# Patient Record
Sex: Male | Born: 1995
Health system: Southern US, Community
[De-identification: ages and names within clinical notes are randomized; demographics above are authoritative.]

## PROBLEM LIST (undated history)

## (undated) DIAGNOSIS — I38 Endocarditis, valve unspecified: Secondary | ICD-10-CM

## (undated) HISTORY — PX: WRIST SURGERY: SHX841

---

## 2015-05-20 ENCOUNTER — Emergency Department (HOSPITAL_COMMUNITY)
Admission: EM | Admit: 2015-05-20 | Discharge: 2015-05-20 | Disposition: A | Discharge status: Home / Self Care | Payer: BLUE CROSS/BLUE SHIELD | Attending: Emergency Medicine | Admitting: Emergency Medicine

## 2015-05-20 ENCOUNTER — Emergency Department (HOSPITAL_COMMUNITY): Payer: BLUE CROSS/BLUE SHIELD

## 2015-05-20 ENCOUNTER — Encounter (HOSPITAL_COMMUNITY): Payer: Self-pay

## 2015-05-20 DIAGNOSIS — S0012XS Contusion of left eyelid and periocular area, sequela: Secondary | ICD-10-CM | POA: Diagnosis not present

## 2015-05-20 DIAGNOSIS — R519 Headache, unspecified: Secondary | ICD-10-CM

## 2015-05-20 DIAGNOSIS — S0990XS Unspecified injury of head, sequela: Secondary | ICD-10-CM | POA: Diagnosis present

## 2015-05-20 DIAGNOSIS — S098XXS Other specified injuries of head, sequela: Secondary | ICD-10-CM

## 2015-05-20 DIAGNOSIS — W228XXS Striking against or struck by other objects, sequela: Secondary | ICD-10-CM | POA: Insufficient documentation

## 2015-05-20 DIAGNOSIS — F17200 Nicotine dependence, unspecified, uncomplicated: Secondary | ICD-10-CM | POA: Diagnosis not present

## 2015-05-20 DIAGNOSIS — R51 Headache: Secondary | ICD-10-CM

## 2015-05-20 MED ORDER — HYDROCODONE-ACETAMINOPHEN 5-325 MG PO TABS: 1.0000 | ORAL_TABLET | ORAL | Status: DC | PRN

## 2015-05-20 NOTE — Discharge Instructions (Signed)
Your vital signs are well within normal limits. Your CT scan reveals some soft tissue swelling on the left temporal area, and some inflammation present, but no fracture, no dislocation, no other evidence of major trauma. Please see your primary physician or your clinic physician for assistance with any additional pain management, And for recheck.
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
## 2018-07-05 ENCOUNTER — Encounter (HOSPITAL_COMMUNITY): Payer: Self-pay

## 2018-07-05 ENCOUNTER — Inpatient Hospital Stay (HOSPITAL_COMMUNITY)
Admission: EM | Admit: 2018-07-05 | Discharge: 2018-07-06 | DRG: 872 | Discharge status: Against Medical Advice | Payer: Self-pay | Attending: Internal Medicine | Admitting: Internal Medicine

## 2018-07-05 ENCOUNTER — Other Ambulatory Visit: Payer: Self-pay

## 2018-07-05 ENCOUNTER — Emergency Department (HOSPITAL_COMMUNITY): Payer: Self-pay

## 2018-07-05 DIAGNOSIS — F1990 Other psychoactive substance use, unspecified, uncomplicated: Secondary | ICD-10-CM | POA: Diagnosis present

## 2018-07-05 DIAGNOSIS — L03114 Cellulitis of left upper limb: Secondary | ICD-10-CM | POA: Diagnosis present

## 2018-07-05 DIAGNOSIS — Z1159 Encounter for screening for other viral diseases: Secondary | ICD-10-CM

## 2018-07-05 DIAGNOSIS — F17210 Nicotine dependence, cigarettes, uncomplicated: Secondary | ICD-10-CM | POA: Diagnosis present

## 2018-07-05 DIAGNOSIS — L02414 Cutaneous abscess of left upper limb: Secondary | ICD-10-CM | POA: Diagnosis present

## 2018-07-05 DIAGNOSIS — L03119 Cellulitis of unspecified part of limb: Secondary | ICD-10-CM

## 2018-07-05 DIAGNOSIS — F1910 Other psychoactive substance abuse, uncomplicated: Secondary | ICD-10-CM | POA: Diagnosis present

## 2018-07-05 DIAGNOSIS — Z91199 Patient's noncompliance with other medical treatment and regimen due to unspecified reason: Secondary | ICD-10-CM

## 2018-07-05 DIAGNOSIS — M2540 Effusion, unspecified joint: Secondary | ICD-10-CM

## 2018-07-05 DIAGNOSIS — L02419 Cutaneous abscess of limb, unspecified: Secondary | ICD-10-CM | POA: Diagnosis present

## 2018-07-05 DIAGNOSIS — A419 Sepsis, unspecified organism: Principal | ICD-10-CM | POA: Diagnosis present

## 2018-07-05 DIAGNOSIS — F1110 Opioid abuse, uncomplicated: Secondary | ICD-10-CM | POA: Diagnosis present

## 2018-07-05 DIAGNOSIS — Z8249 Family history of ischemic heart disease and other diseases of the circulatory system: Secondary | ICD-10-CM

## 2018-07-05 DIAGNOSIS — Z720 Tobacco use: Secondary | ICD-10-CM | POA: Diagnosis present

## 2018-07-05 DIAGNOSIS — L0291 Cutaneous abscess, unspecified: Secondary | ICD-10-CM

## 2018-07-05 DIAGNOSIS — F1410 Cocaine abuse, uncomplicated: Secondary | ICD-10-CM

## 2018-07-05 DIAGNOSIS — Z9119 Patient's noncompliance with other medical treatment and regimen: Secondary | ICD-10-CM

## 2018-07-05 LAB — RAPID URINE DRUG SCREEN, HOSP PERFORMED
Amphetamines: NOT DETECTED
Barbiturates: NOT DETECTED
Benzodiazepines: NOT DETECTED
Cocaine: POSITIVE — AB
Opiates: NOT DETECTED
Tetrahydrocannabinol: NOT DETECTED

## 2018-07-05 LAB — LACTIC ACID, PLASMA
Lactic Acid, Venous: 2.1 mmol/L (ref 0.5–1.9)
Lactic Acid, Venous: 1.1 mmol/L (ref 0.5–1.9)

## 2018-07-05 LAB — PROTIME-INR
INR: 1.1 (ref 0.8–1.2)
Prothrombin Time: 14.5 seconds (ref 11.4–15.2)

## 2018-07-05 LAB — CBC WITH DIFFERENTIAL/PLATELET
Abs Immature Granulocytes: 0.01 10*3/uL (ref 0.00–0.07)
Basophils Absolute: 0 10*3/uL (ref 0.0–0.1)
Basophils Relative: 0 %
Eosinophils Absolute: 0 10*3/uL (ref 0.0–0.5)
Eosinophils Relative: 0 %
HCT: 44.6 % (ref 39.0–52.0)
Hemoglobin: 14.3 g/dL (ref 13.0–17.0)
Immature Granulocytes: 0 %
Lymphocytes Relative: 7 %
Lymphs Abs: 0.3 10*3/uL — ABNORMAL LOW (ref 0.7–4.0)
MCH: 27.4 pg (ref 26.0–34.0)
MCHC: 32.1 g/dL (ref 30.0–36.0)
MCV: 85.4 fL (ref 80.0–100.0)
Monocytes Absolute: 0.3 10*3/uL (ref 0.1–1.0)
Monocytes Relative: 6 %
Neutro Abs: 4.1 10*3/uL (ref 1.7–7.7)
Neutrophils Relative %: 87 %
Platelets: 179 10*3/uL (ref 150–400)
RBC: 5.22 MIL/uL (ref 4.22–5.81)
RDW: 15.1 % (ref 11.5–15.5)
WBC: 4.7 10*3/uL (ref 4.0–10.5)
nRBC: 0 % (ref 0.0–0.2)

## 2018-07-05 LAB — URINALYSIS, ROUTINE W REFLEX MICROSCOPIC
Bilirubin Urine: NEGATIVE
Glucose, UA: NEGATIVE mg/dL
Hgb urine dipstick: NEGATIVE
Ketones, ur: NEGATIVE mg/dL
Leukocytes,Ua: NEGATIVE
Nitrite: NEGATIVE
Protein, ur: NEGATIVE mg/dL
Specific Gravity, Urine: 1.013 (ref 1.005–1.030)
pH: 6 (ref 5.0–8.0)

## 2018-07-05 LAB — COMPREHENSIVE METABOLIC PANEL
ALT: 13 U/L (ref 0–44)
AST: 16 U/L (ref 15–41)
Albumin: 3.9 g/dL (ref 3.5–5.0)
Alkaline Phosphatase: 78 U/L (ref 38–126)
Anion gap: 12 (ref 5–15)
BUN: 9 mg/dL (ref 6–20)
CO2: 23 mmol/L (ref 22–32)
Calcium: 8.8 mg/dL — ABNORMAL LOW (ref 8.9–10.3)
Chloride: 100 mmol/L (ref 98–111)
Creatinine, Ser: 0.91 mg/dL (ref 0.61–1.24)
GFR calc Af Amer: >60 mL/min (ref >60)
GFR calc non Af Amer: >60 mL/min (ref >60)
Glucose, Bld: 125 mg/dL — ABNORMAL HIGH (ref 70–99)
Potassium: 3.4 mmol/L — ABNORMAL LOW (ref 3.5–5.1)
Sodium: 135 mmol/L (ref 135–145)
Total Bilirubin: 0.3 mg/dL (ref 0.3–1.2)
Total Protein: 7.1 g/dL (ref 6.5–8.1)

## 2018-07-05 LAB — SARS CORONAVIRUS 2 BY RT PCR (HOSPITAL ORDER, PERFORMED IN ~~LOC~~ HOSPITAL LAB): SARS Coronavirus 2: NEGATIVE

## 2018-07-05 MED ORDER — SODIUM CHLORIDE 0.9 % IV SOLN
1500.0000 mg | Freq: Once | INTRAVENOUS | Status: AC
  Administered 2018-07-05: 1500 mg via INTRAVENOUS
  Filled 2018-07-05: qty 1500

## 2018-07-05 MED ORDER — VANCOMYCIN HCL 1.25 G IV SOLR
1250.0000 mg | Freq: Two times a day (BID) | INTRAVENOUS | Status: DC
  Administered 2018-07-06: 1250 mg via INTRAVENOUS
  Filled 2018-07-05 (×7): qty 1250

## 2018-07-05 MED ORDER — SODIUM CHLORIDE 0.9 % IV BOLUS
1000.0000 mL | Freq: Once | INTRAVENOUS | Status: AC
  Administered 2018-07-05: 1000 mL via INTRAVENOUS

## 2018-07-05 MED ORDER — SODIUM CHLORIDE 0.9 % IV BOLUS: 1000.0000 mL | Freq: Once | INTRAVENOUS | Status: DC

## 2018-07-05 MED ORDER — FENTANYL CITRATE (PF) 100 MCG/2ML IJ SOLN
50.0000 ug | Freq: Once | INTRAMUSCULAR | Status: AC
  Administered 2018-07-05: 50 ug via INTRAVENOUS
  Filled 2018-07-05: qty 2

## 2018-07-05 MED ORDER — VANCOMYCIN HCL 10 G IV SOLR
INTRAVENOUS | Status: AC
  Filled 2018-07-05: qty 1500

## 2018-07-05 MED ORDER — METHOCARBAMOL 500 MG PO TABS
750.0000 mg | ORAL_TABLET | Freq: Four times a day (QID) | ORAL | Status: DC
  Administered 2018-07-06: 750 mg via ORAL
  Filled 2018-07-05: qty 2

## 2018-07-05 MED ORDER — HYDROXYZINE HCL 25 MG PO TABS
25.0000 mg | ORAL_TABLET | Freq: Three times a day (TID) | ORAL | Status: DC
  Administered 2018-07-06: 25 mg via ORAL
  Filled 2018-07-05: qty 1

## 2018-07-05 MED ORDER — GABAPENTIN 300 MG PO CAPS
300.0000 mg | ORAL_CAPSULE | Freq: Three times a day (TID) | ORAL | Status: DC
  Administered 2018-07-06: 01:00:00 300 mg via ORAL
  Filled 2018-07-05: qty 1

## 2018-07-05 MED ORDER — TRAZODONE HCL 50 MG PO TABS
100.0000 mg | ORAL_TABLET | Freq: Every day | ORAL | Status: DC
  Administered 2018-07-06: 01:00:00 100 mg via ORAL
  Filled 2018-07-05: qty 2

## 2018-07-05 MED ORDER — VANCOMYCIN HCL 1.5 G IV SOLR
1500.0000 mg | Freq: Once | INTRAVENOUS | Status: DC
  Filled 2018-07-05: qty 1500

## 2018-07-05 MED ORDER — SODIUM CHLORIDE 0.9 % IV SOLN
INTRAVENOUS | Status: DC
  Administered 2018-07-06 (×2): via INTRAVENOUS

## 2018-07-05 MED ORDER — SODIUM CHLORIDE 0.9% FLUSH: 3.0000 mL | Freq: Once | INTRAVENOUS | Status: DC

## 2018-07-05 MED ORDER — KETOROLAC TROMETHAMINE 30 MG/ML IJ SOLN
30.0000 mg | Freq: Four times a day (QID) | INTRAMUSCULAR | Status: DC
  Administered 2018-07-06 (×2): 30 mg via INTRAVENOUS
  Filled 2018-07-05 (×2): qty 1

## 2018-07-05 NOTE — ED Notes (Signed)
Pt stating that he does not want to get admitted. edp and rn's explained to pt the risks of not being admitted. Pt agreed to stay.  rn to room to collect covid swab sample pt refused. rn informed pt it is hospital policy and pt cannot be admitted without it. c-rn to room to discuss with pt. edp aware of issue

## 2018-07-05 NOTE — ED Triage Notes (Signed)
Pt presents to ED with complaints of abscess on left upper forearm x 1 week.

## 2018-07-05 NOTE — ED Notes (Signed)
Date and time results received: 07/05/18 9:36 PM  (use smartphrase ".now" to insert current time)  Test: lactic Critical Value: 2.1  Name of Provider Notified: cook   Orders Received? Or Actions Taken?:  None at this time

## 2018-07-05 NOTE — ED Notes (Signed)
At bedside pt states he does not think he wants to be admitted as discussed with EDP, discussed risks and benefits of obtaining treatment, pt reports he will stay for admission

## 2018-07-05 NOTE — ED Notes (Signed)
Called ac for abx

## 2018-07-05 NOTE — ED Provider Notes (Signed)
[facility] EMERGENCY DEPARTMENT Provider Note   CSN: [id number] Arrival date & time: 07/05/18  1956    History   Chief Complaint Chief Complaint  Patient presents with  . Abscess    HPI Timothy RoughJalen Fina is a 23 y.o. male.     Abscess left upper extremity for several days.  Patient denies intravenous drug usage.  No known health problems.  Low-grade fever.  No chills.  Severity is moderate to severe.  Palpation makes pain worse     History reviewed. No pertinent past medical history.  There are no active problems to display for this patient.   Past Surgical History:  Procedure Laterality Date  . WRIST SURGERY          Home Medications    Prior to Admission medications   Not on File    Family History No family history on file.  Social History Social History   Tobacco Use  . Smoking status: Current Every Day Smoker    Packs/day: 0.50    Types: Cigarettes  . Smokeless tobacco: Never Used  Substance Use Topics  . Alcohol use: No  . Drug use: No     Allergies   Patient has no known allergies.   Review of Systems Review of Systems  All other systems reviewed and are negative.    Physical Exam Updated Vital Signs BP 115/77   Pulse (!) 107   Temp (!) 100.4 F (38 C) (Oral)   Resp 20   Ht 6' (1.829 m)   Wt 68 kg   SpO2 99%   BMI 20.34 kg/m   Physical Exam Vitals signs and nursing note reviewed.  Constitutional:      Appearance: He is well-developed.  HENT:     Head: Normocephalic and atraumatic.  Eyes:     Conjunctiva/sclera: Conjunctivae normal.  Neck:     Musculoskeletal: Neck supple.  Cardiovascular:     Rate and Rhythm: Normal rate and regular rhythm.  Pulmonary:     Effort: Pulmonary effort is normal.     Breath sounds: Normal breath sounds.  Abdominal:     General: Bowel sounds are normal.     Palpations: Abdomen is soft.  Musculoskeletal: Normal range of motion.  Skin:    Comments: Left upper extremity: Obvious abscess  approximately 2.5 cm in diameter at the distal medial aspect of the humerus  Neurological:     Mental Status: He is alert and oriented to person, place, and time.  Psychiatric:        Behavior: Behavior normal.      ED Treatments / Results  Labs (all labs ordered are listed, but only abnormal results are displayed) Labs Reviewed  COMPREHENSIVE METABOLIC PANEL - Abnormal; Notable for the following components:      Result Value   Potassium 3.4 (*)    Glucose, Bld 125 (*)    Calcium 8.8 (*)    All other components within normal limits  LACTIC ACID, PLASMA - Abnormal; Notable for the following components:   Lactic Acid, Venous 2.1 (*)    All other components within normal limits  CBC WITH DIFFERENTIAL/PLATELET - Abnormal; Notable for the following components:   Lymphs Abs 0.3 (*)    All other components within normal limits  CULTURE, BLOOD (ROUTINE X 2)  CULTURE, BLOOD (ROUTINE X 2)  SARS CORONAVIRUS 2 (HOSPITAL ORDER, PERFORMED IN [facility])  PROTIME-INR  LACTIC ACID, PLASMA  URINALYSIS, ROUTINE W REFLEX MICROSCOPIC    EKG  None  Radiology Dg Chest 2 View  Result Date: 07/05/2018 CLINICAL DATA:  Sepsis EXAM: CHEST - 2 VIEW COMPARISON:  None. FINDINGS: The heart size and mediastinal contours are within normal limits. Both lungs are clear. The visualized skeletal structures are unremarkable. IMPRESSION: No active cardiopulmonary disease. Electronically Signed   By: Katherine Mantle M.D.   On: 07/05/2018 21:25    Procedures Procedures (including critical care time)  Medications Ordered in ED Medications  sodium chloride flush (NS) 0.9 % injection 3 mL ( Intravenous Canceled Entry 07/05/18 2101)  vancomycin (VANCOCIN) 1,500 mg in sodium chloride 0.9 % 500 mL IVPB (1,500 mg Intravenous New Bag/Given 07/05/18 2113)  sodium chloride 0.9 % bolus 1,000 mL (1,000 mLs Intravenous New Bag/Given 07/05/18 2051)  sodium chloride 0.9 % bolus 1,000 mL (1,000 mLs Intravenous  New Bag/Given 07/05/18 2051)  fentaNYL (SUBLIMAZE) injection 50 mcg (50 mcg Intravenous Given 07/05/18 2112)     Initial Impression / Assessment and Plan / ED Course  I have reviewed the triage vital signs and the nursing notes.  Pertinent labs & imaging results that were available during my care of the patient were reviewed by me and considered in my medical decision making (see chart for details).        Obvious abscess in the left upper extremity.  Patient is tachycardic and febrile.  IV fluids, IV vancomycin, admit to general medicine.  Final Clinical Impressions(s) / ED Diagnoses   Final diagnoses:  Abscess    ED Discharge Orders    None       Donnetta Hutching, MD 07/05/18 2151

## 2018-07-05 NOTE — H&P (Signed)
Patient Demographics:    Timothy Christensen, is a 23 y.o. male  MRN: [id number]   DOB - September 20, 1995  Admit Date - 07/05/2018  Outpatient Primary MD for the patient is Patient, No Pcp Per   Assessment & Plan:    Principal Problem:   Cellulitis and abscess of upper extremity Active Problems:   IVDU (intravenous drug user)/Opiates/Cocaine   Polysubstance abuse /Cocaine and Opiates   Tobacco abuse    1)Lt UE/Antecubital Area Abscess/Cellulitis----continue IV vancomycin pending MRSA PCR screen and blood culture results..... Fever 100.4 and tachycardia (HR > 120 bpm) noted, lactic acid 2.1.Marland Kitchen.-  Surgical consult for possible I&D from Dr. Lovell Sheehan requested, patient may need soft tissue ultrasound.... If blood cultures are positive patient will need echo given history of IVDU to rule out endocarditis... IV Toradol was ordered for pain control, judicious use of opiates  2)Sepsis----secondary to #1 above, aggressive IV hydration, initial lactic acid elevated at 2.1, fevers and tachycardia noted,, repeat lactic acid normalized post hydration, continue IV vancomycin pending culture results and MRSA PCR screen,  If blood cultures are positive patient will need echo given history of IVDU to rule out endocarditis  3)Polysubstance abuse--- UDS positive for cocaine.... Patient admits to IV drug use including injecting Suboxone IV..... Be judicious with opiates and benzos... Okay to give gabapentin, methocarbamol and hydroxyzine as well as trazodone to help blunt withdrawal effects  4)Tobacco abuse-----smoking cessation advised, patient not interested in quitting smoking, give nicotine patch   With History of - Reviewed by me  History reviewed. No pertinent past medical history.    Past Surgical History:  Procedure Laterality Date  .  WRIST SURGERY        Chief Complaint  Patient presents with  . Abscess      HPI:    Timothy Christensen  is a 24 y.o. male with past medical history relevant for polysubstance abuse including IVDU, cocaine and opiate abuse, tobacco abuse who presents to the ED with several days of left antecubital fossa area swelling, warmth, tenderness and increasing pain...  Patient admits to IV drug use including self injecting into his left antecubital fossa..... No injury recalled  Patient also had fevers and chills... No night sweats... No chest pains no palpitations no dizziness No URI symptoms no cough In ED... Patient is found to be tachycardic with heart rate up to the 120s, febrile with temp of 100.4,... No tachypnea.. No hypoxia.. No leukocytosis... Lactic acid is elevated at 2.1 He received IV fluid boluses per sepsis protocol.... Repeat lactic acid after IV fluids is 1.1... Blood cultures obtained.... EDP initiated IV vancomycin therapy Chest x-ray without acute findings  UDS positive for cocaine  Patient admits to IV drug use including injecting Suboxone IV.    Review of systems:    In addition to the HPI above,   A full Review of  Systems was done, all other systems reviewed are negative except as noted above in  HPI , .    Social History:  Reviewed by me    Social History   Tobacco Use  . Smoking status: Current Every Day Smoker    Packs/day: 0.50    Types: Cigarettes  . Smokeless tobacco: Never Used  Substance Use Topics  . Alcohol use: No       Family History :  Reviewed by me  HTN  Home Medications:   Prior to Admission medications   Not on File     Allergies:    No Known Allergies   Physical Exam:   Vitals  Blood pressure 110/74, pulse (!) 107, temperature (!) 100.4 F (38 C), temperature source Oral, resp. rate 20, height 6' (1.829 m), weight 68 kg, SpO2 99 %.  Physical Examination: General appearance - alert, well appearing, and in no  distress Mental status - alert, oriented to person, place, and time, patient is somewhat anxious Eyes - sclera anicteric Neck - supple, no JVD elevation , Chest - clear  to auscultation bilaterally, symmetrical air movement,  Heart - S1 and S2 normal, regular  Abdomen - soft, nontender, nondistended, no masses or organomegaly Neurological - screening mental status exam normal, neck supple without rigidity, cranial nerves II through XII intact, DTR's normal and symmetric Extremities - no pedal edema noted, intact peripheral pulses  Skin ---- left antecubital fossa cellulitis and abscess as noted-please see photos Below- Media Information   Document Information   Photos    07/05/2018 22:19  Attached To:  Hospital Encounter on 07/05/18  Source Information   Shon HaleEmokpae, Julisa Flippo, MD  [facility]    Media Information   Document Information   Photos    07/05/2018 22:19  Attached To:  Hospital Encounter on 07/05/18  Source Information   Shon HaleEmokpae, Helon Wisinski, MD  [facility]      Data Review:    CBC Recent Labs  Lab 07/05/18 2024  WBC 4.7  HGB 14.3  HCT 44.6  PLT 179  MCV 85.4  MCH 27.4  MCHC 32.1  RDW 15.1  LYMPHSABS 0.3*  MONOABS 0.3  EOSABS 0.0  BASOSABS 0.0   ------------------------------------------------------------------------------------------------------------------  Chemistries  Recent Labs  Lab 07/05/18 2024  NA 135  K 3.4*  CL 100  CO2 23  GLUCOSE 125*  BUN 9  CREATININE 0.91  CALCIUM 8.8*  AST 16  ALT 13  ALKPHOS 78  BILITOT 0.3   ------------------------------------------------------------------------------------------------------------------ estimated creatinine clearance is 119.4 mL/min (by C-G formula based on SCr of 0.91 mg/dL). ------------------------------------------------------------------------------------------------------------------ No results for input(s): TSH, T4TOTAL, T3FREE, THYROIDAB in the last 72  hours.  Invalid input(s): FREET3   Coagulation profile Recent Labs  Lab 07/05/18 2024  INR 1.1   --------------------------------------------------------------------------------------------------------------  Urinalysis    Component Value Date/Time   COLORURINE YELLOW 07/05/2018 2024   APPEARANCEUR CLEAR 07/05/2018 2024   LABSPEC 1.013 07/05/2018 2024   PHURINE 6.0 07/05/2018 2024   GLUCOSEU NEGATIVE 07/05/2018 2024   HGBUR NEGATIVE 07/05/2018 2024   BILIRUBINUR NEGATIVE 07/05/2018 2024   KETONESUR NEGATIVE 07/05/2018 2024   PROTEINUR NEGATIVE 07/05/2018 2024   NITRITE NEGATIVE 07/05/2018 2024   LEUKOCYTESUR NEGATIVE 07/05/2018 2024    ----------------------------------------------------------------------------------------------------------------   Imaging Results:    Dg Chest 2 View  Result Date: 07/05/2018 CLINICAL DATA:  Sepsis EXAM: CHEST - 2 VIEW COMPARISON:  None. FINDINGS: The heart size and mediastinal contours are within normal limits. Both lungs are clear. The visualized skeletal structures are unremarkable. IMPRESSION: No active cardiopulmonary disease. Electronically Signed   By:  Katherine Mantle M.D.   On: 07/05/2018 21:25    Radiological Exams on Admission: Dg Chest 2 View  Result Date: 07/05/2018 CLINICAL DATA:  Sepsis EXAM: CHEST - 2 VIEW COMPARISON:  None. FINDINGS: The heart size and mediastinal contours are within normal limits. Both lungs are clear. The visualized skeletal structures are unremarkable. IMPRESSION: No active cardiopulmonary disease. Electronically Signed   By: Katherine Mantle M.D.   On: 07/05/2018 21:25   DVT Prophylaxis -SCD /Heaprin AM Labs Ordered, also please review Full Orders  Family Communication: Admission, patients condition and plan of care including tests being ordered have been discussed with the patient  who indicate understanding and agree with the plan   Code Status - Full Code  Likely DC to  home  Condition    Stable  Shon Hale M.D on 07/05/2018 at 11:59 PM Go to [email or web address] -  for contact info  [facility] - Office  [phone]

## 2018-07-05 NOTE — ED Notes (Signed)
RN AC at bedside to discuss patients refusal to have covid 19 testing performed prior to admissions. Patient states he will complete antibiotics in the ED and then come back through the ED daily to get his next doses. RN [facility] attempted to discuss the importance of staying to have proper dosing per pharmacy based on lab results. Patients continue to state that he will not have Covid swab performed because "people I know say it hurts". Dr. Adriana Simas made aware of patients refusal to have Covid swab performed. Dr. Adriana Simas at bedside discussing options with patient.

## 2018-07-05 NOTE — ED Notes (Signed)
Discussed with pt the hospital policy and need for COVID test before admission as RN states pt refuses test, discussed with pt the testing procedure and what he could expect and showed pt the test swab, pt states he has heard the test hurts and he does not want it, discussed sepsis with pt and risks and benefits of staying for needed treatment, pt reports he will need additional time to think about whether he will allow test to be administered, discussed with AC-she will speak with pt soon

## 2018-07-05 NOTE — Progress Notes (Signed)
Pharmacy Antibiotic Note  Timothy Christensen is a 23 y.o. male admitted on 07/05/2018 with sepsis/abscess.  Pharmacy has been consulted for Vancomycin dosing.  Plan: Vancomycin 1250 mg IV every 12 hours.  Goal trough 15-20 mcg/mL.  Monitor labs, c/s, and vanco levels as indicated.  Height: 6' (182.9 cm) Weight: 150 lb (68 kg) IBW/kg (Calculated) : 77.6  Temp (24hrs), Avg:100.4 F (38 C), Min:100.4 F (38 C), Max:100.4 F (38 C)  Recent Labs  Lab 07/05/18 2024  WBC 4.7  CREATININE 0.91  LATICACIDVEN 2.1*    Estimated Creatinine Clearance: 119.4 mL/min (by C-G formula based on SCr of 0.91 mg/dL).    No Known Allergies  Antimicrobials this admission: Vanco 5/21 >>     Dose adjustments this admission: N/A  Microbiology results: 5/21 BCx: pending  5/21 MRSA PCR: pending  Thank you for allowing pharmacy to be a part of this patient's care.  Tad Moore 07/05/2018 10:59 PM

## 2018-07-06 ENCOUNTER — Encounter: Payer: Self-pay | Admitting: Orthopedic Surgery

## 2018-07-06 ENCOUNTER — Inpatient Hospital Stay (HOSPITAL_COMMUNITY): Payer: Self-pay

## 2018-07-06 ENCOUNTER — Encounter (HOSPITAL_COMMUNITY): Payer: Self-pay | Admitting: *Deleted

## 2018-07-06 DIAGNOSIS — L0291 Cutaneous abscess, unspecified: Secondary | ICD-10-CM

## 2018-07-06 DIAGNOSIS — F1910 Other psychoactive substance abuse, uncomplicated: Secondary | ICD-10-CM

## 2018-07-06 DIAGNOSIS — F1990 Other psychoactive substance use, unspecified, uncomplicated: Secondary | ICD-10-CM

## 2018-07-06 DIAGNOSIS — Z91199 Patient's noncompliance with other medical treatment and regimen due to unspecified reason: Secondary | ICD-10-CM

## 2018-07-06 DIAGNOSIS — Z9119 Patient's noncompliance with other medical treatment and regimen: Secondary | ICD-10-CM

## 2018-07-06 DIAGNOSIS — Z720 Tobacco use: Secondary | ICD-10-CM

## 2018-07-06 DIAGNOSIS — F1410 Cocaine abuse, uncomplicated: Secondary | ICD-10-CM

## 2018-07-06 LAB — CBC
HCT: 42.4 % (ref 39.0–52.0)
Hemoglobin: 13.5 g/dL (ref 13.0–17.0)
MCH: 27.7 pg (ref 26.0–34.0)
MCHC: 31.8 g/dL (ref 30.0–36.0)
MCV: 87.1 fL (ref 80.0–100.0)
Platelets: 156 10*3/uL (ref 150–400)
RBC: 4.87 MIL/uL (ref 4.22–5.81)
RDW: 15.3 % (ref 11.5–15.5)
WBC: 3.7 10*3/uL — ABNORMAL LOW (ref 4.0–10.5)
nRBC: 0 % (ref 0.0–0.2)

## 2018-07-06 LAB — BASIC METABOLIC PANEL
Anion gap: 5 (ref 5–15)
BUN: 8 mg/dL (ref 6–20)
CO2: 26 mmol/L (ref 22–32)
Calcium: 8.2 mg/dL — ABNORMAL LOW (ref 8.9–10.3)
Chloride: 110 mmol/L (ref 98–111)
Creatinine, Ser: 0.74 mg/dL (ref 0.61–1.24)
GFR calc Af Amer: >60 mL/min (ref >60)
GFR calc non Af Amer: >60 mL/min (ref >60)
Glucose, Bld: 103 mg/dL — ABNORMAL HIGH (ref 70–99)
Potassium: 3.9 mmol/L (ref 3.5–5.1)
Sodium: 141 mmol/L (ref 135–145)

## 2018-07-06 LAB — GLUCOSE, CAPILLARY: Glucose-Capillary: 125 mg/dL — ABNORMAL HIGH (ref 70–99)

## 2018-07-06 MED ORDER — HEPARIN SODIUM (PORCINE) 5000 UNIT/ML IJ SOLN: 5000.0000 [IU] | Freq: Three times a day (TID) | INTRAMUSCULAR | Status: DC

## 2018-07-06 MED ORDER — POLYETHYLENE GLYCOL 3350 17 G PO PACK: 17.0000 g | PACK | Freq: Every day | ORAL | Status: DC | PRN

## 2018-07-06 MED ORDER — ONDANSETRON HCL 4 MG/2ML IJ SOLN: 4.0000 mg | Freq: Four times a day (QID) | INTRAMUSCULAR | Status: DC | PRN

## 2018-07-06 MED ORDER — SODIUM CHLORIDE 0.9 % IV SOLN: 250.0000 mL | INTRAVENOUS | Status: DC | PRN

## 2018-07-06 MED ORDER — HYDROXYZINE HCL 50 MG/ML IM SOLN: 50.0000 mg | Freq: Once | INTRAMUSCULAR | Status: DC

## 2018-07-06 MED ORDER — DOXYCYCLINE HYCLATE 100 MG PO CAPS: 100.0000 mg | ORAL_CAPSULE | Freq: Two times a day (BID) | ORAL | 0 refills | Status: DC

## 2018-07-06 MED ORDER — SODIUM CHLORIDE 0.9% FLUSH: 3.0000 mL | Freq: Two times a day (BID) | INTRAVENOUS | Status: DC

## 2018-07-06 MED ORDER — OXYCODONE HCL 5 MG PO TABS
10.0000 mg | ORAL_TABLET | ORAL | Status: DC | PRN
  Administered 2018-07-06 (×2): 10 mg via ORAL
  Filled 2018-07-06 (×2): qty 2

## 2018-07-06 MED ORDER — TRAZODONE HCL 50 MG PO TABS: 50.0000 mg | ORAL_TABLET | Freq: Every evening | ORAL | Status: DC | PRN

## 2018-07-06 MED ORDER — FENTANYL CITRATE (PF) 100 MCG/2ML IJ SOLN
50.0000 ug | INTRAMUSCULAR | Status: DC | PRN
  Administered 2018-07-06: 50 ug via INTRAVENOUS
  Filled 2018-07-06: qty 2

## 2018-07-06 MED ORDER — ACETAMINOPHEN 650 MG RE SUPP: 650.0000 mg | Freq: Four times a day (QID) | RECTAL | Status: DC | PRN

## 2018-07-06 MED ORDER — SODIUM CHLORIDE 0.9% FLUSH: 3.0000 mL | INTRAVENOUS | Status: DC | PRN

## 2018-07-06 MED ORDER — NICOTINE 14 MG/24HR TD PT24
14.0000 mg | MEDICATED_PATCH | Freq: Every day | TRANSDERMAL | Status: DC
  Administered 2018-07-06: 14 mg via TRANSDERMAL
  Filled 2018-07-06 (×2): qty 1

## 2018-07-06 MED ORDER — ALBUTEROL SULFATE (2.5 MG/3ML) 0.083% IN NEBU: 2.5000 mg | INHALATION_SOLUTION | RESPIRATORY_TRACT | Status: DC | PRN

## 2018-07-06 MED ORDER — ONDANSETRON HCL 4 MG PO TABS: 4.0000 mg | ORAL_TABLET | Freq: Four times a day (QID) | ORAL | Status: DC | PRN

## 2018-07-06 MED ORDER — ACETAMINOPHEN 325 MG PO TABS: 650.0000 mg | ORAL_TABLET | Freq: Four times a day (QID) | ORAL | Status: DC | PRN

## 2018-07-06 NOTE — Plan of Care (Signed)
progressing 

## 2018-07-06 NOTE — Discharge Summary (Signed)
Physician Discharge Summary  Timothy Christensen ZOX:[id number] DOB: 13-Jan-1996 DOA: 07/05/2018  PCP: Patient, No Pcp Per  Admit date: 07/05/2018 Discharge date: 07/06/2018  Admitted From: home Disposition:  AGAINST MEDICAL ADVICE   Discharge Condition: AGAINST MEDICAL ADVICE CODE STATUS: FULL    Brief/Interim Summary: 23 year old male with a history of polysubstance abuse including cocaine, tobacco, and buprenorphine presenting with 2 to 3-day history of worsening erythema, edema, and pain in his left antecubital fossa.  The patient states that he last injected intravenous drugs approximately 2 to 3 weeks prior to this admission.  He states that he last snorted cocaine approximately weeks prior to this admission.  He initially noted a knot in his left antecubital fossa approximately 2 weeks prior to this admission which has gradually increased in size.  He has had some subjective fevers and chills but denies any headache, neck pain, chest pain, shortness breath, coughing, nausea, vomiting, diarrhea, domino pain. In the emergency department, the patient temperature up to 100.4 F.  He was hemodynamically stable saturating 100% room air.  BMP, CBC, and LFTs were essentially unremarkable.  Urine drug screen was positive for cocaine. The patient was started on vancomycin IV.  The patient was started on intravenous fentanyl for pain control.  Hand surgery was consulted to assist.  During the hospitalization, the patient was belligerent and derogatory to the nursing staff as well as myself.  He felt that he was not getting the appropriate attention nor the proper medical care.  Ultimately, the patient decided to leave AGAINST MEDICAL ADVICE. In speaking with Timothy Christensen, Timothy Christensen has demonstrated the ability to understand his medical condition(s) which include cellulitis and abscess of left upper extremity.  Timothy Christensen has demonstrated the ability to appreciate how treatment for cellulitis and abscess of  left upper extremity will be beneficial.   Timothy Christensen has also demonstrated the ability to understand and appreciate how refusal of treatement for cellulitis and abscess of left upper extremity could result in harm, repeat hospitalization, and possibly death.  Timothy Christensen demonstrates the ability to reason through the risks and benefits of the proposed treatment.  Finally, Timothy Christensen is able to clearly communicate his/her choice.   Discharge Diagnoses:  Left upper extremity abscess/cellulitis -Concerned about fluctuant area in the left antecubital fossa -Consulted hand surgery, Dr. Romeo Apple -Spoke with Dr. Romeo Apple who requested to make the patient n.p.o. for possible surgery -Continue vancomycin -pt decided to leave against medical advice before MRI could be performed -Rx for doxycycline was sent to his pharmacy of record prior to patient leaving  Sepsis -Present at the time of admission -Patient presented with fever, tachycardia, and elevated lactic acid -Source of cellulitis -Lactic acid peaked 2.1 -Continue IV fluids -Follow blood cultures  Polysubstance abuse -Including cocaine, tobacco, and buprenorphine  Tobacco abuse -I have discussed tobacco cessation with the patient.  I have counseled the patient regarding the negative impacts of continued tobacco use including but not limited to lung cancer, COPD, and cardiovascular disease.  I have discussed alternatives to tobacco and modalities that may help facilitate tobacco cessation including but not limited to biofeedback, hypnosis, and medications.  Total time spent with tobacco counseling was 4 minutes.  Noncompliance with medical therapy -I discussed the medical plan with the patient including continued intravenous antibiotics and consulting hand surgery and pain control -I explained to the patient that I spoke with the hand surgeon, Dr. Romeo Apple who will see the patient later today and requested the patient be n.p.o. for  possible surgery -The patient adamantly states, " if you cannot tell me that there will definitely be surgery, I am going to eat.  I am not going to wait until this afternoon.  By that time, the food will be out of my stomach.  I have had surgery done before, and this is not real surgery.  They are just going to numb me up and cut and drain it so I do not understand why I cannot eat." -I kindly explained to the patient that I am not the surgeon and the decision for any type of invasive procedure would be up to the surgeon after he evaluates the patient -Patient further states, "I'm not going to wait 4 or 5 or 6 hours to eat if you are only telling me that I 'might' go to surgery and it is not a definite." -I further explained to the patient that I understand that it is a difficult situation to remain npo, but we all work together as a team and we want to get him better as fast as we can and eating may delay any type of procedure.  I assured him that he may be allowed to eat and drink once Dr. Romeo AppleHarrison evaluates him and further clarifies the need for any invasive procedure. -patient then states, "I don't care what you say.  I'm going to eat."    Discharge Instructions   Allergies as of 07/06/2018   No Known Allergies     Medication List    TAKE these medications   doxycycline 100 MG capsule Commonly known as:  Vibramycin Take 1 capsule (100 mg total) by mouth 2 (two) times daily.       No Known Allergies  Consultations:  Hand surgery   Procedures/Studies: Dg Chest 2 View  Result Date: 07/05/2018 CLINICAL DATA:  Sepsis EXAM: CHEST - 2 VIEW COMPARISON:  None. FINDINGS: The heart size and mediastinal contours are within normal limits. Both lungs are clear. The visualized skeletal structures are unremarkable. IMPRESSION: No active cardiopulmonary disease. Electronically Signed   By: Katherine Mantlehristopher  Green M.D.   On: 07/05/2018 21:25         Discharge Exam: Vitals:   07/06/18  0019 07/06/18 1015  BP: 122/82   Pulse: 86   Resp: 16   Temp: 98.8 F (37.1 C)   SpO2: 100% 99%   Vitals:   07/05/18 2330 07/06/18 0019 07/06/18 0020 07/06/18 1015  BP: 105/70 122/82    Pulse:  86    Resp: 18 16    Temp:  98.8 F (37.1 C)    TempSrc:  Oral    SpO2: 100% 100%  99%  Weight:   68 kg   Height:   6' (1.829 m)     General: Pt is alert, awake, not in acute distress Cardiovascular: RRR, S1/S2 +, no rubs, no gallops Respiratory: CTA bilaterally, no wheezing, no rhonchi Abdominal: Soft, NT, ND, bowel sounds +    The results of significant diagnostics from this hospitalization (including imaging, microbiology, ancillary and laboratory) are listed below for reference.    Significant Diagnostic Studies: Dg Chest 2 View  Result Date: 07/05/2018 CLINICAL DATA:  Sepsis EXAM: CHEST - 2 VIEW COMPARISON:  None. FINDINGS: The heart size and mediastinal contours are within normal limits. Both lungs are clear. The visualized skeletal structures are unremarkable. IMPRESSION: No active cardiopulmonary disease. Electronically Signed   By: Katherine Mantlehristopher  Green M.D.   On: 07/05/2018 21:25     Microbiology: Recent Results (from  the past 240 hour(s))  Culture, blood (Routine x 2)     Status: None (Preliminary result)   Collection Time: 07/05/18  8:24 PM  Result Value Ref Range Status   Specimen Description BLOOD  Final   Special Requests NONE  Final   Culture   Final    NO GROWTH < 12 HOURS Performed at [facility], [address], [place], [place] [address]    Report Status PENDING  Incomplete  Culture, blood (Routine x 2)     Status: None (Preliminary result)   Collection Time: 07/05/18  8:29 PM  Result Value Ref Range Status   Specimen Description BLOOD  Final   Special Requests NONE  Final   Culture   Final    NO GROWTH < 12 HOURS Performed at [facility], [address] [place], [place] [address]    Report Status PENDING  Incomplete  SARS Coronavirus 2 (CEPHEID -  Performed in [facility] hospital lab), Hosp Order     Status: None   Collection Time: 07/05/18  9:01 PM  Result Value Ref Range Status   SARS Coronavirus 2 NEGATIVE NEGATIVE Final    Comment: (NOTE) If result is NEGATIVE SARS-CoV-2 target nucleic acids are NOT DETECTED. The SARS-CoV-2 RNA is generally detectable in upper and lower  respiratory specimens during the acute phase of infection. The lowest  concentration of SARS-CoV-2 viral copies this assay can detect is 250  copies / mL. A negative result does not preclude SARS-CoV-2 infection  and should not be used as the sole basis for treatment or other  patient management decisions.  A negative result may occur with  improper specimen collection / handling, submission of specimen other  than nasopharyngeal swab, presence of viral mutation(s) within the  areas targeted by this assay, and inadequate number of viral copies  (<250 copies / mL). A negative result must be combined with clinical  observations, patient history, and epidemiological information. If result is POSITIVE SARS-CoV-2 target nucleic acids are DETECTED. The SARS-CoV-2 RNA is generally detectable in upper and lower  respiratory specimens dur ing the acute phase of infection.  Positive  results are indicative of active infection with SARS-CoV-2.  Clinical  correlation with patient history and other diagnostic information is  necessary to determine patient infection status.  Positive results do  not rule out bacterial infection or co-infection with other viruses. If result is PRESUMPTIVE POSTIVE SARS-CoV-2 nucleic acids MAY BE PRESENT.   A presumptive positive result was obtained on the submitted specimen  and confirmed on repeat testing.  While 2019 novel coronavirus  (SARS-CoV-2) nucleic acids may be present in the submitted sample  additional confirmatory testing may be necessary for epidemiological  and / or clinical management purposes  to differentiate between   SARS-CoV-2 and other Sarbecovirus currently known to infect humans.  If clinically indicated additional testing with an alternate test  methodology [phone]) is advised. The SARS-CoV-2 RNA is generally  detectable in upper and lower respiratory sp ecimens during the acute  phase of infection. The expected result is Negative. Fact Sheet for Patients:  BoilerBrush.com.cy Fact Sheet for Healthcare Providers: https://pope.com/ This test is not yet approved or cleared by the [place] FDA and has been authorized for detection and/or diagnosis of SARS-CoV-2 by FDA under an Emergency Use Authorization (EUA).  This EUA will remain in effect (meaning this test can be used) for the duration of the COVID-19 declaration under Section 564(b)(1) of the Act, 21 U.S.C. section 360bbb-3(b)(1),  unless the authorization is terminated or revoked sooner. Performed at [facility], [address] [place], [place] [address]      Labs: Basic Metabolic Panel: Recent Labs  Lab 07/05/18 2024 07/06/18 0535  NA 135 141  K 3.4* 3.9  CL 100 110  CO2 23 26  GLUCOSE 125* 103*  BUN 9 8  CREATININE 0.91 0.74  CALCIUM 8.8* 8.2*   Liver Function Tests: Recent Labs  Lab 07/05/18 2024  AST 16  ALT 13  ALKPHOS 78  BILITOT 0.3  PROT 7.1  ALBUMIN 3.9   No results for input(s): LIPASE, AMYLASE in the last 168 hours. No results for input(s): AMMONIA in the last 168 hours. CBC: Recent Labs  Lab 07/05/18 2024 07/06/18 0535  WBC 4.7 3.7*  NEUTROABS 4.1  --   HGB 14.3 13.5  HCT 44.6 42.4  MCV 85.4 87.1  PLT 179 156   Cardiac Enzymes: No results for input(s): CKTOTAL, CKMB, CKMBINDEX, TROPONINI in the last 168 hours. BNP: Invalid input(s): POCBNP CBG: Recent Labs  Lab 07/06/18 0716  GLUCAP 125*    Time coordinating discharge:  36 minutes  Signed:  Catarina Hartshorn, DO [facility] Pager: [phone] 07/06/2018, 3:25 PM

## 2018-07-06 NOTE — Progress Notes (Addendum)
Patient ID: Timothy Christensen, male   DOB: [birth year], 23 y.o.   MRN: [id number] Brief History:  23 year old male with a history of polysubstance abuse including cocaine, tobacco, and buprenorphine presenting with 2 to 3-day history of worsening erythema, edema, and pain in his left antecubital fossa.  The patient states that he last injected intravenous drugs approximately 2 to 3 weeks prior to this admission.  He states that he last snorted cocaine approximately weeks prior to this admission.  He initially noted a knot in his left antecubital fossa approximately 2 weeks prior to this admission which has gradually increased in size.  He has had some subjective fevers and chills but denies any headache, neck pain, chest pain, shortness breath, coughing, nausea, vomiting, diarrhea, domino pain. In the emergency department, the patient temperature up to 100.4 F.  He was hemodynamically stable saturating 100% room air.  BMP, CBC, and LFTs were essentially unremarkable.  Urine drug screen was positive for cocaine.   Examination of the elbow shows an abscess at the distal part of the upper arm just above the elbow crease  Swollen erythematous tender lymph nodes seem normal to palpation  Notes indicate positive cocaine drug screen  I am going to get an MRI with and without contrast to see the depth of this mass, seems superficial.  No surgery will be done today

## 2018-07-06 NOTE — Consult Note (Signed)
Reason for Consult: left elbow/arm mass Referring Physician: Dr Onalee Hua Tat   Timothy Christensen is an 23 y.o. male.  HPI: 87 year old IV drug abuser use drugs 2 to 3 weeks ago noticed a small lump in his arm just above the left elbow crease progressively increased in size started having pain came to the emergency room with pain decreased range of motion fever occasional chills  Taken from the medical record Brief History:  23 year old male with a history of polysubstance abuse including cocaine, tobacco, and buprenorphine presenting with 2 to 3-day history of worsening erythema, edema, and pain in his left antecubital fossa.  The patient states that he last injected intravenous drugs approximately 2 to 3 weeks prior to this admission.  He states that he last snorted cocaine approximately weeks prior to this admission.  He initially noted a knot in his left antecubital fossa approximately 2 weeks prior to this admission which has gradually increased in size.  He has had some subjective fevers and chills but denies any headache, neck pain, chest pain, shortness breath, coughing, nausea, vomiting, diarrhea, domino pain. In the emergency department, the patient temperature up to 100.4 F.  He was hemodynamically stable saturating 100% room air.  BMP, CBC, and LFTs were essentially unremarkable.  Urine drug screen was positive for cocaine.   History reviewed. No pertinent past medical history.  Denies any major medical problems other than his drug abuse  Past Surgical History:  Procedure Laterality Date  . WRIST SURGERY      History reviewed. No pertinent family history.  Social History:  reports that he has been smoking cigarettes. He has been smoking about 0.50 packs per day. He has never used smokeless tobacco. He reports that he does not drink alcohol or use drugs.  Allergies: No Known Allergies    Results for orders placed or performed during the hospital encounter of 07/05/18 (from the past 48  hour(s))  Comprehensive metabolic panel     Status: Abnormal   Collection Time: 07/05/18  8:24 PM  Result Value Ref Range   Sodium 135 135 - 145 mmol/L   Potassium 3.4 (L) 3.5 - 5.1 mmol/L   Chloride 100 98 - 111 mmol/L   CO2 23 22 - 32 mmol/L   Glucose, Bld 125 (H) 70 - 99 mg/dL   BUN 9 6 - 20 mg/dL   Creatinine, Ser 1.61 0.61 - 1.24 mg/dL   Calcium 8.8 (L) 8.9 - 10.3 mg/dL   Total Protein 7.1 6.5 - 8.1 g/dL   Albumin 3.9 3.5 - 5.0 g/dL   AST 16 15 - 41 U/L   ALT 13 0 - 44 U/L   Alkaline Phosphatase 78 38 - 126 U/L   Total Bilirubin 0.3 0.3 - 1.2 mg/dL   GFR calc non Af Amer >60 >60 mL/min   GFR calc Af Amer >60 >60 mL/min   Anion gap 12 5 - 15    Comment: Performed at [facility], [address] [place], [place] [address]  Lactic acid, plasma     Status: Abnormal   Collection Time: 07/05/18  8:24 PM  Result Value Ref Range   Lactic Acid, Venous 2.1 (HH) 0.5 - 1.9 mmol/L    Comment: CRITICAL RESULT CALLED TO, READ BACK BY AND VERIFIED WITH: NICHOLS,K ON 07/05/18 AT 2130 BY LOY,C Performed at [facility], [address] [place], [place] [address]   CBC with Differential     Status: Abnormal   Collection Time: 07/05/18  8:24 PM  Result Value Ref Range   WBC 4.7 4.0 - 10.5 K/uL   RBC 5.22 4.22 - 5.81 MIL/uL   Hemoglobin 14.3 13.0 - 17.0 g/dL   HCT 82.5 18.9 - 84.2 %   MCV 85.4 80.0 - 100.0 fL   MCH 27.4 26.0 - 34.0 pg   MCHC 32.1 30.0 - 36.0 g/dL   RDW 10.3 12.8 - 11.8 %   Platelets 179 150 - 400 K/uL   nRBC 0.0 0.0 - 0.2 %   Neutrophils Relative % 87 %   Neutro Abs 4.1 1.7 - 7.7 K/uL   Lymphocytes Relative 7 %   Lymphs Abs 0.3 (L) 0.7 - 4.0 K/uL   Monocytes Relative 6 %   Monocytes Absolute 0.3 0.1 - 1.0 K/uL   Eosinophils Relative 0 %   Eosinophils Absolute 0.0 0.0 - 0.5 K/uL   Basophils Relative 0 %   Basophils Absolute 0.0 0.0 - 0.1 K/uL   Immature Granulocytes 0 %   Abs Immature Granulocytes 0.01 0.00 - 0.07 K/uL    Comment: Performed at [facility],  [address] [place], [place] [address]  Protime-INR     Status: None   Collection Time: 07/05/18  8:24 PM  Result Value Ref Range   Prothrombin Time 14.5 11.4 - 15.2 seconds   INR 1.1 0.8 - 1.2    Comment: (NOTE) INR goal varies based on device and disease states. Performed at [facility], [address], [place], [place] [address]   Culture, blood (Routine x 2)     Status: None (Preliminary result)   Collection Time: 07/05/18  8:24 PM  Result Value Ref Range   Specimen Description BLOOD    Special Requests NONE    Culture      NO GROWTH < 12 HOURS Performed at [facility], [address] [place], [place] [address]    Report Status PENDING   Urinalysis, Routine w reflex microscopic     Status: None   Collection Time: 07/05/18  8:24 PM  Result Value Ref Range   Color, Urine YELLOW YELLOW   APPearance CLEAR CLEAR   Specific Gravity, Urine 1.013 1.005 - 1.030   pH 6.0 5.0 - 8.0   Glucose, UA NEGATIVE NEGATIVE mg/dL   Hgb urine dipstick NEGATIVE NEGATIVE   Bilirubin Urine NEGATIVE NEGATIVE   Ketones, ur NEGATIVE NEGATIVE mg/dL   Protein, ur NEGATIVE NEGATIVE mg/dL   Nitrite NEGATIVE NEGATIVE   Leukocytes,Ua NEGATIVE NEGATIVE    Comment: Performed at [facility], [address] [place], [place] [address]  Culture, blood (Routine x 2)     Status: None (Preliminary result)   Collection Time: 07/05/18  8:29 PM  Result Value Ref Range   Specimen Description BLOOD    Special Requests NONE    Culture      NO GROWTH < 12 HOURS Performed at [facility], [address] [place], [place] [address]    Report Status PENDING   SARS Coronavirus 2 (CEPHEID - Performed in [facility] hospital lab), Hosp Order     Status: None   Collection Time: 07/05/18  9:01 PM  Result Value Ref Range   SARS Coronavirus 2 NEGATIVE NEGATIVE    Comment: (NOTE) If result is NEGATIVE SARS-CoV-2 target nucleic acids are NOT DETECTED. The SARS-CoV-2 RNA is generally detectable in upper and lower  respiratory  specimens during the acute phase of infection. The lowest  concentration of SARS-CoV-2 viral copies this assay can detect is 250  copies /  mL. A negative result does not preclude SARS-CoV-2 infection  and should not be used as the sole basis for treatment or other  patient management decisions.  A negative result may occur with  improper specimen collection / handling, submission of specimen other  than nasopharyngeal swab, presence of viral mutation(s) within the  areas targeted by this assay, and inadequate number of viral copies  (<250 copies / mL). A negative result must be combined with clinical  observations, patient history, and epidemiological information. If result is POSITIVE SARS-CoV-2 target nucleic acids are DETECTED. The SARS-CoV-2 RNA is generally detectable in upper and lower  respiratory specimens dur ing the acute phase of infection.  Positive  results are indicative of active infection with SARS-CoV-2.  Clinical  correlation with patient history and other diagnostic information is  necessary to determine patient infection status.  Positive results do  not rule out bacterial infection or co-infection with other viruses. If result is PRESUMPTIVE POSTIVE SARS-CoV-2 nucleic acids MAY BE PRESENT.   A presumptive positive result was obtained on the submitted specimen  and confirmed on repeat testing.  While 2019 novel coronavirus  (SARS-CoV-2) nucleic acids may be present in the submitted sample  additional confirmatory testing may be necessary for epidemiological  and / or clinical management purposes  to differentiate between  SARS-CoV-2 and other Sarbecovirus currently known to infect humans.  If clinically indicated additional testing with an alternate test  methodology [phone]([id number]) is advised. The SARS-CoV-2 RNA is generally  detectable in upper and lower respiratory sp ecimens during the acute  phase of infection. The expected result is Negative. Fact Sheet for  Patients:  BoilerBrush.com.cyhttps://www.fda.gov/media/136312/download Fact Sheet for Healthcare Providers: https://pope.com/https://www.fda.gov/media/136313/download This test is not yet approved or cleared by the [place] FDA and has been authorized for detection and/or diagnosis of SARS-CoV-2 by FDA under an Emergency Use Authorization (EUA).  This EUA will remain in effect (meaning this test can be used) for the duration of the COVID-19 declaration under Section 564(b)(1) of the Act, 21 U.S.C. section 360bbb-3(b)(1), unless the authorization is terminated or revoked sooner. Performed at [facility], [address] [place], [place] [address]   Urine rapid drug screen (hosp performed)     Status: Abnormal   Collection Time: 07/05/18 10:08 PM  Result Value Ref Range   Opiates NONE DETECTED NONE DETECTED   Cocaine POSITIVE (A) NONE DETECTED   Benzodiazepines NONE DETECTED NONE DETECTED   Amphetamines NONE DETECTED NONE DETECTED   Tetrahydrocannabinol NONE DETECTED NONE DETECTED   Barbiturates NONE DETECTED NONE DETECTED    Comment: (NOTE) DRUG SCREEN FOR MEDICAL PURPOSES ONLY.  IF CONFIRMATION IS NEEDED FOR ANY PURPOSE, NOTIFY LAB WITHIN 5 DAYS. LOWEST DETECTABLE LIMITS FOR URINE DRUG SCREEN Drug Class                     Cutoff (ng/mL) Amphetamine and metabolites    1000 Barbiturate and metabolites    200 Benzodiazepine                 200 Tricyclics and metabolites     300 Opiates and metabolites        300 Cocaine and metabolites        300 THC                            50 Performed at [facility], [address] [place], [place] [address]   Lactic acid, plasma  Status: None   Collection Time: 07/05/18 10:53 PM  Result Value Ref Range   Lactic Acid, Venous 1.1 0.5 - 1.9 mmol/L    Comment: Performed at [facility], [address], [place], [place] [address]  Basic metabolic panel     Status: Abnormal   Collection Time: 07/06/18  5:35 AM  Result Value Ref Range   Sodium 141 135 - 145 mmol/L    Potassium 3.9 3.5 - 5.1 mmol/L   Chloride 110 98 - 111 mmol/L   CO2 26 22 - 32 mmol/L   Glucose, Bld 103 (H) 70 - 99 mg/dL   BUN 8 6 - 20 mg/dL   Creatinine, Ser 6.04 0.61 - 1.24 mg/dL   Calcium 8.2 (L) 8.9 - 10.3 mg/dL   GFR calc non Af Amer >60 >60 mL/min   GFR calc Af Amer >60 >60 mL/min   Anion gap 5 5 - 15    Comment: Performed at [facility], [address] [place], [place] [address]  CBC     Status: Abnormal   Collection Time: 07/06/18  5:35 AM  Result Value Ref Range   WBC 3.7 (L) 4.0 - 10.5 K/uL   RBC 4.87 4.22 - 5.81 MIL/uL   Hemoglobin 13.5 13.0 - 17.0 g/dL   HCT 11.9 14.7 - 82.9 %   MCV 87.1 80.0 - 100.0 fL   MCH 27.7 26.0 - 34.0 pg   MCHC 31.8 30.0 - 36.0 g/dL   RDW 56.2 13.0 - 86.5 %   Platelets 156 150 - 400 K/uL   nRBC 0.0 0.0 - 0.2 %    Comment: Performed at [facility], [address] [place], [place] [address]  Glucose, capillary     Status: Abnormal   Collection Time: 07/06/18  7:16 AM  Result Value Ref Range   Glucose-Capillary 125 (H) 70 - 99 mg/dL    Dg Chest 2 View  Result Date: 07/05/2018 CLINICAL DATA:  Sepsis EXAM: CHEST - 2 VIEW COMPARISON:  None. FINDINGS: The heart size and mediastinal contours are within normal limits. Both lungs are clear. The visualized skeletal structures are unremarkable. IMPRESSION: No active cardiopulmonary disease. Electronically Signed   By: Katherine Mantle M.D.   On: 07/05/2018 21:25    Review of Systems  Constitutional: Positive for chills, fever and malaise/fatigue.  Musculoskeletal: Positive for joint pain.  Neurological: Negative for tingling.   Blood pressure 122/82, pulse 86, temperature 98.8 F (37.1 C), temperature source Oral, resp. rate 16, height 6' (1.829 m), weight 68 kg, SpO2 99 %. Physical Exam  Constitutional: He is oriented to person, place, and time. He appears well-developed and well-nourished. No distress.  HENT:  Head: Normocephalic and atraumatic.  Eyes: Pupils are equal, round, and  reactive to light. Conjunctivae and EOM are normal. Right eye exhibits no discharge. Left eye exhibits no discharge. No scleral icterus.  Neck: Normal range of motion. Neck supple.  Cardiovascular: Normal rate and regular rhythm.  Musculoskeletal:        General: Tenderness, deformity and edema present.     Right shoulder: He exhibits decreased range of motion, tenderness, swelling, pain, spasm and decreased strength. He exhibits no effusion, no crepitus, no deformity, no laceration and normal pulse.     Right elbow: He exhibits normal range of motion, no swelling, no effusion, no deformity and no laceration. No tenderness found. No radial head, no medial epicondyle, no lateral epicondyle and no olecranon process tenderness noted.       Arms:  Lymphadenopathy:    He has no cervical adenopathy.       Left cervical: No superficial cervical adenopathy present.       Left axillary: No lateral adenopathy present.      Left: No inguinal, no supraclavicular and no epitrochlear adenopathy present.  Neurological: He is alert and oriented to person, place, and time. He displays normal reflexes. He exhibits normal muscle tone. Coordination normal.  Skin: Skin is warm and dry. No rash noted. He is not diaphoretic. There is erythema. No pallor.  Psychiatric: He has a normal mood and affect. His behavior is normal. Judgment normal.    Assessment/Plan: Wbc 3.7 probably doesn't mount a good immune response with his history (hiv is negative). Mri to assess depth of the infection and plan surgery   Plain films will be needed as well   Vancomycin good choice      Fuller Canada 07/06/2018, 1:44 PM

## 2018-07-06 NOTE — Care Management (Signed)
Visited patient at bedside. Patient agitated. Reports he is going to leave AMA, has called someone to pick him up.   Discussed that he has no PCP/insurance. Patient stated "this is not important to me".  Provided PCP list.  Asked CM to notify nurse that he needs his IV taken out.

## 2018-07-06 NOTE — Progress Notes (Signed)
Held AM meds because patient was NPO and asleep.

## 2018-07-06 NOTE — ED Notes (Signed)
Pt stating that he wants to go outside to smoke. rn informed pt that if he leaves campus that he will be considered leaving ama. Pt stated that he didn't care to take iv out that he will just come back. Pt decided to stay until ride gets here. Pt taken upstairs

## 2018-07-06 NOTE — Progress Notes (Signed)
Patient left AMA, but he refused to sign paper. I made Dr. Arbutus Leas aware that patient was leaving.

## 2018-07-06 NOTE — Progress Notes (Signed)
PROGRESS NOTE  Timothy RoughJalen Christensen ZOX:[id number] DOB: 12/11/1995 DOA: 07/05/2018 PCP: Patient, No Pcp Per  Brief History:  23 year old male with a history of polysubstance abuse including cocaine, tobacco, and buprenorphine presenting with 2 to 3-day history of worsening erythema, edema, and pain in his left antecubital fossa.  The patient states that he last injected intravenous drugs approximately 2 to 3 weeks prior to this admission.  He states that he last snorted cocaine approximately weeks prior to this admission.  He initially noted a knot in his left antecubital fossa approximately 2 weeks prior to this admission which has gradually increased in size.  He has had some subjective fevers and chills but denies any headache, neck pain, chest pain, shortness breath, coughing, nausea, vomiting, diarrhea, domino pain. In the emergency department, the patient temperature up to 100.4 F.  He was hemodynamically stable saturating 100% room air.  BMP, CBC, and LFTs were essentially unremarkable.  Urine drug screen was positive for cocaine.  Assessment/Plan: Left upper extremity abscess/cellulitis -Concerned about fluctuant area in the left antecubital fossa -Consulted hand surgery, Dr. Romeo AppleHarrison -Spoke with Dr. Romeo AppleHarrison who requested to make the patient n.p.o. for possible surgery -Continue vancomycin  Sepsis -Present at the time of admission -Patient presented with fever, tachycardia, and elevated lactic acid -Source of cellulitis -Lactic acid peaked 2.1 -Continue IV fluids -Follow blood cultures  Polysubstance abuse -Including cocaine, tobacco, and buprenorphine  Tobacco abuse -I have discussed tobacco cessation with the patient.  I have counseled the patient regarding the negative impacts of continued tobacco use including but not limited to lung cancer, COPD, and cardiovascular disease.  I have discussed alternatives to tobacco and modalities that may help facilitate tobacco cessation  including but not limited to biofeedback, hypnosis, and medications.  Total time spent with tobacco counseling was 4 minutes.  Noncompliance with medical therapy -I discussed the medical plan with the patient including continued intravenous antibiotics and consulting hand surgery and pain control -I explained to the patient that I spoke with the hand surgeon, Dr. Romeo AppleHarrison who will see the patient later today and requested the patient be n.p.o. for possible surgery -The patient adamantly states, " if you cannot tell me that there will definitely be surgery, I am going to eat.  I am not going to wait until this afternoon.  By that time, the food will be out of my stomach.  I have had surgery done before, and this is not real surgery.  They are just going to numb me up and cut and drain it so I do not understand why I cannot eat." -I kindly explained to the patient that I am not the surgeon and the decision for any type of invasive procedure would be up to the surgeon after he evaluates the patient -Patient further states, "I'm not going to wait 4 or 5 or 6 hours to eat if you are only telling me that I 'might' go to surgery and it is not a definite." -I further explained to the patient that I understand that it is a difficult situation to remain npo, but we all work together as a team and we want to get him better as fast as we can and eating may delay any type of procedure.  I assured him that he may be allowed to eat and drink once Dr. Romeo AppleHarrison evaluates him and further clarifies the need for any invasive procedure. -patient then states, "I don't care what  you say.  I'm going to eat."    Disposition Plan:   Home likely in 1-2 days pending hand surgery eval Family Communication:  No Family at bedside  Consultants:  Hand surgery--Dr. Romeo Apple  Code Status:  FULL  DVT Prophylaxis:  [place] Heparin--on hold   Procedures: As Listed in Progress Note Above  Antibiotics: vanco 5/21>>>    Total time  spent 60 minutes.  Greater than 50% spent face to face counseling and coordinating care.    Subjective: Patient states that his left upper extremity pain is a little bit better than yesterday.  He states that the edema and erythema are little better.  He denies any chest pain, shortness breath, nausea, vomiting, diarrhea, abdominal pain.  There is no dysuria hematuria.  Objective: Vitals:   07/05/18 2300 07/05/18 2330 07/06/18 0019 07/06/18 0020  BP: 110/74 105/70 122/82   Pulse:   86   Resp:  18 16   Temp:   98.8 F (37.1 C)   TempSrc:   Oral   SpO2:  100% 100%   Weight:    68 kg  Height:    6' (1.829 m)    Intake/Output Summary (Last 24 hours) at 07/06/2018 0807 Last data filed at 07/06/2018 0600 Gross per 24 hour  Intake 3790 ml  Output -  Net 3790 ml   Weight change:  Exam:   General:  Pt is alert, follows commands appropriately, not in acute distress  HEENT: No icterus, No thrush, No neck mass, [place]/AT  Cardiovascular: RRR, S1/S2, no rubs, no gallops  Respiratory: CTA bilaterally, no wheezing, no crackles, no rhonchi  Abdomen: Soft/+BS, non tender, non distended, no guarding  Extremities: Please see pictures below.  Capillary refill less than 2 seconds.  Radial and ulnar pulses present in the left upper extremity.       Data Reviewed: I have personally reviewed following labs and imaging studies Basic Metabolic Panel: Recent Labs  Lab 07/05/18 2024 07/06/18 0535  NA 135 141  K 3.4* 3.9  CL 100 110  CO2 23 26  GLUCOSE 125* 103*  BUN 9 8  CREATININE 0.91 0.74  CALCIUM 8.8* 8.2*   Liver Function Tests: Recent Labs  Lab 07/05/18 2024  AST 16  ALT 13  ALKPHOS 78  BILITOT 0.3  PROT 7.1  ALBUMIN 3.9   No results for input(s): LIPASE, AMYLASE in the last 168 hours. No results for input(s): AMMONIA in the last 168 hours. Coagulation Profile: Recent Labs  Lab 07/05/18 2024  INR 1.1   CBC: Recent Labs  Lab 07/05/18 2024 07/06/18 0535  WBC  4.7 3.7*  NEUTROABS 4.1  --   HGB 14.3 13.5  HCT 44.6 42.4  MCV 85.4 87.1  PLT 179 156   Cardiac Enzymes: No results for input(s): CKTOTAL, CKMB, CKMBINDEX, TROPONINI in the last 168 hours. BNP: Invalid input(s): POCBNP CBG: Recent Labs  Lab 07/06/18 0716  GLUCAP 125*   HbA1C: No results for input(s): HGBA1C in the last 72 hours. Urine analysis:    Component Value Date/Time   COLORURINE YELLOW 07/05/2018 2024   APPEARANCEUR CLEAR 07/05/2018 2024   LABSPEC 1.013 07/05/2018 2024   PHURINE 6.0 07/05/2018 2024   GLUCOSEU NEGATIVE 07/05/2018 2024   HGBUR NEGATIVE 07/05/2018 2024   BILIRUBINUR NEGATIVE 07/05/2018 2024   KETONESUR NEGATIVE 07/05/2018 2024   PROTEINUR NEGATIVE 07/05/2018 2024   NITRITE NEGATIVE 07/05/2018 2024   LEUKOCYTESUR NEGATIVE 07/05/2018 2024   Sepsis Labs: (procalcitonin:4,lacticidven:4) ) Recent Results (from the  past 240 hour(s))  SARS Coronavirus 2 (CEPHEID - Performed in [facility] hospital lab), Hosp Order     Status: None   Collection Time: 07/05/18  9:01 PM  Result Value Ref Range Status   SARS Coronavirus 2 NEGATIVE NEGATIVE Final    Comment: (NOTE) If result is NEGATIVE SARS-CoV-2 target nucleic acids are NOT DETECTED. The SARS-CoV-2 RNA is generally detectable in upper and lower  respiratory specimens during the acute phase of infection. The lowest  concentration of SARS-CoV-2 viral copies this assay can detect is 250  copies / mL. A negative result does not preclude SARS-CoV-2 infection  and should not be used as the sole basis for treatment or other  patient management decisions.  A negative result may occur with  improper specimen collection / handling, submission of specimen other  than nasopharyngeal swab, presence of viral mutation(s) within the  areas targeted by this assay, and inadequate number of viral copies  (<250 copies / mL). A negative result must be combined with clinical  observations, patient history, and  epidemiological information. If result is POSITIVE SARS-CoV-2 target nucleic acids are DETECTED. The SARS-CoV-2 RNA is generally detectable in upper and lower  respiratory specimens dur ing the acute phase of infection.  Positive  results are indicative of active infection with SARS-CoV-2.  Clinical  correlation with patient history and other diagnostic information is  necessary to determine patient infection status.  Positive results do  not rule out bacterial infection or co-infection with other viruses. If result is PRESUMPTIVE POSTIVE SARS-CoV-2 nucleic acids MAY BE PRESENT.   A presumptive positive result was obtained on the submitted specimen  and confirmed on repeat testing.  While 2019 novel coronavirus  (SARS-CoV-2) nucleic acids may be present in the submitted sample  additional confirmatory testing may be necessary for epidemiological  and / or clinical management purposes  to differentiate between  SARS-CoV-2 and other Sarbecovirus currently known to infect humans.  If clinically indicated additional testing with an alternate test  methodology [phone]) is advised. The SARS-CoV-2 RNA is generally  detectable in upper and lower respiratory sp ecimens during the acute  phase of infection. The expected result is Negative. Fact Sheet for Patients:  BoilerBrush.com.cy Fact Sheet for Healthcare Providers: https://pope.com/ This test is not yet approved or cleared by the [place] FDA and has been authorized for detection and/or diagnosis of SARS-CoV-2 by FDA under an Emergency Use Authorization (EUA).  This EUA will remain in effect (meaning this test can be used) for the duration of the COVID-19 declaration under Section 564(b)(1) of the Act, 21 U.S.C. section 360bbb-3(b)(1), unless the authorization is terminated or revoked sooner. Performed at [facility], [address] [place], [place] [address]      Scheduled Meds:  . gabapentin  300 mg Oral TID  . heparin  5,000 Units Subcutaneous Q8H  . hydrOXYzine  25 mg Oral TID  . hydrOXYzine  50 mg Intramuscular Once  . ketorolac  30 mg Intravenous Q6H  . methocarbamol  750 mg Oral QID  . nicotine  14 mg Transdermal Daily  . sodium chloride flush  3 mL Intravenous Once  . sodium chloride flush  3 mL Intravenous Q12H  . traZODone  100 mg Oral QHS   Continuous Infusions: . sodium chloride 150 mL/hr at 07/06/18 0546  . sodium chloride    . sodium chloride    . vancomycin      Procedures/Studies: Dg Chest 2 View  Result Date: 07/05/2018 CLINICAL  DATA:  Sepsis EXAM: CHEST - 2 VIEW COMPARISON:  None. FINDINGS: The heart size and mediastinal contours are within normal limits. Both lungs are clear. The visualized skeletal structures are unremarkable. IMPRESSION: No active cardiopulmonary disease. Electronically Signed   By: Katherine Mantle M.D.   On: 07/05/2018 21:25    Catarina Hartshorn, DO  [facility] Pager [phone]  If 7PM-7AM, please contact night-coverage [email or web address] Password [email or web address] 07/06/2018, 8:07 AM   LOS: 1 day

## 2018-07-07 ENCOUNTER — Emergency Department (HOSPITAL_COMMUNITY): Payer: Self-pay

## 2018-07-07 ENCOUNTER — Inpatient Hospital Stay (HOSPITAL_COMMUNITY)
Admission: EM | Admit: 2018-07-07 | Discharge: 2018-07-07 | DRG: 603 | Discharge status: Against Medical Advice | Payer: Self-pay | Attending: Internal Medicine | Admitting: Internal Medicine

## 2018-07-07 ENCOUNTER — Inpatient Hospital Stay (HOSPITAL_COMMUNITY)
Admission: EM | Admit: 2018-07-07 | Discharge: 2018-07-09 | DRG: 854 | Disposition: A | Discharge status: Home / Self Care | Payer: Self-pay | Attending: Internal Medicine | Admitting: Internal Medicine

## 2018-07-07 ENCOUNTER — Encounter (HOSPITAL_COMMUNITY): Payer: Self-pay

## 2018-07-07 ENCOUNTER — Other Ambulatory Visit: Payer: Self-pay

## 2018-07-07 ENCOUNTER — Encounter (HOSPITAL_COMMUNITY): Payer: Self-pay | Admitting: Emergency Medicine

## 2018-07-07 DIAGNOSIS — L0291 Cutaneous abscess, unspecified: Secondary | ICD-10-CM | POA: Diagnosis present

## 2018-07-07 DIAGNOSIS — Z1159 Encounter for screening for other viral diseases: Secondary | ICD-10-CM

## 2018-07-07 DIAGNOSIS — A419 Sepsis, unspecified organism: Principal | ICD-10-CM | POA: Diagnosis present

## 2018-07-07 DIAGNOSIS — F1410 Cocaine abuse, uncomplicated: Secondary | ICD-10-CM | POA: Diagnosis present

## 2018-07-07 DIAGNOSIS — L02414 Cutaneous abscess of left upper limb: Secondary | ICD-10-CM | POA: Diagnosis present

## 2018-07-07 DIAGNOSIS — Z91199 Patient's noncompliance with other medical treatment and regimen due to unspecified reason: Secondary | ICD-10-CM

## 2018-07-07 DIAGNOSIS — D72825 Bandemia: Secondary | ICD-10-CM | POA: Diagnosis present

## 2018-07-07 DIAGNOSIS — L02412 Cutaneous abscess of left axilla: Secondary | ICD-10-CM

## 2018-07-07 DIAGNOSIS — F1110 Opioid abuse, uncomplicated: Secondary | ICD-10-CM | POA: Diagnosis present

## 2018-07-07 DIAGNOSIS — L02419 Cutaneous abscess of limb, unspecified: Secondary | ICD-10-CM

## 2018-07-07 DIAGNOSIS — F1990 Other psychoactive substance use, unspecified, uncomplicated: Secondary | ICD-10-CM | POA: Diagnosis present

## 2018-07-07 DIAGNOSIS — Z720 Tobacco use: Secondary | ICD-10-CM | POA: Diagnosis present

## 2018-07-07 DIAGNOSIS — F1910 Other psychoactive substance abuse, uncomplicated: Secondary | ICD-10-CM | POA: Diagnosis present

## 2018-07-07 DIAGNOSIS — E876 Hypokalemia: Secondary | ICD-10-CM | POA: Diagnosis present

## 2018-07-07 DIAGNOSIS — L03119 Cellulitis of unspecified part of limb: Secondary | ICD-10-CM | POA: Diagnosis present

## 2018-07-07 DIAGNOSIS — L03114 Cellulitis of left upper limb: Secondary | ICD-10-CM | POA: Diagnosis present

## 2018-07-07 DIAGNOSIS — B377 Candidal sepsis: Secondary | ICD-10-CM

## 2018-07-07 DIAGNOSIS — B49 Unspecified mycosis: Secondary | ICD-10-CM | POA: Diagnosis present

## 2018-07-07 DIAGNOSIS — F17210 Nicotine dependence, cigarettes, uncomplicated: Secondary | ICD-10-CM | POA: Diagnosis present

## 2018-07-07 DIAGNOSIS — Z9119 Patient's noncompliance with other medical treatment and regimen: Secondary | ICD-10-CM

## 2018-07-07 DIAGNOSIS — Z79899 Other long term (current) drug therapy: Secondary | ICD-10-CM

## 2018-07-07 LAB — COMPREHENSIVE METABOLIC PANEL
ALT: 16 U/L (ref 0–44)
ALT: 19 U/L (ref 0–44)
AST: 16 U/L (ref 15–41)
AST: 16 U/L (ref 15–41)
Albumin: 3.6 g/dL (ref 3.5–5.0)
Albumin: 3.5 g/dL (ref 3.5–5.0)
Alkaline Phosphatase: 68 U/L (ref 38–126)
Alkaline Phosphatase: 74 U/L (ref 38–126)
Anion gap: 10 (ref 5–15)
Anion gap: 7 (ref 5–15)
BUN: 5 mg/dL — ABNORMAL LOW (ref 6–20)
BUN: <5 mg/dL — ABNORMAL LOW (ref 6–20)
CO2: 25 mmol/L (ref 22–32)
CO2: 23 mmol/L (ref 22–32)
Calcium: 8.8 mg/dL — ABNORMAL LOW (ref 8.9–10.3)
Calcium: 8.7 mg/dL — ABNORMAL LOW (ref 8.9–10.3)
Chloride: 106 mmol/L (ref 98–111)
Chloride: 106 mmol/L (ref 98–111)
Creatinine, Ser: 0.7 mg/dL (ref 0.61–1.24)
Creatinine, Ser: 0.82 mg/dL (ref 0.61–1.24)
GFR calc Af Amer: >60 mL/min (ref >60)
GFR calc Af Amer: >60 mL/min (ref >60)
GFR calc non Af Amer: >60 mL/min (ref >60)
GFR calc non Af Amer: >60 mL/min (ref >60)
Glucose, Bld: 103 mg/dL — ABNORMAL HIGH (ref 70–99)
Glucose, Bld: 110 mg/dL — ABNORMAL HIGH (ref 70–99)
Potassium: 3.4 mmol/L — ABNORMAL LOW (ref 3.5–5.1)
Potassium: 3.9 mmol/L (ref 3.5–5.1)
Sodium: 141 mmol/L (ref 135–145)
Sodium: 136 mmol/L (ref 135–145)
Total Bilirubin: 0.4 mg/dL (ref 0.3–1.2)
Total Bilirubin: 0.3 mg/dL (ref 0.3–1.2)
Total Protein: 6.5 g/dL (ref 6.5–8.1)
Total Protein: 6.4 g/dL — ABNORMAL LOW (ref 6.5–8.1)

## 2018-07-07 LAB — CBC WITH DIFFERENTIAL/PLATELET
Abs Immature Granulocytes: 0.01 10*3/uL (ref 0.00–0.07)
Basophils Absolute: 0 10*3/uL (ref 0.0–0.1)
Basophils Relative: 1 %
Eosinophils Absolute: 0.1 10*3/uL (ref 0.0–0.5)
Eosinophils Relative: 1 %
HCT: 39.4 % (ref 39.0–52.0)
Hemoglobin: 13 g/dL (ref 13.0–17.0)
Immature Granulocytes: 0 %
Lymphocytes Relative: 16 %
Lymphs Abs: 0.9 10*3/uL (ref 0.7–4.0)
MCH: 28.1 pg (ref 26.0–34.0)
MCHC: 33 g/dL (ref 30.0–36.0)
MCV: 85.1 fL (ref 80.0–100.0)
Monocytes Absolute: 0.6 10*3/uL (ref 0.1–1.0)
Monocytes Relative: 10 %
Neutro Abs: 4.2 10*3/uL (ref 1.7–7.7)
Neutrophils Relative %: 72 %
Platelets: 194 10*3/uL (ref 150–400)
RBC: 4.63 MIL/uL (ref 4.22–5.81)
RDW: 15.1 % (ref 11.5–15.5)
WBC: 5.8 10*3/uL (ref 4.0–10.5)
nRBC: 0 % (ref 0.0–0.2)

## 2018-07-07 LAB — CBC
HCT: 41.2 % (ref 39.0–52.0)
Hemoglobin: 13.3 g/dL (ref 13.0–17.0)
MCH: 27.7 pg (ref 26.0–34.0)
MCHC: 32.3 g/dL (ref 30.0–36.0)
MCV: 85.7 fL (ref 80.0–100.0)
Platelets: 189 10*3/uL (ref 150–400)
RBC: 4.81 MIL/uL (ref 4.22–5.81)
RDW: 15.1 % (ref 11.5–15.5)
WBC: 5 10*3/uL (ref 4.0–10.5)
nRBC: 0 % (ref 0.0–0.2)

## 2018-07-07 LAB — LACTIC ACID, PLASMA
Lactic Acid, Venous: 1.6 mmol/L (ref 0.5–1.9)
Lactic Acid, Venous: 1.3 mmol/L (ref 0.5–1.9)

## 2018-07-07 LAB — BLOOD CULTURE ID PANEL (REFLEXED)
Acinetobacter baumannii: NOT DETECTED
Candida albicans: DETECTED — AB
Candida glabrata: NOT DETECTED
Candida krusei: NOT DETECTED
Candida parapsilosis: NOT DETECTED
Candida tropicalis: NOT DETECTED
Enterobacter cloacae complex: NOT DETECTED
Enterobacteriaceae species: NOT DETECTED
Enterococcus species: NOT DETECTED
Escherichia coli: NOT DETECTED
Haemophilus influenzae: NOT DETECTED
Klebsiella oxytoca: NOT DETECTED
Klebsiella pneumoniae: NOT DETECTED
Listeria monocytogenes: NOT DETECTED
Neisseria meningitidis: NOT DETECTED
Proteus species: NOT DETECTED
Pseudomonas aeruginosa: NOT DETECTED
Serratia marcescens: NOT DETECTED
Staphylococcus aureus (BCID): NOT DETECTED
Staphylococcus species: NOT DETECTED
Streptococcus agalactiae: NOT DETECTED
Streptococcus pneumoniae: NOT DETECTED
Streptococcus pyogenes: NOT DETECTED
Streptococcus species: NOT DETECTED

## 2018-07-07 LAB — HIV ANTIBODY (ROUTINE TESTING W REFLEX): HIV Screen 4th Generation wRfx: NONREACTIVE

## 2018-07-07 LAB — SARS CORONAVIRUS 2 BY RT PCR (HOSPITAL ORDER, PERFORMED IN ~~LOC~~ HOSPITAL LAB): SARS Coronavirus 2: NEGATIVE

## 2018-07-07 MED ORDER — SODIUM CHLORIDE 0.9 % IV SOLN
200.0000 mg | INTRAVENOUS | Status: DC
  Filled 2018-07-07 (×3): qty 200

## 2018-07-07 MED ORDER — OXYCODONE HCL 5 MG PO TABS
5.0000 mg | ORAL_TABLET | ORAL | Status: DC | PRN
  Administered 2018-07-07 – 2018-07-09 (×8): 5 mg via ORAL
  Filled 2018-07-07 (×9): qty 1

## 2018-07-07 MED ORDER — POTASSIUM CHLORIDE CRYS ER 20 MEQ PO TBCR
40.0000 meq | EXTENDED_RELEASE_TABLET | Freq: Once | ORAL | Status: AC
  Administered 2018-07-07: 10:00:00 40 meq via ORAL
  Filled 2018-07-07: qty 2

## 2018-07-07 MED ORDER — ACETAMINOPHEN 650 MG RE SUPP: 650.0000 mg | Freq: Four times a day (QID) | RECTAL | Status: DC | PRN

## 2018-07-07 MED ORDER — ACETAMINOPHEN 325 MG PO TABS: 650.0000 mg | ORAL_TABLET | Freq: Four times a day (QID) | ORAL | Status: DC | PRN

## 2018-07-07 MED ORDER — VANCOMYCIN HCL IN DEXTROSE 1-5 GM/200ML-% IV SOLN
1000.0000 mg | Freq: Three times a day (TID) | INTRAVENOUS | Status: DC
  Administered 2018-07-07: 09:00:00 1000 mg via INTRAVENOUS
  Filled 2018-07-07: qty 200

## 2018-07-07 MED ORDER — ACETAMINOPHEN 325 MG PO TABS
650.0000 mg | ORAL_TABLET | Freq: Four times a day (QID) | ORAL | Status: DC | PRN
  Administered 2018-07-07 – 2018-07-09 (×6): 650 mg via ORAL
  Filled 2018-07-07 (×7): qty 2

## 2018-07-07 MED ORDER — LACTATED RINGERS IV SOLN
INTRAVENOUS | Status: DC
  Administered 2018-07-07: 11:00:00 via INTRAVENOUS

## 2018-07-07 MED ORDER — SODIUM CHLORIDE 0.9 % IV SOLN
100.0000 mg | INTRAVENOUS | Status: DC
  Filled 2018-07-07: qty 100

## 2018-07-07 MED ORDER — SODIUM CHLORIDE 0.9 % IV SOLN: 100.0000 mg | INTRAVENOUS | Status: DC

## 2018-07-07 MED ORDER — VANCOMYCIN HCL IN DEXTROSE 1-5 GM/200ML-% IV SOLN
1000.0000 mg | Freq: Three times a day (TID) | INTRAVENOUS | Status: DC
  Administered 2018-07-07 – 2018-07-08 (×2): 1000 mg via INTRAVENOUS
  Filled 2018-07-07 (×3): qty 200

## 2018-07-07 MED ORDER — VANCOMYCIN HCL 10 G IV SOLR
1250.0000 mg | Freq: Once | INTRAVENOUS | Status: DC
  Filled 2018-07-07: qty 1250

## 2018-07-07 MED ORDER — HYDROMORPHONE HCL 1 MG/ML IJ SOLN
0.5000 mg | Freq: Once | INTRAMUSCULAR | Status: AC
  Administered 2018-07-07: 18:00:00 0.5 mg via INTRAVENOUS
  Filled 2018-07-07: qty 1

## 2018-07-07 MED ORDER — HYDROMORPHONE HCL 1 MG/ML IJ SOLN
1.0000 mg | Freq: Once | INTRAMUSCULAR | Status: AC
  Administered 2018-07-07: 1 mg via INTRAVENOUS
  Filled 2018-07-07: qty 1

## 2018-07-07 MED ORDER — FENTANYL CITRATE (PF) 100 MCG/2ML IJ SOLN
50.0000 ug | Freq: Once | INTRAMUSCULAR | Status: AC
  Administered 2018-07-07: 50 ug via INTRAVENOUS
  Filled 2018-07-07: qty 2

## 2018-07-07 MED ORDER — ENOXAPARIN SODIUM 40 MG/0.4ML ~~LOC~~ SOLN: 40.0000 mg | SUBCUTANEOUS | Status: DC

## 2018-07-07 MED ORDER — ONDANSETRON HCL 4 MG/2ML IJ SOLN: 4.0000 mg | Freq: Four times a day (QID) | INTRAMUSCULAR | Status: DC | PRN

## 2018-07-07 MED ORDER — SODIUM CHLORIDE 0.9 % IV SOLN
INTRAVENOUS | Status: DC | PRN
  Administered 2018-07-07: 09:00:00 500 mL via INTRAVENOUS

## 2018-07-07 MED ORDER — ONDANSETRON HCL 4 MG PO TABS: 4.0000 mg | ORAL_TABLET | Freq: Four times a day (QID) | ORAL | Status: DC | PRN

## 2018-07-07 MED ORDER — LIDOCAINE-EPINEPHRINE (PF) 1 %-1:200000 IJ SOLN
30.0000 mL | Freq: Once | INTRAMUSCULAR | Status: AC
  Filled 2018-07-07: qty 30

## 2018-07-07 MED ORDER — SODIUM CHLORIDE 0.9 % IV SOLN
200.0000 mg | Freq: Once | INTRAVENOUS | Status: AC
  Administered 2018-07-07: 200 mg via INTRAVENOUS
  Filled 2018-07-07: qty 200

## 2018-07-07 MED ORDER — ENOXAPARIN SODIUM 40 MG/0.4ML ~~LOC~~ SOLN
40.0000 mg | SUBCUTANEOUS | Status: DC
  Filled 2018-07-07: qty 0.4

## 2018-07-07 MED ORDER — HYDROMORPHONE HCL 1 MG/ML IJ SOLN
0.5000 mg | INTRAMUSCULAR | Status: DC | PRN
  Administered 2018-07-07 – 2018-07-08 (×2): 0.5 mg via INTRAVENOUS
  Filled 2018-07-07 (×4): qty 1

## 2018-07-07 MED ORDER — SODIUM CHLORIDE 0.9 % IV BOLUS
1000.0000 mL | Freq: Once | INTRAVENOUS | Status: AC
  Administered 2018-07-07: 1000 mL via INTRAVENOUS

## 2018-07-07 MED ORDER — KETOROLAC TROMETHAMINE 30 MG/ML IJ SOLN
30.0000 mg | Freq: Four times a day (QID) | INTRAMUSCULAR | Status: DC | PRN
  Administered 2018-07-07: 30 mg via INTRAVENOUS
  Filled 2018-07-07: qty 1

## 2018-07-07 MED ORDER — SODIUM CHLORIDE 0.9 % IV SOLN
INTRAVENOUS | Status: DC
  Administered 2018-07-07 – 2018-07-08 (×2): via INTRAVENOUS

## 2018-07-07 MED ORDER — HYDROMORPHONE HCL 1 MG/ML IJ SOLN
1.0000 mg | INTRAMUSCULAR | Status: DC | PRN
  Administered 2018-07-07 (×2): 1 mg via INTRAVENOUS
  Filled 2018-07-07: qty 1

## 2018-07-07 NOTE — ED Provider Notes (Signed)
[facility] EMERGENCY DEPARTMENT Provider Note   CSN: [id number] Arrival date & time: 07/07/18  0532    History   Chief Complaint Chief Complaint  Patient presents with  . Abscess    HPI Timothy Christensen is a 23 y.o. male.     Patient returns with abscess and cellulitis to his left AC fossa.  He left AMA yesterday afternoon after being admitted for the same.  He admits to injecting IV Suboxone about 2 weeks ago to his left [facility].  Over the past 5 days he is developed increased pain, swelling and redness with reduced range of motion of his elbow.  Reports objective fever at home with nausea.  Denies vomiting.  He was admitted for IV antibiotics and seen by Dr. Romeo Apple who is planning on obtaining an MRI.  Patient left prior to this being completed.  He states now he is willing to stay.  Reports he came back because this pain and redness is worse.  He believes he is had a fever at home but has not checked his temperature.  He denies any chest pain or shortness of breath.  He denies any IV drug use since he left the hospital.  The history is provided by the patient.  Abscess  Associated symptoms: fever   Associated symptoms: no fatigue, no headaches, no nausea and no vomiting     History reviewed. No pertinent past medical history.  Patient Active Problem List   Diagnosis Date Noted  . Noncompliance 07/06/2018  . Cocaine abuse (HCC) 07/06/2018  . Abscess   . Cellulitis and abscess of upper extremity 07/05/2018  . IVDU (intravenous drug user)/Opiates/Cocaine 07/05/2018  . Polysubstance abuse /Cocaine and Opiates 07/05/2018  . Tobacco abuse 07/05/2018    Past Surgical History:  Procedure Laterality Date  . WRIST SURGERY          Home Medications    Prior to Admission medications   Medication Sig Start Date End Date Taking? Authorizing Provider  doxycycline (VIBRAMYCIN) 100 MG capsule Take 1 capsule (100 mg total) by mouth 2 (two) times daily. 07/06/18   Catarina Hartshorn, MD     Family History No family history on file.  Social History Social History   Tobacco Use  . Smoking status: Current Every Day Smoker    Packs/day: 0.50    Types: Cigarettes  . Smokeless tobacco: Never Used  Substance Use Topics  . Alcohol use: No  . Drug use: No     Allergies   Patient has no known allergies.   Review of Systems Review of Systems  Constitutional: Positive for activity change and fever. Negative for fatigue.  HENT: Negative for congestion and rhinorrhea.   Respiratory: Negative for cough and shortness of breath.   Cardiovascular: Negative for chest pain.  Gastrointestinal: Negative for abdominal pain, nausea and vomiting.  Genitourinary: Negative for dysuria and hematuria.  Musculoskeletal: Positive for arthralgias and myalgias.  Skin: Positive for rash and wound.  Neurological: Negative for weakness and headaches.   all other systems are negative except as noted in the HPI and PMH.     Physical Exam Updated Vital Signs BP (!) 131/91 (BP Location: Right Arm)   Pulse 92   Temp 99.1 F (37.3 C) (Oral)   Resp 16   Ht 6' (1.829 m)   Wt 68 kg   SpO2 99%   BMI 20.34 kg/m   Physical Exam Vitals signs and nursing note reviewed.  Constitutional:      General: He  is not in acute distress.    Appearance: He is well-developed.  HENT:     Head: Normocephalic and atraumatic.     Mouth/Throat:     Pharynx: No oropharyngeal exudate.  Eyes:     Conjunctiva/sclera: Conjunctivae normal.     Pupils: Pupils are equal, round, and reactive to light.  Neck:     Musculoskeletal: Normal range of motion and neck supple.     Comments: No meningismus. Cardiovascular:     Rate and Rhythm: Normal rate and regular rhythm.     Heart sounds: Normal heart sounds. No murmur.  Pulmonary:     Effort: Pulmonary effort is normal. No respiratory distress.     Breath sounds: Normal breath sounds.  Chest:     Chest wall: No tenderness.  Abdominal:     Palpations:  Abdomen is soft.     Tenderness: There is no abdominal tenderness. There is no guarding or rebound.  Musculoskeletal: Normal range of motion.        General: Swelling and tenderness present.     Comments: L AC fossa with large fluctuant abscess 2 cm with surrounding erythema.  Elbow held in flexed position.   Skin:    General: Skin is warm.     Capillary Refill: Capillary refill takes less than 2 seconds.     Findings: Erythema and rash present.  Neurological:     General: No focal deficit present.     Mental Status: He is alert and oriented to person, place, and time. Mental status is at baseline.     Cranial Nerves: No cranial nerve deficit.     Motor: No abnormal muscle tone.     Coordination: Coordination normal.     Comments: No ataxia on finger to nose bilaterally. No pronator drift. 5/5 strength throughout. CN 2-12 intact.Equal grip strength. Sensation intact.   Psychiatric:        Behavior: Behavior normal.      ED Treatments / Results  Labs (all labs ordered are listed, but only abnormal results are displayed) Labs Reviewed  COMPREHENSIVE METABOLIC PANEL - Abnormal; Notable for the following components:      Result Value   Potassium 3.4 (*)    Glucose, Bld 103 (*)    BUN 5 (*)    Calcium 8.8 (*)    All other components within normal limits  CBC WITH DIFFERENTIAL/PLATELET  LACTIC ACID, PLASMA  LACTIC ACID, PLASMA    EKG None  Radiology Dg Chest 2 View  Result Date: 07/05/2018 CLINICAL DATA:  Sepsis EXAM: CHEST - 2 VIEW COMPARISON:  None. FINDINGS: The heart size and mediastinal contours are within normal limits. Both lungs are clear. The visualized skeletal structures are unremarkable. IMPRESSION: No active cardiopulmonary disease. Electronically Signed   By: Katherine Mantle M.D.   On: 07/05/2018 21:25    Procedures Procedures (including critical care time)  Medications Ordered in ED Medications  lidocaine-EPINEPHrine (XYLOCAINE-EPINEPHrine) 1  %-1:200000 (PF) injection 30 mL (has no administration in time range)     Initial Impression / Assessment and Plan / ED Course  I have reviewed the triage vital signs and the nursing notes.  Pertinent labs & imaging results that were available during my care of the patient were reviewed by me and considered in my medical decision making (see chart for details).       Patient returns with abscess and cellulitis of his left elbow.  He left AMA earlier today.  He has an obvious abscess with  surrounding cellulitis.  Advised incision and drainage.  Discussed with the patient that it is unknown how deep this abscess goes and he may need further drainage in the operating room.  Patient refuses any drainage attempt in the ED and states he wants a surgeon to do it because he only wants it done once.  Labs will be obtained, IV antibiotics will be started.  X-ray will be obtained to rule out foreign body. Admission to hospitalist service discussed with Dr. Selena BattenKim.  Patient agrees to stay in the hospital today but continues to refuse any incision and drainage in the ED.  He reports he only wants the surgeon to drain his abscess because he only wants this done once.  Advised that the wound would not get any better without incision and drainage.  He continues to refuse.   Final Clinical Impressions(s) / ED Diagnoses   Final diagnoses:  Cellulitis and abscess of upper extremity    ED Discharge Orders    None       Azion Centrella, Jeannett SeniorStephen, MD 07/07/18 [phone]

## 2018-07-07 NOTE — ED Notes (Signed)
Pt asking for food and drink. Pt informed that we have orders for NPO except for sips of water with meds due to possible surgery. Pt states, "that's stupid". RN informed pt of the reason for being NPO and the risk of eating and drinking prior to surgery. Pt continued to argue with RN about this is why he left AMA yesterday because no one would let him eat and he was told it would be 4 hours before the orthopedist saw him but it was really 10 hours. Pt upset about the situation. RN continued to inform pt of risks but pt just continued to get upset. RN left room letting the pt know if that he had any further questions or needs to use the call bell. RN will continue to monitor.

## 2018-07-07 NOTE — H&P (Addendum)
[facility]                                                                                    Patient Demographics  Timothy Christensen, is a 23 y.o. male  CSN: [id number]  MRN: [id number]  DOB - 10/09/1995  Admit Date - 07/07/2018  Outpatient Primary MD for the patient is Patient, No Pcp Per   With History of -  History reviewed. No pertinent past medical history.    Past Surgical History:  Procedure Laterality Date  . WRIST SURGERY      in for   Chief Complaint  Patient presents with  . Abscess     HPI  Timothy Christensen  is a 23 y.o. male, with past medical history significant for IV drug abuse presenting for evaluation of left elbow abscess.  His last IV drug abuse was 3 weeks ago and this problem started around 10 days ago.  [facility] was started on vancomycin and micafungin after blood culture came back positive for yeast.  ID consulted on the patient.  Patient is noncompliant and left AMA x2 times.  He was seen by Dr. Romeo AppleHarrison he needs to be consulted in a.m. for I&D.  Patient denies any chest pains, shortness of breath, nausea vomiting or diarrhea.    Review of Systems    In addition to the HPI above,  No Headache, No changes with Vision or hearing, No problems swallowing food or Liquids, No Chest pain, Cough or Shortness of Breath, No Abdominal pain, No Nausea or Vommitting, Bowel movements are regular, No Blood in stool or Urine, No dysuria, No new skin rashes or bruises, No new joints pains-aches,  No new weakness, tingling, numbness in any extremity, No recent weight gain or loss, No polyuria, polydypsia or polyphagia, No significant Mental Stressors.  A full 10 point Review of Systems was done, except as stated above, all other Review of Systems were negative.   Social History Social History   Tobacco Use  . Smoking status: Current Every Day Smoker    Packs/day: 0.50    Types: Cigarettes  . Smokeless tobacco:  Never Used  Substance Use Topics  . Alcohol use: No     Family History History reviewed. No pertinent family history.   Prior to Admission medications   Medication Sig Start Date End Date Taking? Authorizing Provider  doxycycline (VIBRAMYCIN) 100 MG capsule Take 1 capsule (100 mg total) by mouth 2 (two) times daily. 07/06/18  Yes TatOnalee Hua, David, MD    No Known Allergies  Physical Exam  Vitals  Blood pressure 93/64, pulse 67, temperature 98.7 F (37.1 C), temperature source Oral, resp. rate 16, height 6' (1.829 m), weight 68 kg, SpO2 100 %.   1. General well-developed, well-nourished, very pleasant  2. Normal affect and insight, Not Suicidal or Homicidal, Awake Alert, Oriented X 3.  3. No F.N deficits, grossly, patient moving all extremities.  4. Ears and Eyes appear Normal, Conjunctivae clear, PERRLA. Moist Oral Mucosa.  5. Supple Neck, No JVD, No cervical lymphadenopathy appriciated, No Carotid Bruits.  6. Symmetrical Chest wall movement, Good air movement bilaterally,  CTAB.  7. RRR, No Gallops, Rubs or Murmurs, No Parasternal Heave.  8. Positive Bowel Sounds, Abdomen Soft, Non tender, No organomegaly appriciated,No rebound -guarding or rigidity.  9.  No Cyanosis, Normal Skin Turgor, No Skin Rash or Bruise.  10. Good muscle tone, left elbow joint swollen with a flocculent mass 5 x 5 cm noted.    Data Review  CBC Recent Labs  Lab 07/05/18 2024 07/06/18 0535 07/07/18 0555 07/07/18 1721  WBC 4.7 3.7* 5.8 5.0  HGB 14.3 13.5 13.0 13.3  HCT 44.6 42.4 39.4 41.2  PLT 179 156 194 189  MCV 85.4 87.1 85.1 85.7  MCH 27.4 27.7 28.1 27.7  MCHC 32.1 31.8 33.0 32.3  RDW 15.1 15.3 15.1 15.1  LYMPHSABS 0.3*  --  0.9  --   MONOABS 0.3  --  0.6  --   EOSABS 0.0  --  0.1  --   BASOSABS 0.0  --  0.0  --    ------------------------------------------------------------------------------------------------------------------  Chemistries  Recent Labs  Lab 07/05/18 2024  07/06/18 0535 07/07/18 0555 07/07/18 1721  NA 135 141 141 136  K 3.4* 3.9 3.4* 3.9  CL 100 110 106 106  CO2 GLUCOSE 125* 103* 103* 110*  BUN 9 8 5* <5*  CREATININE 0.91 0.74 0.70 0.82  CALCIUM 8.8* 8.2* 8.8* 8.7*  AST 16  --  16 16  ALT 13  --  16 19  ALKPHOS 78  --  68 74  BILITOT 0.3  --  0.4 0.3   ------------------------------------------------------------------------------------------------------------------ estimated creatinine clearance is 132.5 mL/min (by C-G formula based on SCr of 0.82 mg/dL). ------------------------------------------------------------------------------------------------------------------ No results for input(s): TSH, T4TOTAL, T3FREE, THYROIDAB in the last 72 hours.  Invalid input(s): FREET3   Coagulation profile Recent Labs  Lab 07/05/18 2024  INR 1.1   ------------------------------------------------------------------------------------------------------------------- No results for input(s): DDIMER in the last 72 hours. -------------------------------------------------------------------------------------------------------------------  Cardiac Enzymes No results for input(s): CKMB, TROPONINI, MYOGLOBIN in the last 168 hours.  Invalid input(s): CK ------------------------------------------------------------------------------------------------------------------ Invalid input(s): POCBNP   ---------------------------------------------------------------------------------------------------------------  Urinalysis    Component Value Date/Time   COLORURINE YELLOW 07/05/2018 2024   APPEARANCEUR CLEAR 07/05/2018 2024   LABSPEC 1.013 07/05/2018 2024   PHURINE 6.0 07/05/2018 2024   GLUCOSEU NEGATIVE 07/05/2018 2024   HGBUR NEGATIVE 07/05/2018 2024   BILIRUBINUR NEGATIVE 07/05/2018 2024   KETONESUR NEGATIVE 07/05/2018 2024   PROTEINUR NEGATIVE 07/05/2018 2024   NITRITE NEGATIVE 07/05/2018 2024   LEUKOCYTESUR NEGATIVE 07/05/2018  2024    ----------------------------------------------------------------------------------------------------------------    Imaging results:   Dg Chest 2 View  Result Date: 07/05/2018 CLINICAL DATA:  Sepsis EXAM: CHEST - 2 VIEW COMPARISON:  None. FINDINGS: The heart size and mediastinal contours are within normal limits. Both lungs are clear. The visualized skeletal structures are unremarkable. IMPRESSION: No active cardiopulmonary disease. Electronically Signed   By: Katherine Mantle M.D.   On: 07/05/2018 21:25   Dg Elbow Complete Left  Result Date: 07/07/2018 CLINICAL DATA:  Medial soft tissue swelling.  Rule out abscess. EXAM: LEFT ELBOW - COMPLETE 3+ VIEW COMPARISON:  None. FINDINGS: Normal alignment. No fracture or arthropathy. Joint spaces normal. Negative for osteomyelitis. No foreign body in the soft tissues. No gas in the soft tissues. Soft tissue swelling proximal medial forearm. IMPRESSION: Soft tissue swelling otherwise negative Electronically Signed   By: Marlan Palau M.D.   On: 07/07/2018 07:25      Assessment & Plan  Sepsis /left elbow abscess Blood culture positive for  yeast on 5/21 Continue with IV vancomycin and Eraxis as per last admission Check MRI of left elbow Consult Dr. Romeo Apple in a.m. for I&D ID consult in AM  IV drug abuse, Suboxone With history of cocaine abuse as well Discussed cessation  Noncompliance Patient left [facility]MA     DVT Prophylaxis Lovenox  AM Labs Ordered, also please review Full Orders  Code Status full  Disposition Plan: Home  Time spent in minutes : 41 minutes  Condition GUARDED   @SIGNATURE @

## 2018-07-07 NOTE — ED Notes (Signed)
CRITICAL VALUE ALERT  Critical Value:  Anaerobic Blood Culture from 07/06/18 positive for yeast  Date & Time Notied:  07/07/18 0945  Provider Notified: Dr. Sherryll Burger  Orders Received/Actions taken: MD notified

## 2018-07-07 NOTE — Discharge Summary (Signed)
Physician Discharge Summary  Rosalio Catterton ZOX:[id number] DOB: [birth year] DOA: 07/07/2018  PCP: Patient, No Pcp Per  Admit date: 07/07/2018  Discharge date: 07/07/2018 [facility] Discharge  Admitted From:Home  Disposition: AMA  Discharge Condition: AMA  CODE STATUS: Full  Brief/Interim Summary: Per HPI: Equan Cogbill is a 23 y.o. male with medical history significant for polysubstance abuse to include Suboxone, tobacco, and cocaine who presented back to the ED after leaving AMA on 5/22 for worsening erythema, swelling, and pain to his left elbow.  Please refer to discharge summary dictated by Dr. Arbutus Leas on 5/22 for full details.  He was seen by orthopedic surgery during that admission with recommendation for MRI and continuation of vancomycin and likely need for surgery with I&D for full treatment.  Unfortunately, patient left AMA because he was left n.p.o. and could not eat.  He was prescribed doxycycline prior to leaving, but he continued to have worsening pain which prompted him to return back to the ED today.  He denies any fevers or chills or other symptomatology.  He cannot fully flex or extend his elbow due to the severity of his pain.  Patient was readmitted for his left upper extremity abscess and cellulitis and was initially started on IV vancomycin.  His blood cultures from prior admission demonstrated presence of Candida for which ID became involved and started patient on Eraxis.  He received pain medications to include IV fentanyl, Dilaudid, as well as some tramadol and was awaiting transfer to [facility].  Upon [facility] arrival, patient stated that he wanted some food and would like to go to smoke outside.  He was told that he may have a meal tray as well as a nicotine patch, but refused at this time and stated that he would like to go outside to smoke a cigarette and plans to go to [facility] emergency department later this evening for reevaluation.  He understands the gravity of his situation and  knows that he has bandemia and requires treatment.  He is alert and oriented x3 and has competency to make his decision.  He is currently stable aside from some soft blood pressure readings.  He has signed AMA form and will be discharged from ED at this time.  Discharge Diagnoses:  Principal Problem:   Abscess Active Problems:   Polysubstance abuse /Cocaine and Opiates   Tobacco abuse  Principal discharge diagnosis: Left upper extremity cellulitis/abscess with fungemia.   No Known Allergies  Consultations:  ID    Procedures/Studies: Dg Chest 2 View  Result Date: 07/05/2018 CLINICAL DATA:  Sepsis EXAM: CHEST - 2 VIEW COMPARISON:  None. FINDINGS: The heart size and mediastinal contours are within normal limits. Both lungs are clear. The visualized skeletal structures are unremarkable. IMPRESSION: No active cardiopulmonary disease. Electronically Signed   By: Katherine Mantle M.D.   On: 07/05/2018 21:25   Dg Elbow Complete Left  Result Date: 07/07/2018 CLINICAL DATA:  Medial soft tissue swelling.  Rule out abscess. EXAM: LEFT ELBOW - COMPLETE 3+ VIEW COMPARISON:  None. FINDINGS: Normal alignment. No fracture or arthropathy. Joint spaces normal. Negative for osteomyelitis. No foreign body in the soft tissues. No gas in the soft tissues. Soft tissue swelling proximal medial forearm. IMPRESSION: Soft tissue swelling otherwise negative Electronically Signed   By: Marlan Palau M.D.   On: 07/07/2018 07:25     Discharge Exam: Vitals:   07/07/18 1300 07/07/18 1330  BP: 110/74 (!) 89/58  Pulse: 70   Resp:    Temp:  SpO2: 100%    Vitals:   07/07/18 1200 07/07/18 1230 07/07/18 1300 07/07/18 1330  BP: 104/68 106/69 110/74 (!) 89/58  Pulse: 63 67 70   Resp:      Temp:      TempSrc:      SpO2: 100% 93% 100%   Weight:      Height:        General: Pt is alert, awake, not in acute distress Cardiovascular: RRR, S1/S2 +, no rubs, no gallops Respiratory: CTA bilaterally, no  wheezing, no rhonchi Abdominal: Soft, NT, ND, bowel sounds + Extremities: no edema, no cyanosis, left elbow with erythema, tenderness, and localized swelling with fluctuance to medial epicondyle of elbow.    The results of significant diagnostics from this hospitalization (including imaging, microbiology, ancillary and laboratory) are listed below for reference.     Microbiology: Recent Results (from the past 240 hour(s))  Culture, blood (Routine x 2)     Status: None (Preliminary result)   Collection Time: 07/05/18  8:24 PM  Result Value Ref Range Status   Specimen Description   Final    BLOOD RIGHT FOREARM Performed at [facility], [address] [place], [place] [address]    Special Requests   Final    BOTTLES DRAWN AEROBIC AND ANAEROBIC Blood Culture adequate volume Performed at [facility], [address], [place], [place] [address]    Culture  Setup Time   Final    YEAST ANAEROBIC BOTTLE Gram Stain Report Called to,Read Back By and Verified With: WHITE,M@[id number]  BY MATTHEWS, B 5.23.2020 AEROBIC BOTTLE YEAST CRITICAL RESULT CALLED TO, READ BACK BY AND VERIFIED WITH: T. MEYER, PHARMD (APH) AT 1325 ON 07/07/18 BY C. JESSUP, MLT.    Culture   Final    YEAST CULTURE REINCUBATED FOR BETTER GROWTH Performed at [facility], [address] [place], [place] [address]    Report Status PENDING  Incomplete  Blood Culture ID Panel (Reflexed)     Status: Abnormal   Collection Time: 07/05/18  8:24 PM  Result Value Ref Range Status   Enterococcus species NOT DETECTED NOT DETECTED Final   Listeria monocytogenes NOT DETECTED NOT DETECTED Final   Staphylococcus species NOT DETECTED NOT DETECTED Final   Staphylococcus aureus (BCID) NOT DETECTED NOT DETECTED Final   Streptococcus species NOT DETECTED NOT DETECTED Final   Streptococcus agalactiae NOT DETECTED NOT DETECTED Final   Streptococcus pneumoniae NOT DETECTED NOT DETECTED Final   Streptococcus pyogenes NOT DETECTED NOT  DETECTED Final   Acinetobacter baumannii NOT DETECTED NOT DETECTED Final   Enterobacteriaceae species NOT DETECTED NOT DETECTED Final   Enterobacter cloacae complex NOT DETECTED NOT DETECTED Final   Escherichia coli NOT DETECTED NOT DETECTED Final   Klebsiella oxytoca NOT DETECTED NOT DETECTED Final   Klebsiella pneumoniae NOT DETECTED NOT DETECTED Final   Proteus species NOT DETECTED NOT DETECTED Final   Serratia marcescens NOT DETECTED NOT DETECTED Final   Haemophilus influenzae NOT DETECTED NOT DETECTED Final   Neisseria meningitidis NOT DETECTED NOT DETECTED Final   Pseudomonas aeruginosa NOT DETECTED NOT DETECTED Final   Candida albicans DETECTED (A) NOT DETECTED Final    Comment: CRITICAL RESULT CALLED TO, READ BACK BY AND VERIFIED WITH: T. MEYER, PHARMD (APH) AT 1325 ON 07/07/18 BY C. JESSUP, MLT.    Candida glabrata NOT DETECTED NOT DETECTED Final   Candida krusei NOT DETECTED NOT DETECTED Final   Candida parapsilosis NOT DETECTED NOT DETECTED Final   Candida tropicalis NOT DETECTED NOT  DETECTED Final    Comment: Performed at [facility], [address], [place], [place] [address]  Culture, blood (Routine x 2)     Status: None (Preliminary result)   Collection Time: 07/05/18  8:29 PM  Result Value Ref Range Status   Specimen Description BLOOD  Final   Special Requests NONE  Final   Culture   Final    NO GROWTH 2 DAYS Performed at [facility], [address] [place], [place] [address]    Report Status PENDING  Incomplete  SARS Coronavirus 2 (CEPHEID - Performed in [facility] hospital lab), Hosp Order     Status: None   Collection Time: 07/05/18  9:01 PM  Result Value Ref Range Status   SARS Coronavirus 2 NEGATIVE NEGATIVE Final    Comment: (NOTE) If result is NEGATIVE SARS-CoV-2 target nucleic acids are NOT DETECTED. The SARS-CoV-2 RNA is generally detectable in upper and lower  respiratory specimens during the acute phase of infection. The lowest  concentration  of SARS-CoV-2 viral copies this assay can detect is 250  copies / mL. A negative result does not preclude SARS-CoV-2 infection  and should not be used as the sole basis for treatment or other  patient management decisions.  A negative result may occur with  improper specimen collection / handling, submission of specimen other  than nasopharyngeal swab, presence of viral mutation(s) within the  areas targeted by this assay, and inadequate number of viral copies  (<250 copies / mL). A negative result must be combined with clinical  observations, patient history, and epidemiological information. If result is POSITIVE SARS-CoV-2 target nucleic acids are DETECTED. The SARS-CoV-2 RNA is generally detectable in upper and lower  respiratory specimens dur ing the acute phase of infection.  Positive  results are indicative of active infection with SARS-CoV-2.  Clinical  correlation with patient history and other diagnostic information is  necessary to determine patient infection status.  Positive results do  not rule out bacterial infection or co-infection with other viruses. If result is PRESUMPTIVE POSTIVE SARS-CoV-2 nucleic acids MAY BE PRESENT.   A presumptive positive result was obtained on the submitted specimen  and confirmed on repeat testing.  While 2019 novel coronavirus  (SARS-CoV-2) nucleic acids may be present in the submitted sample  additional confirmatory testing may be necessary for epidemiological  and / or clinical management purposes  to differentiate between  SARS-CoV-2 and other Sarbecovirus currently known to infect humans.  If clinically indicated additional testing with an alternate test  methodology [phone] is advised. The SARS-CoV-2 RNA is generally  detectable in upper and lower respiratory sp ecimens during the acute  phase of infection. The expected result is Negative. Fact Sheet for Patients:  BoilerBrush.com.cy Fact Sheet for Healthcare  Providers: https://pope.com/ This test is not yet approved or cleared by the [place] FDA and has been authorized for detection and/or diagnosis of SARS-CoV-2 by FDA under an Emergency Use Authorization (EUA).  This EUA will remain in effect (meaning this test can be used) for the duration of the COVID-19 declaration under Section 564(b)(1) of the Act, 21 U.S.C. section 360bbb-3(b)(1), unless the authorization is terminated or revoked sooner. Performed at [facility], [address] [place], [place] [address]   SARS Coronavirus 2 (CEPHEID - Performed in [facility] hospital lab), Hosp Order     Status: None   Collection Time: 07/07/18 10:38 AM  Result Value Ref Range Status   SARS Coronavirus 2 NEGATIVE NEGATIVE Final  Comment: (NOTE) If result is NEGATIVE SARS-CoV-2 target nucleic acids are NOT DETECTED. The SARS-CoV-2 RNA is generally detectable in upper and lower  respiratory specimens during the acute phase of infection. The lowest  concentration of SARS-CoV-2 viral copies this assay can detect is 250  copies / mL. A negative result does not preclude SARS-CoV-2 infection  and should not be used as the sole basis for treatment or other  patient management decisions.  A negative result may occur with  improper specimen collection / handling, submission of specimen other  than nasopharyngeal swab, presence of viral mutation(s) within the  areas targeted by this assay, and inadequate number of viral copies  (<250 copies / mL). A negative result must be combined with clinical  observations, patient history, and epidemiological information. If result is POSITIVE SARS-CoV-2 target nucleic acids are DETECTED. The SARS-CoV-2 RNA is generally detectable in upper and lower  respiratory specimens dur ing the acute phase of infection.  Positive  results are indicative of active infection with SARS-CoV-2.  Clinical  correlation with patient history and other  diagnostic information is  necessary to determine patient infection status.  Positive results do  not rule out bacterial infection or co-infection with other viruses. If result is PRESUMPTIVE POSTIVE SARS-CoV-2 nucleic acids MAY BE PRESENT.   A presumptive positive result was obtained on the submitted specimen  and confirmed on repeat testing.  While 2019 novel coronavirus  (SARS-CoV-2) nucleic acids may be present in the submitted sample  additional confirmatory testing may be necessary for epidemiological  and / or clinical management purposes  to differentiate between  SARS-CoV-2 and other Sarbecovirus currently known to infect humans.  If clinically indicated additional testing with an alternate test  methodology [phone]) is advised. The SARS-CoV-2 RNA is generally  detectable in upper and lower respiratory sp ecimens during the acute  phase of infection. The expected result is Negative. Fact Sheet for Patients:  BoilerBrush.com.cy Fact Sheet for Healthcare Providers: https://pope.com/ This test is not yet approved or cleared by the [place] FDA and has been authorized for detection and/or diagnosis of SARS-CoV-2 by FDA under an Emergency Use Authorization (EUA).  This EUA will remain in effect (meaning this test can be used) for the duration of the COVID-19 declaration under Section 564(b)(1) of the Act, 21 U.S.C. section 360bbb-3(b)(1), unless the authorization is terminated or revoked sooner. Performed at  [facility], [address], [place], [place] [address]      Labs: BNP (last 3 results) No results for input(s): BNP in the last 8760 hours. Basic Metabolic Panel: Recent Labs  Lab 07/05/18 2024 07/06/18 0535 07/07/18 0555  NA 135 141 141  K 3.4* 3.9 3.4*  CL 100 110 106  CO2 GLUCOSE 125* 103* 103*  BUN 9 8 5*  CREATININE 0.91 0.74 0.70  CALCIUM 8.8* 8.2* 8.8*   Liver Function Tests: Recent Labs   Lab 07/05/18 2024 07/07/18 0555  AST 16 16  ALT 13 16  ALKPHOS 78 68  BILITOT 0.3 0.4  PROT 7.1 6.5  ALBUMIN 3.9 3.6   No results for input(s): LIPASE, AMYLASE in the last 168 hours. No results for input(s): AMMONIA in the last 168 hours. CBC: Recent Labs  Lab 07/05/18 2024 07/06/18 0535 07/07/18 0555  WBC 4.7 3.7* 5.8  NEUTROABS 4.1  --  4.2  HGB 14.3 13.5 13.0  HCT 44.6 42.4 39.4  MCV 85.4 87.1 85.1  PLT 179 156 194   Cardiac Enzymes:  No results for input(s): CKTOTAL, CKMB, CKMBINDEX, TROPONINI in the last 168 hours. BNP: Invalid input(s): POCBNP CBG: Recent Labs  Lab 07/06/18 0716  GLUCAP 125*   D-Dimer No results for input(s): DDIMER in the last 72 hours. Hgb A1c No results for input(s): HGBA1C in the last 72 hours. Lipid Profile No results for input(s): CHOL, HDL, LDLCALC, TRIG, CHOLHDL, LDLDIRECT in the last 72 hours. Thyroid function studies No results for input(s): TSH, T4TOTAL, T3FREE, THYROIDAB in the last 72 hours.  Invalid input(s): FREET3 Anemia work up No results for input(s): VITAMINB12, FOLATE, FERRITIN, TIBC, IRON, RETICCTPCT in the last 72 hours. Urinalysis    Component Value Date/Time   COLORURINE YELLOW 07/05/2018 2024   APPEARANCEUR CLEAR 07/05/2018 2024   LABSPEC 1.013 07/05/2018 2024   PHURINE 6.0 07/05/2018 2024   GLUCOSEU NEGATIVE 07/05/2018 2024   HGBUR NEGATIVE 07/05/2018 2024   BILIRUBINUR NEGATIVE 07/05/2018 2024   KETONESUR NEGATIVE 07/05/2018 2024   PROTEINUR NEGATIVE 07/05/2018 2024   NITRITE NEGATIVE 07/05/2018 2024   LEUKOCYTESUR NEGATIVE 07/05/2018 2024   Sepsis Labs Invalid input(s): PROCALCITONIN,  WBC,  LACTICIDVEN Microbiology Recent Results (from the past 240 hour(s))  Culture, blood (Routine x 2)     Status: None (Preliminary result)   Collection Time: 07/05/18  8:24 PM  Result Value Ref Range Status   Specimen Description   Final    BLOOD RIGHT FOREARM Performed at [facility], [address]  [place], [place] [address]    Special Requests   Final    BOTTLES DRAWN AEROBIC AND ANAEROBIC Blood Culture adequate volume Performed at [facility], [address] [place], [place] [address]    Culture  Setup Time   Final    YEAST ANAEROBIC BOTTLE Gram Stain Report Called to,Read Back By and Verified With: WHITE,M@[id number]  BY MATTHEWS, B 5.23.2020 AEROBIC BOTTLE YEAST CRITICAL RESULT CALLED TO, READ BACK BY AND VERIFIED WITH: T. MEYER, PHARMD (APH) AT 1325 ON 07/07/18 BY C. JESSUP, MLT.    Culture   Final    YEAST CULTURE REINCUBATED FOR BETTER GROWTH Performed at [facility], [address] [place], [place] [address]    Report Status PENDING  Incomplete  Blood Culture ID Panel (Reflexed)     Status: Abnormal   Collection Time: 07/05/18  8:24 PM  Result Value Ref Range Status   Enterococcus species NOT DETECTED NOT DETECTED Final   Listeria monocytogenes NOT DETECTED NOT DETECTED Final   Staphylococcus species NOT DETECTED NOT DETECTED Final   Staphylococcus aureus (BCID) NOT DETECTED NOT DETECTED Final   Streptococcus species NOT DETECTED NOT DETECTED Final   Streptococcus agalactiae NOT DETECTED NOT DETECTED Final   Streptococcus pneumoniae NOT DETECTED NOT DETECTED Final   Streptococcus pyogenes NOT DETECTED NOT DETECTED Final   Acinetobacter baumannii NOT DETECTED NOT DETECTED Final   Enterobacteriaceae species NOT DETECTED NOT DETECTED Final   Enterobacter cloacae complex NOT DETECTED NOT DETECTED Final   Escherichia coli NOT DETECTED NOT DETECTED Final   Klebsiella oxytoca NOT DETECTED NOT DETECTED Final   Klebsiella pneumoniae NOT DETECTED NOT DETECTED Final   Proteus species NOT DETECTED NOT DETECTED Final   Serratia marcescens NOT DETECTED NOT DETECTED Final   Haemophilus influenzae NOT DETECTED NOT DETECTED Final   Neisseria meningitidis NOT DETECTED NOT DETECTED Final   Pseudomonas aeruginosa NOT DETECTED NOT DETECTED Final   Candida albicans DETECTED (A) NOT  DETECTED Final    Comment: CRITICAL RESULT CALLED TO, READ BACK BY AND VERIFIED WITH: T.  MEYER, PHARMD (APH) AT 1325 ON 07/07/18 BY C. JESSUP, MLT.    Candida glabrata NOT DETECTED NOT DETECTED Final   Candida krusei NOT DETECTED NOT DETECTED Final   Candida parapsilosis NOT DETECTED NOT DETECTED Final   Candida tropicalis NOT DETECTED NOT DETECTED Final    Comment: Performed at [facility], [address] [place], [place] [address]  Culture, blood (Routine x 2)     Status: None (Preliminary result)   Collection Time: 07/05/18  8:29 PM  Result Value Ref Range Status   Specimen Description BLOOD  Final   Special Requests NONE  Final   Culture   Final    NO GROWTH 2 DAYS Performed at [facility], [address] [place], [place] [address]    Report Status PENDING  Incomplete  SARS Coronavirus 2 (CEPHEID - Performed in [facility] hospital lab), Hosp Order     Status: None   Collection Time: 07/05/18  9:01 PM  Result Value Ref Range Status   SARS Coronavirus 2 NEGATIVE NEGATIVE Final    Comment: (NOTE) If result is NEGATIVE SARS-CoV-2 target nucleic acids are NOT DETECTED. The SARS-CoV-2 RNA is generally detectable in upper and lower  respiratory specimens during the acute phase of infection. The lowest  concentration of SARS-CoV-2 viral copies this assay can detect is 250  copies / mL. A negative result does not preclude SARS-CoV-2 infection  and should not be used as the sole basis for treatment or other  patient management decisions.  A negative result may occur with  improper specimen collection / handling, submission of specimen other  than nasopharyngeal swab, presence of viral mutation(s) within the  areas targeted by this assay, and inadequate number of viral copies  (<250 copies / mL). A negative result must be combined with clinical  observations, patient history, and epidemiological information. If result is POSITIVE SARS-CoV-2 target nucleic acids are  DETECTED. The SARS-CoV-2 RNA is generally detectable in upper and lower  respiratory specimens dur ing the acute phase of infection.  Positive  results are indicative of active infection with SARS-CoV-2.  Clinical  correlation with patient history and other diagnostic information is  necessary to determine patient infection status.  Positive results do  not rule out bacterial infection or co-infection with other viruses. If result is PRESUMPTIVE POSTIVE SARS-CoV-2 nucleic acids MAY BE PRESENT.   A presumptive positive result was obtained on the submitted specimen  and confirmed on repeat testing.  While 2019 novel coronavirus  (SARS-CoV-2) nucleic acids may be present in the submitted sample  additional confirmatory testing may be necessary for epidemiological  and / or clinical management purposes  to differentiate between  SARS-CoV-2 and other Sarbecovirus currently known to infect humans.  If clinically indicated additional testing with an alternate test  methodology [phone]) is advised. The SARS-CoV-2 RNA is generally  detectable in upper and lower respiratory sp ecimens during the acute  phase of infection. The expected result is Negative. Fact Sheet for Patients:  BoilerBrush.com.cy Fact Sheet for Healthcare Providers: https://pope.com/ This test is not yet approved or cleared by the [place] FDA and has been authorized for detection and/or diagnosis of SARS-CoV-2 by FDA under an Emergency Use Authorization (EUA).  This EUA will remain in effect (meaning this test can be used) for the duration of the COVID-19 declaration under Section 564(b)(1) of the Act, 21 U.S.C. section 360bbb-3(b)(1), unless the authorization is terminated or revoked sooner. Performed at [facility], [address],  [place], [place] [address]   SARS Coronavirus 2 (CEPHEID - Performed in [facility] hospital lab), Hosp Order     Status: None    Collection Time: 07/07/18 10:38 AM  Result Value Ref Range Status   SARS Coronavirus 2 NEGATIVE NEGATIVE Final    Comment: (NOTE) If result is NEGATIVE SARS-CoV-2 target nucleic acids are NOT DETECTED. The SARS-CoV-2 RNA is generally detectable in upper and lower  respiratory specimens during the acute phase of infection. The lowest  concentration of SARS-CoV-2 viral copies this assay can detect is 250  copies / mL. A negative result does not preclude SARS-CoV-2 infection  and should not be used as the sole basis for treatment or other  patient management decisions.  A negative result may occur with  improper specimen collection / handling, submission of specimen other  than nasopharyngeal swab, presence of viral mutation(s) within the  areas targeted by this assay, and inadequate number of viral copies  (<250 copies / mL). A negative result must be combined with clinical  observations, patient history, and epidemiological information. If result is POSITIVE SARS-CoV-2 target nucleic acids are DETECTED. The SARS-CoV-2 RNA is generally detectable in upper and lower  respiratory specimens dur ing the acute phase of infection.  Positive  results are indicative of active infection with SARS-CoV-2.  Clinical  correlation with patient history and other diagnostic information is  necessary to determine patient infection status.  Positive results do  not rule out bacterial infection or co-infection with other viruses. If result is PRESUMPTIVE POSTIVE SARS-CoV-2 nucleic acids MAY BE PRESENT.   A presumptive positive result was obtained on the submitted specimen  and confirmed on repeat testing.  While 2019 novel coronavirus  (SARS-CoV-2) nucleic acids may be present in the submitted sample  additional confirmatory testing may be necessary for epidemiological  and / or clinical management purposes  to differentiate between  SARS-CoV-2 and other Sarbecovirus currently known to infect humans.   If clinically indicated additional testing with an alternate test  methodology [phone] is advised. The SARS-CoV-2 RNA is generally  detectable in upper and lower respiratory sp ecimens during the acute  phase of infection. The expected result is Negative. Fact Sheet for Patients:  BoilerBrush.com.cy Fact Sheet for Healthcare Providers: https://pope.com/ This test is not yet approved or cleared by the [place] FDA and has been authorized for detection and/or diagnosis of SARS-CoV-2 by FDA under an Emergency Use Authorization (EUA).  This EUA will remain in effect (meaning this test can be used) for the duration of the COVID-19 declaration under Section 564(b)(1) of the Act, 21 U.S.C. section 360bbb-3(b)(1), unless the authorization is terminated or revoked sooner. Performed at [facility], [address], [place], [place] [address]      Time coordinating discharge: 35 minutes  SIGNED:   Erick Blinks, DO [facility] 07/07/2018, 2:13 PM  If 7PM-7AM, please contact night-coverage [email or web address] Password [email or web address]

## 2018-07-07 NOTE — ED Triage Notes (Signed)
Pt from home w/ a c/o left arm abscess that he has had for 1 1/2 weeks. Abscess site is pink, inflamed, and hot to touch. He reports that he had blood cultures and lab work recently collected.

## 2018-07-07 NOTE — Progress Notes (Signed)
Pharmacy Antibiotic Note  Timothy Christensen is a 23 y.o. male with hx IVDU who presented to the [facility] on 07/07/18 with LUE cellulitis/abscess. Pharmacy has been consulted for Vancomycin dosing.   The patient was at [facility] earlier today and was started on Vancomycin 1g/8h and received 1 dose around 0930. The patient was also noted to have positive blood cultures on 5/21 that resulted today as 1/2 Candida Albicans and the patient was started on Eraxis with the first dose of 200 mg given at 1230. After which, the patient left AMA and has since presented to [facility].    Vancomycin 1000 mg IV Q 8 hrs. Goal AUC 400-550. Expected AUC: 471.9 SCr used: 0.82  Plan: - Resume Vancomycin 1g IV every 8 hours (next dose due at 1800) - Eraxis resumed per MD - ID will be automatically consulted and likely can narrow to Fluconazole - Will continue to follow renal function, culture results, LOT, and antibiotic de-escalation plans   Height: 6' (182.9 cm) Weight: 150 lb (68 kg) IBW/kg (Calculated) : 77.6  Temp (24hrs), Avg:98.9 F (37.2 C), Min:98.7 F (37.1 C), Max:99.1 F (37.3 C)  Recent Labs  Lab 07/05/18 2024 07/05/18 2253 07/06/18 0535 07/07/18 0555 07/07/18 0813 07/07/18 1721  WBC 4.7  --  3.7* 5.8  --  5.0  CREATININE 0.91  --  0.74 0.70  --  0.82  LATICACIDVEN 2.1* 1.1  --  1.6 1.3  --     Estimated Creatinine Clearance: 132.5 mL/min (by C-G formula based on SCr of 0.82 mg/dL).    No Known Allergies  Antimicrobials this admission: Vanc 5/23 >> Eraxis 5/23 >>  Dose adjustments this admission: n/a  Microbiology results: 5/21 BCx >> 1/2 Candida Albicans 5/23 BCx >>  Thank you for allowing pharmacy to be a part of this patient's care.  Georgina Pillion, PharmD, BCPS Clinical Pharmacist Clinical phone for 07/07/2018: [phone] 07/07/2018 8:18 PM   **Pharmacist phone directory can now be found on [email or web address] (PW [email or web address]).  Listed under [facility].

## 2018-07-07 NOTE — ED Triage Notes (Signed)
Pt here for left arm pain, pt left AMA yesterday afternoon after being admitted for the same. Pt now back due to pain and swelling getting worse since leaving.

## 2018-07-07 NOTE — Plan of Care (Signed)
  Problem: Education: Goal: Knowledge of General Education information will improve Description Including pain rating scale, medication(s)/side effects and non-pharmacologic comfort measures Outcome: Progressing   

## 2018-07-07 NOTE — Progress Notes (Addendum)
23  Yo male with h/o substance abuse, has abscess on the left AC,  Left AMA previously ED requesting admission for IV abx, and possible I+D by Romeo Apple Please contact orthopedics

## 2018-07-07 NOTE — ED Notes (Signed)
[facility] loaded pt onto their stretcher for transport. [facility] EMT came to RN and said pt is wanting to leave AMA. RN went to room and pt says he wants to "eat and smoke". Dr. Sherryll Burger paged to notify him of pt wanting to leave AMA. Dr. Sherryll Burger offered to switch pt's diet order from NPO to a regular diet and give him a Nicotine patch but pt continued to refuse. Pt states, "I want to smoke". Pt informed that we are a smoke free facility and we cannot allow smoking on the premises. Pt very upset and says he is leaving and going to "eat, smoke, and then I'll drive myself to [facility] and they'll get me right back once they realize I'm septic." Pt also upset about his IV placement on posterior side of right forearm. Pt states, "and I really don't like where this IV is at". Pt informed that he has a fungal infection in his bloodstream and pt is still wanting to leave AMA. Dr. Sherryll Burger notified of pt's last BP-89/58 prior to him leaving. Pt was willing to sign AMA paperwork and asked for directions to [facility]. RN attempted to give directions verbally but pt just left prior to getting a print out.

## 2018-07-07 NOTE — ED Provider Notes (Signed)
[facility] Provider Note MRN:  [id number]  Arrival date & time: 07/07/18     Chief Complaint   Abscess   History of Present Illness   Timothy Christensen is a 23 y.o. year-old male with a history of IV drug use presenting to the ED with chief complaint of abscess.  Patient has had 2 recent admissions for cellulitis and abscess of the left antecubital region related to IV drug use.  Left AMA both times, including today.  Was at [facility] when he decided to come to this hospital instead, explaining that the staff there was not very nice or receptive to his pain medicine requests.  Endorses fever 2 or 3 days ago, but not since being started on antibiotics in the hospital.  Pain in the left arm is constant, moderate in severity, worse with motion or palpation.  Review of Systems  A complete 10 system review of systems was obtained and all systems are negative except as noted in the HPI and PMH.   Patient's Health History   History reviewed. No pertinent past medical history.  Past Surgical History:  Procedure Laterality Date  . WRIST SURGERY      History reviewed. No pertinent family history.  Social History   Socioeconomic History  . Marital status: Single    Spouse name: Not on file  . Number of children: Not on file  . Years of education: Not on file  . Highest education level: Not on file  Occupational History  . Not on file  Social Needs  . Financial resource strain: Patient refused  . Food insecurity:    Worry: Patient refused    Inability: Patient refused  . Transportation needs:    Medical: Patient refused    Non-medical: Patient refused  Tobacco Use  . Smoking status: Current Every Day Smoker    Packs/day: 0.50    Types: Cigarettes  . Smokeless tobacco: Never Used  Substance and Sexual Activity  . Alcohol use: No  . Drug use: Yes    Types: Marijuana  . Sexual activity: Yes    Birth control/protection: Condom   Lifestyle  . Physical activity:    Days per week: Patient refused    Minutes per session: Patient refused  . Stress: Patient refused  Relationships  . Social connections:    Talks on phone: Patient refused    Gets together: Patient refused    Attends religious service: Patient refused    Active member of club or organization: Patient refused    Attends meetings of clubs or organizations: Patient refused    Relationship status: Patient refused  . Intimate partner violence:    Fear of current or ex partner: Patient refused    Emotionally abused: Patient refused    Physically abused: Patient refused    Forced sexual activity: Patient refused  Other Topics Concern  . Not on file  Social History Narrative  . Not on file     Physical Exam  Vital Signs and Nursing Notes reviewed Vitals:   07/07/18 1649 07/07/18 1730  BP: 125/77 111/81  Pulse: 89 (!) 109  Resp: 16   Temp: 98.7 F (37.1 C)   SpO2: 99% 99%    CONSTITUTIONAL: Chronically ill-appearing, NAD NEURO:  Alert and oriented x 3, no focal deficits EYES:  eyes equal and reactive ENT/NECK:  no LAD, no JVD CARDIO: Regular rate, well-perfused, normal S1 and S2 PULM:  CTAB no wheezing or rhonchi GI/GU:  normal  bowel sounds, non-distended, non-tender MSK/SPINE:  No gross deformities, no edema SKIN: 4 to 5 cm raised fluctuant abscess to the left antecubital region with surrounding erythema and tenderness to palpation PSYCH:  Appropriate speech and behavior  Diagnostic and Interventional Summary    Labs Reviewed  CULTURE, BLOOD (SINGLE)  CBC  COMPREHENSIVE METABOLIC PANEL    No orders to display    Medications  HYDROmorphone (DILAUDID) injection 1 mg (has no administration in time range)  sodium chloride 0.9 % bolus 1,000 mL (1,000 mLs Intravenous New Bag/Given 07/07/18 1748)  HYDROmorphone (DILAUDID) injection 0.5 mg (0.5 mg Intravenous Given 07/07/18 1748)     Procedures Critical Care  ED Course and Medical  Decision Making  I have reviewed the triage vital signs and the nursing notes.  Pertinent labs & imaging results that were available during my care of the patient were reviewed by me and considered in my medical decision making (see below for details).  Continued cellulitis and abscess related to IV drug use in this 23 year old male, who is unfortunately leaving AMA and coming to different hospitals.  Will admit to hospital service for continued antibiotics.  Discussed case with [facility] hand surgeon on-call Dr. Katherine Roan, who defers OR management to Dr. Romeo Apple who has already evaluated the patient.  Accepted for admission by hospitalist.  Elmer Sow. Pilar Plate, MD [facility] Emergency Medicine [facility] [email or web address]  Final Clinical Impressions(s) / ED Diagnoses     ICD-10-CM   1. Abscess of left axilla L02.412   2. IV drug user F19.90   3. Cellulitis of left upper extremity L03.114     ED Discharge Orders    None         Sabas Sous, MD 07/07/18 [id number]

## 2018-07-07 NOTE — H&P (Addendum)
History and Physical    Dmichael Wesler OYD:[id number] DOB: 24-Aug-1995 DOA: 07/07/2018  PCP: Patient, No Pcp Per   Patient coming from: Home  Chief Complaint: L elbow swelling and pain  HPI: Timothy Christensen is a 23 y.o. male with medical history significant for polysubstance abuse to include Suboxone, tobacco, and cocaine who presented back to the ED after leaving AMA on 5/22 for worsening erythema, swelling, and pain to his left elbow.  Please refer to discharge summary dictated by Dr. Arbutus Leas on 5/22 for full details.  He was seen by orthopedic surgery during that admission with recommendation for MRI and continuation of vancomycin and likely need for surgery with I&D for full treatment.  Unfortunately, patient left AMA because he was left n.p.o. and could not eat.  He was prescribed doxycycline prior to leaving, but he continued to have worsening pain which prompted him to return back to the ED today.  He denies any fevers or chills or other symptomatology.  He cannot fully flex or extend his elbow due to the severity of his pain.   ED Course: Vital signs are currently stable and laboratory data with potassium 3.4, but no other significant abnormalities noted.  He has been started on IV vancomycin with blood cultures obtained.  He does require MRI and further orthopedic surgery evaluation which are both currently not available at any time this weekend.  He will be transferred to [facility] for further imaging and consultation orthopedics for evaluation.  Review of Systems: All others reviewed and otherwise negative.  History reviewed. No pertinent past medical history.  Past Surgical History:  Procedure Laterality Date  . WRIST SURGERY       reports that he has been smoking cigarettes. He has been smoking about 0.50 packs per day. He has never used smokeless tobacco. He reports that he does not drink alcohol or use drugs.  No Known Allergies  No family history on file.  Prior to Admission  medications   Medication Sig Start Date End Date Taking? Authorizing Provider  doxycycline (VIBRAMYCIN) 100 MG capsule Take 1 capsule (100 mg total) by mouth 2 (two) times daily. 07/06/18   Catarina Hartshorn, MD    Physical Exam: Vitals:   07/07/18 0800 07/07/18 0830 07/07/18 0900 07/07/18 0930  BP: 107/60 110/61 109/67 112/73  Pulse: 75 67 69 72  Resp:      Temp:      TempSrc:      SpO2: 99% 99% 98% 100%  Weight:      Height:        Constitutional: NAD, calm, comfortable Vitals:   07/07/18 0800 07/07/18 0830 07/07/18 0900 07/07/18 0930  BP: 107/60 110/61 109/67 112/73  Pulse: 75 67 69 72  Resp:      Temp:      TempSrc:      SpO2: 99% 99% 98% 100%  Weight:      Height:       Eyes: lids and conjunctivae normal ENMT: Mucous membranes are moist.  Neck: normal, supple Respiratory: clear to auscultation bilaterally. Normal respiratory effort. No accessory muscle use.  Cardiovascular: Regular rate and rhythm, no murmurs. No extremity edema. Abdomen: no tenderness, no distention. Bowel sounds positive.  Musculoskeletal: Left elbow antecubital fossa with swelling, tenderness, erythema and fluctuant abscess noted on the medial aspect of the epicondyle.  No drainage noted. Skin: no rashes, lesions, ulcers.  Psychiatric: Normal judgment and insight. Alert and oriented x 3. Normal mood.   Labs on Admission: I  have personally reviewed following labs and imaging studies  CBC: Recent Labs  Lab 07/05/18 2024 07/06/18 0535 07/07/18 0555  WBC 4.7 3.7* 5.8  NEUTROABS 4.1  --  4.2  HGB 14.3 13.5 13.0  HCT 44.6 42.4 39.4  MCV 85.4 87.1 85.1  PLT 179 156 194   Basic Metabolic Panel: Recent Labs  Lab 07/05/18 2024 07/06/18 0535 07/07/18 0555  NA 135 141 141  K 3.4* 3.9 3.4*  CL 100 110 106  CO2 GLUCOSE 125* 103* 103*  BUN 9 8 5*  CREATININE 0.91 0.74 0.70  CALCIUM 8.8* 8.2* 8.8*   GFR: Estimated Creatinine Clearance: 135.8 mL/min (by C-G formula based on SCr of 0.7  mg/dL). Liver Function Tests: Recent Labs  Lab 07/05/18 2024 07/07/18 0555  AST 16 16  ALT 13 16  ALKPHOS 78 68  BILITOT 0.3 0.4  PROT 7.1 6.5  ALBUMIN 3.9 3.6   No results for input(s): LIPASE, AMYLASE in the last 168 hours. No results for input(s): AMMONIA in the last 168 hours. Coagulation Profile: Recent Labs  Lab 07/05/18 2024  INR 1.1   Cardiac Enzymes: No results for input(s): CKTOTAL, CKMB, CKMBINDEX, TROPONINI in the last 168 hours. BNP (last 3 results) No results for input(s): PROBNP in the last 8760 hours. HbA1C: No results for input(s): HGBA1C in the last 72 hours. CBG: Recent Labs  Lab 07/06/18 0716  GLUCAP 125*   Lipid Profile: No results for input(s): CHOL, HDL, LDLCALC, TRIG, CHOLHDL, LDLDIRECT in the last 72 hours. Thyroid Function Tests: No results for input(s): TSH, T4TOTAL, FREET4, T3FREE, THYROIDAB in the last 72 hours. Anemia Panel: No results for input(s): VITAMINB12, FOLATE, FERRITIN, TIBC, IRON, RETICCTPCT in the last 72 hours. Urine analysis:    Component Value Date/Time   COLORURINE YELLOW 07/05/2018 2024   APPEARANCEUR CLEAR 07/05/2018 2024   LABSPEC 1.013 07/05/2018 2024   PHURINE 6.0 07/05/2018 2024   GLUCOSEU NEGATIVE 07/05/2018 2024   HGBUR NEGATIVE 07/05/2018 2024   BILIRUBINUR NEGATIVE 07/05/2018 2024   KETONESUR NEGATIVE 07/05/2018 2024   PROTEINUR NEGATIVE 07/05/2018 2024   NITRITE NEGATIVE 07/05/2018 2024   LEUKOCYTESUR NEGATIVE 07/05/2018 2024    Radiological Exams on Admission: Dg Chest 2 View  Result Date: 07/05/2018 CLINICAL DATA:  Sepsis EXAM: CHEST - 2 VIEW COMPARISON:  None. FINDINGS: The heart size and mediastinal contours are within normal limits. Both lungs are clear. The visualized skeletal structures are unremarkable. IMPRESSION: No active cardiopulmonary disease. Electronically Signed   By: Katherine Mantle M.D.   On: 07/05/2018 21:25   Dg Elbow Complete Left  Result Date: 07/07/2018 CLINICAL DATA:   Medial soft tissue swelling.  Rule out abscess. EXAM: LEFT ELBOW - COMPLETE 3+ VIEW COMPARISON:  None. FINDINGS: Normal alignment. No fracture or arthropathy. Joint spaces normal. Negative for osteomyelitis. No foreign body in the soft tissues. No gas in the soft tissues. Soft tissue swelling proximal medial forearm. IMPRESSION: Soft tissue swelling otherwise negative Electronically Signed   By: Marlan Palau M.D.   On: 07/07/2018 07:25    Assessment/Plan Principal Problem:   Abscess Active Problems:   Polysubstance abuse /Cocaine and Opiates   Tobacco abuse    Left upper extremity abscess/cellulitis -Continue on IV vancomycin as prescribed and follow blood cultures -Pain management with IV Dilaudid as needed -Continue n.p.o. except medications for now and consult Ortho upon arrival to [facility] for further evaluation -We will order MRI of left elbow as previously planned  Polysubstance abuse -Cocaine,  tobacco, and buprenorphine -Discussed cessation with patient and will offer nicotine patch  Mild hypokalemia -Replete orally and recheck in a.m. along with magnesium  Noncompliance with medical therapy with recent AMA discharge -Patient is high risk for leaving AMA and is generally high risk due to drug abuse history   DVT prophylaxis: Lovenox Code Status: Full Family Communication: None at bedside [facility] Plan:[facility] and MRI of elbow for further evaluation; No Ortho or MRI at [facility] this weekend; Continue Vanc Consults called:Spoke with Ortho at [facility], [facility] Admission status: Inpatient, MedSurg   Obie Kallenbach Hoover BrunetteD Artrice Kraker DO [facility] Pager [phone]  If 7PM-7AM, please contact night-coverage [email or web address] Password [email or web address]  07/07/2018, 9:57 AM

## 2018-07-07 NOTE — Progress Notes (Addendum)
Pharmacy Antibiotic Note  Timothy Christensen is a 23 y.o. male admitted on 07/07/2018 with cellulitis  of the left elbow. Pharmacy has been consulted for vancomycin dosing. Patient was previously on vancomycin 1.25g IV q12h, but only received one dose of that regiment due to leaving [facility] AMA on 5/22.  He also received one dose of anidulafungin before leaving AMA again today, 5/23.  Plan: Start vancomycin 1g IV q8h Predicted AUC of 460.1 mcg-h/mL  Predicted  Cmax~ 31.50mcg/mL Predicted Cmin~12.47mcg/mL Pharmacy will continue to monitor labs, cultures, vancomycin levels as appropriate and patient progress.  Height: 6' (182.9 cm) Weight: 150 lb (68 kg) IBW/kg (Calculated) : 77.6  Temp (24hrs), Avg:99.1 F (37.3 C), Min:99.1 F (37.3 C), Max:99.1 F (37.3 C)  Recent Labs  Lab 07/05/18 2024 07/05/18 2253 07/06/18 0535 07/07/18 0555 07/07/18 0813  WBC 4.7  --  3.7* 5.8  --   CREATININE 0.91  --  0.74 0.70  --   LATICACIDVEN 2.1* 1.1  --  1.6 1.3    Estimated Creatinine Clearance: 135.8 mL/min (by C-G formula based on SCr of 0.7 mg/dL).    No Known Allergies  Antimicrobials this admission: vancomycin 5/20 >>  anidulafungin 5/20 >>    Dose adjustments this admission: n/a  Microbiology results: 5/21 [facility] x2: 1 of 2 bottles= candida albicans  5/23 and 5/21 SARS-CoV-2 : negative    Thank you for allowing pharmacy to be a part of this patient's care.  Tama High 07/07/2018 2:12 PM

## 2018-07-07 NOTE — Progress Notes (Signed)
PHARMACY - PHYSICIAN COMMUNICATION CRITICAL VALUE ALERT - BLOOD CULTURE IDENTIFICATION (BCID)  Timothy Christensen is an 23 y.o. male who presented to [facility] on 07/07/2018 with a chief complaint of cellulitis.  Assessment: BC x2= 1/ 2= candida albicans Name of physician (or Provider) Contacted: Dr. Sherryll Burger  Current anti-microbials: vancomycin, anidulofungin  Changes to prescribed antibiotics recommended:  Patient is on recommended antibiotics - No changes needed  Results for orders placed or performed during the hospital encounter of 07/05/18  Blood Culture ID Panel (Reflexed) (Collected: 07/05/2018  8:24 PM)  Result Value Ref Range   Enterococcus species NOT DETECTED NOT DETECTED   Listeria monocytogenes NOT DETECTED NOT DETECTED   Staphylococcus species NOT DETECTED NOT DETECTED   Staphylococcus aureus (BCID) NOT DETECTED NOT DETECTED   Streptococcus species NOT DETECTED NOT DETECTED   Streptococcus agalactiae NOT DETECTED NOT DETECTED   Streptococcus pneumoniae NOT DETECTED NOT DETECTED   Streptococcus pyogenes NOT DETECTED NOT DETECTED   Acinetobacter baumannii NOT DETECTED NOT DETECTED   Enterobacteriaceae species NOT DETECTED NOT DETECTED   Enterobacter cloacae complex NOT DETECTED NOT DETECTED   Escherichia coli NOT DETECTED NOT DETECTED   Klebsiella oxytoca NOT DETECTED NOT DETECTED   Klebsiella pneumoniae NOT DETECTED NOT DETECTED   Proteus species NOT DETECTED NOT DETECTED   Serratia marcescens NOT DETECTED NOT DETECTED   Haemophilus influenzae NOT DETECTED NOT DETECTED   Neisseria meningitidis NOT DETECTED NOT DETECTED   Pseudomonas aeruginosa NOT DETECTED NOT DETECTED   Candida albicans DETECTED (A) NOT DETECTED   Candida glabrata NOT DETECTED NOT DETECTED   Candida krusei NOT DETECTED NOT DETECTED   Candida parapsilosis NOT DETECTED NOT DETECTED   Candida tropicalis NOT DETECTED NOT DETECTED    Tama High 07/07/2018  1:29 PM

## 2018-07-07 NOTE — Progress Notes (Signed)
ANTIBIOTIC CONSULT NOTE-Preliminary  Pharmacy Consult for vancomycin Indication: sepsis  No Known Allergies  Patient Measurements: Height: 6' (182.9 cm) Weight: 150 lb (68 kg) IBW/kg (Calculated) : 77.6 Adjusted Body Weight:   Vital Signs: Temp: 99.1 F (37.3 C) (05/23 0622) Temp Source: Oral (05/23 0622) BP: 131/91 (05/23 0622) Pulse Rate: 92 (05/23 0622)  Labs: Recent Labs    07/05/18 2024 07/06/18 0535 07/07/18 0555  WBC 4.7 3.7* 5.8  HGB 14.3 13.5 13.0  PLT 179 156 194  CREATININE 0.91 0.74  --     Estimated Creatinine Clearance: 135.8 mL/min (by C-G formula based on SCr of 0.74 mg/dL).  No results for input(s): VANCOTROUGH, VANCOPEAK, VANCORANDOM, GENTTROUGH, GENTPEAK, GENTRANDOM, TOBRATROUGH, TOBRAPEAK, TOBRARND, AMIKACINPEAK, AMIKACINTROU, AMIKACIN in the last 72 hours.   Microbiology: Recent Results (from the past 720 hour(s))  Culture, blood (Routine x 2)     Status: None (Preliminary result)   Collection Time: 07/05/18  8:24 PM  Result Value Ref Range Status   Specimen Description BLOOD  Final   Special Requests NONE  Final   Culture   Final    NO GROWTH < 12 HOURS Performed at [facility], [address] [place], [place] [address]    Report Status PENDING  Incomplete  Culture, blood (Routine x 2)     Status: None (Preliminary result)   Collection Time: 07/05/18  8:29 PM  Result Value Ref Range Status   Specimen Description BLOOD  Final   Special Requests NONE  Final   Culture   Final    NO GROWTH < 12 HOURS Performed at [facility], [address] [place], [place] [address]    Report Status PENDING  Incomplete  SARS Coronavirus 2 (CEPHEID - Performed in [facility] hospital lab), Hosp Order     Status: None   Collection Time: 07/05/18  9:01 PM  Result Value Ref Range Status   SARS Coronavirus 2 NEGATIVE NEGATIVE Final    Comment: (NOTE) If result is NEGATIVE SARS-CoV-2 target nucleic acids are NOT DETECTED. The SARS-CoV-2 RNA is generally  detectable in upper and lower  respiratory specimens during the acute phase of infection. The lowest  concentration of SARS-CoV-2 viral copies this assay can detect is 250  copies / mL. A negative result does not preclude SARS-CoV-2 infection  and should not be used as the sole basis for treatment or other  patient management decisions.  A negative result may occur with  improper specimen collection / handling, submission of specimen other  than nasopharyngeal swab, presence of viral mutation(s) within the  areas targeted by this assay, and inadequate number of viral copies  (<250 copies / mL). A negative result must be combined with clinical  observations, patient history, and epidemiological information. If result is POSITIVE SARS-CoV-2 target nucleic acids are DETECTED. The SARS-CoV-2 RNA is generally detectable in upper and lower  respiratory specimens dur ing the acute phase of infection.  Positive  results are indicative of active infection with SARS-CoV-2.  Clinical  correlation with patient history and other diagnostic information is  necessary to determine patient infection status.  Positive results do  not rule out bacterial infection or co-infection with other viruses. If result is PRESUMPTIVE POSTIVE SARS-CoV-2 nucleic acids MAY BE PRESENT.   A presumptive positive result was obtained on the submitted specimen  and confirmed on repeat testing.  While 2019 novel coronavirus  (SARS-CoV-2) nucleic acids may be present in the submitted sample  additional confirmatory testing may be necessary  for epidemiological  and / or clinical management purposes  to differentiate between  SARS-CoV-2 and other Sarbecovirus currently known to infect humans.  If clinically indicated additional testing with an alternate test  methodology [phone]) is advised. The SARS-CoV-2 RNA is generally  detectable in upper and lower respiratory sp ecimens during the acute  phase of infection. The  expected result is Negative. Fact Sheet for Patients:  BoilerBrush.com.cy Fact Sheet for Healthcare Providers: https://pope.com/ This test is not yet approved or cleared by the [place] FDA and has been authorized for detection and/or diagnosis of SARS-CoV-2 by FDA under an Emergency Use Authorization (EUA).  This EUA will remain in effect (meaning this test can be used) for the duration of the COVID-19 declaration under Section 564(b)(1) of the Act, 21 U.S.C. section 360bbb-3(b)(1), unless the authorization is terminated or revoked sooner. Performed at [facility], [address], [place], [place] [address]     Medical History: History reviewed. No pertinent past medical history.  Medications:  Anti-infectives (From admission, onward)   Start     Dose/Rate Route Frequency Ordered Stop   07/07/18 0645  vancomycin (VANCOCIN) 1,250 mg in sodium chloride 0.9 % 250 mL IVPB     1,250 mg 166.7 mL/hr over 90 Minutes Intravenous  Once 07/07/18 0641        Assessment: 23 yo with abscess on left hand.  Pharmacy has been consulted for vancomycin dosing.    Goal of Therapy:  Vancomycin trough level 15-20 mcg/ml  Plan:  Preliminary review of pertinent patient information completed.  Protocol will be initiated with dose(s) of vancomycin 1250mg  IV.  Jeani Hawking clinical pharmacist will complete review during morning rounds to assess patient and finalize treatment regimen if needed.  Madelaine Whipple Scarlett, RPH 07/07/2018,6:45 AM

## 2018-07-08 ENCOUNTER — Encounter (HOSPITAL_COMMUNITY)
Admission: EM | Disposition: A | Discharge status: Home / Self Care | Payer: Self-pay | Source: Home / Self Care | Attending: Internal Medicine

## 2018-07-08 ENCOUNTER — Encounter (HOSPITAL_COMMUNITY): Payer: Self-pay | Admitting: Radiology

## 2018-07-08 ENCOUNTER — Inpatient Hospital Stay (HOSPITAL_COMMUNITY): Payer: Self-pay | Admitting: Anesthesiology

## 2018-07-08 ENCOUNTER — Inpatient Hospital Stay (HOSPITAL_COMMUNITY): Payer: Self-pay

## 2018-07-08 DIAGNOSIS — F419 Anxiety disorder, unspecified: Secondary | ICD-10-CM

## 2018-07-08 DIAGNOSIS — A419 Sepsis, unspecified organism: Principal | ICD-10-CM

## 2018-07-08 DIAGNOSIS — R451 Restlessness and agitation: Secondary | ICD-10-CM

## 2018-07-08 DIAGNOSIS — F1990 Other psychoactive substance use, unspecified, uncomplicated: Secondary | ICD-10-CM

## 2018-07-08 DIAGNOSIS — F1410 Cocaine abuse, uncomplicated: Secondary | ICD-10-CM

## 2018-07-08 DIAGNOSIS — Z9119 Patient's noncompliance with other medical treatment and regimen: Secondary | ICD-10-CM

## 2018-07-08 DIAGNOSIS — L03119 Cellulitis of unspecified part of limb: Secondary | ICD-10-CM

## 2018-07-08 DIAGNOSIS — B49 Unspecified mycosis: Secondary | ICD-10-CM

## 2018-07-08 DIAGNOSIS — L02419 Cutaneous abscess of limb, unspecified: Secondary | ICD-10-CM

## 2018-07-08 DIAGNOSIS — F17210 Nicotine dependence, cigarettes, uncomplicated: Secondary | ICD-10-CM

## 2018-07-08 DIAGNOSIS — R4587 Impulsiveness: Secondary | ICD-10-CM

## 2018-07-08 DIAGNOSIS — L02414 Cutaneous abscess of left upper limb: Secondary | ICD-10-CM

## 2018-07-08 HISTORY — PX: I & D EXTREMITY: SHX5045

## 2018-07-08 LAB — CBC
HCT: 36.4 % — ABNORMAL LOW (ref 39.0–52.0)
Hemoglobin: 11.7 g/dL — ABNORMAL LOW (ref 13.0–17.0)
MCH: 27.5 pg (ref 26.0–34.0)
MCHC: 32.1 g/dL (ref 30.0–36.0)
MCV: 85.4 fL (ref 80.0–100.0)
Platelets: 184 10*3/uL (ref 150–400)
RBC: 4.26 MIL/uL (ref 4.22–5.81)
RDW: 15.5 % (ref 11.5–15.5)
WBC: 5.7 10*3/uL (ref 4.0–10.5)
nRBC: 0 % (ref 0.0–0.2)

## 2018-07-08 LAB — BASIC METABOLIC PANEL
Anion gap: 6 (ref 5–15)
BUN: 7 mg/dL (ref 6–20)
CO2: 24 mmol/L (ref 22–32)
Calcium: 8.3 mg/dL — ABNORMAL LOW (ref 8.9–10.3)
Chloride: 110 mmol/L (ref 98–111)
Creatinine, Ser: 0.8 mg/dL (ref 0.61–1.24)
GFR calc Af Amer: >60 mL/min (ref >60)
GFR calc non Af Amer: >60 mL/min (ref >60)
Glucose, Bld: 117 mg/dL — ABNORMAL HIGH (ref 70–99)
Potassium: 3.8 mmol/L (ref 3.5–5.1)
Sodium: 140 mmol/L (ref 135–145)

## 2018-07-08 SURGERY — IRRIGATION AND DEBRIDEMENT EXTREMITY
Anesthesia: General | Site: Arm Upper | Laterality: Left

## 2018-07-08 MED ORDER — ACETAMINOPHEN 160 MG/5ML PO SOLN: 1000.0000 mg | Freq: Once | ORAL | Status: DC | PRN

## 2018-07-08 MED ORDER — TOBRAMYCIN SULFATE 1.2 G IJ SOLR
INTRAMUSCULAR | Status: AC
  Filled 2018-07-08: qty 1.2

## 2018-07-08 MED ORDER — FLUCONAZOLE IN SODIUM CHLORIDE 400-0.9 MG/200ML-% IV SOLN
400.0000 mg | Freq: Once | INTRAVENOUS | Status: AC
  Administered 2018-07-08: 400 mg via INTRAVENOUS
  Filled 2018-07-08: qty 200

## 2018-07-08 MED ORDER — HYDROMORPHONE HCL 1 MG/ML IJ SOLN
INTRAMUSCULAR | Status: AC
  Filled 2018-07-08: qty 1

## 2018-07-08 MED ORDER — OXYCODONE HCL 5 MG/5ML PO SOLN: 5.0000 mg | Freq: Once | ORAL | Status: AC | PRN

## 2018-07-08 MED ORDER — MIDAZOLAM HCL 2 MG/2ML IJ SOLN
INTRAMUSCULAR | Status: AC
  Filled 2018-07-08: qty 2

## 2018-07-08 MED ORDER — PROPOFOL 10 MG/ML IV BOLUS
INTRAVENOUS | Status: DC | PRN
  Administered 2018-07-08: 200 mg via INTRAVENOUS

## 2018-07-08 MED ORDER — MIDAZOLAM HCL 5 MG/5ML IJ SOLN
INTRAMUSCULAR | Status: DC | PRN
  Administered 2018-07-08 (×2): 1 mg via INTRAVENOUS

## 2018-07-08 MED ORDER — HYDROMORPHONE HCL 1 MG/ML IJ SOLN
1.0000 mg | INTRAMUSCULAR | Status: DC | PRN
  Administered 2018-07-08 (×2): 1 mg via INTRAVENOUS
  Filled 2018-07-08 (×3): qty 1

## 2018-07-08 MED ORDER — ONDANSETRON HCL 4 MG/2ML IJ SOLN
INTRAMUSCULAR | Status: AC
  Filled 2018-07-08: qty 2

## 2018-07-08 MED ORDER — CHLORHEXIDINE GLUCONATE 4 % EX LIQD: 60.0000 mL | Freq: Once | CUTANEOUS | Status: DC

## 2018-07-08 MED ORDER — FENTANYL CITRATE (PF) 100 MCG/2ML IJ SOLN
INTRAMUSCULAR | Status: DC | PRN
  Administered 2018-07-08 (×2): 50 ug via INTRAVENOUS
  Administered 2018-07-08: 150 ug via INTRAVENOUS

## 2018-07-08 MED ORDER — HYDROMORPHONE HCL 1 MG/ML IJ SOLN
1.0000 mg | INTRAMUSCULAR | Status: DC | PRN
  Administered 2018-07-09 (×3): 1 mg via INTRAVENOUS
  Filled 2018-07-08 (×3): qty 1

## 2018-07-08 MED ORDER — ACETAMINOPHEN 500 MG PO TABS: 1000.0000 mg | ORAL_TABLET | Freq: Once | ORAL | Status: DC | PRN

## 2018-07-08 MED ORDER — HYDROMORPHONE HCL 1 MG/ML IJ SOLN
INTRAMUSCULAR | Status: DC | PRN
  Administered 2018-07-08 (×2): 0.5 mg via INTRAVENOUS

## 2018-07-08 MED ORDER — DEXAMETHASONE SODIUM PHOSPHATE 10 MG/ML IJ SOLN
INTRAMUSCULAR | Status: DC | PRN
  Administered 2018-07-08: 5 mg via INTRAVENOUS

## 2018-07-08 MED ORDER — ROCURONIUM BROMIDE 10 MG/ML (PF) SYRINGE
PREFILLED_SYRINGE | INTRAVENOUS | Status: AC
  Filled 2018-07-08: qty 10

## 2018-07-08 MED ORDER — DEXMEDETOMIDINE HCL IN NACL 400 MCG/100ML IV SOLN
INTRAVENOUS | Status: DC | PRN
  Administered 2018-07-08 (×2): 8 ug via INTRAVENOUS
  Administered 2018-07-08 (×2): 4 ug via INTRAVENOUS

## 2018-07-08 MED ORDER — LIDOCAINE 2% (20 MG/ML) 5 ML SYRINGE
INTRAMUSCULAR | Status: DC | PRN
  Administered 2018-07-08: 60 mg via INTRAVENOUS

## 2018-07-08 MED ORDER — SODIUM CHLORIDE 0.9 % IR SOLN
Status: DC | PRN
  Administered 2018-07-08: 3000 mL

## 2018-07-08 MED ORDER — VANCOMYCIN HCL 1000 MG IV SOLR
INTRAVENOUS | Status: DC | PRN
  Administered 2018-07-08: 1000 mg via TOPICAL

## 2018-07-08 MED ORDER — FLUCONAZOLE IN SODIUM CHLORIDE 400-0.9 MG/200ML-% IV SOLN: 800.0000 mg | Freq: Once | INTRAVENOUS | Status: DC

## 2018-07-08 MED ORDER — SUGAMMADEX SODIUM 200 MG/2ML IV SOLN
INTRAVENOUS | Status: DC | PRN
  Administered 2018-07-08: 250 mg via INTRAVENOUS

## 2018-07-08 MED ORDER — SUCCINYLCHOLINE CHLORIDE 200 MG/10ML IV SOSY
PREFILLED_SYRINGE | INTRAVENOUS | Status: AC
  Filled 2018-07-08: qty 10

## 2018-07-08 MED ORDER — TOBRAMYCIN SULFATE 1.2 G IJ SOLR
INTRAMUSCULAR | Status: DC | PRN
  Administered 2018-07-08: 1.2 g

## 2018-07-08 MED ORDER — FLUCONAZOLE IN SODIUM CHLORIDE 400-0.9 MG/200ML-% IV SOLN
400.0000 mg | INTRAVENOUS | Status: DC
  Filled 2018-07-08: qty 200

## 2018-07-08 MED ORDER — SODIUM CHLORIDE 0.9 % IR SOLN
Status: DC | PRN
  Administered 2018-07-08: 1000 mL

## 2018-07-08 MED ORDER — ACETAMINOPHEN 10 MG/ML IV SOLN: 1000.0000 mg | Freq: Once | INTRAVENOUS | Status: DC | PRN

## 2018-07-08 MED ORDER — IOHEXOL 300 MG/ML  SOLN
75.0000 mL | Freq: Once | INTRAMUSCULAR | Status: AC | PRN
  Administered 2018-07-08: 10:00:00 100 mL via INTRAVENOUS

## 2018-07-08 MED ORDER — CEFAZOLIN SODIUM-DEXTROSE 2-4 GM/100ML-% IV SOLN
2.0000 g | INTRAVENOUS | Status: DC
  Filled 2018-07-08: qty 100

## 2018-07-08 MED ORDER — OXYCODONE HCL 5 MG PO TABS
5.0000 mg | ORAL_TABLET | Freq: Once | ORAL | Status: AC | PRN
  Administered 2018-07-08: 5 mg via ORAL

## 2018-07-08 MED ORDER — SUCCINYLCHOLINE CHLORIDE 200 MG/10ML IV SOSY
PREFILLED_SYRINGE | INTRAVENOUS | Status: DC | PRN
  Administered 2018-07-08: 80 mg via INTRAVENOUS

## 2018-07-08 MED ORDER — DEXAMETHASONE SODIUM PHOSPHATE 10 MG/ML IJ SOLN
INTRAMUSCULAR | Status: AC
  Filled 2018-07-08: qty 1

## 2018-07-08 MED ORDER — FENTANYL CITRATE (PF) 250 MCG/5ML IJ SOLN
INTRAMUSCULAR | Status: AC
  Filled 2018-07-08: qty 5

## 2018-07-08 MED ORDER — ROCURONIUM BROMIDE 100 MG/10ML IV SOLN
INTRAVENOUS | Status: DC | PRN
  Administered 2018-07-08: 50 mg via INTRAVENOUS

## 2018-07-08 MED ORDER — NICOTINE 14 MG/24HR TD PT24
14.0000 mg | MEDICATED_PATCH | Freq: Every day | TRANSDERMAL | Status: DC
  Administered 2018-07-08 – 2018-07-09 (×3): 14 mg via TRANSDERMAL
  Filled 2018-07-08 (×3): qty 1

## 2018-07-08 MED ORDER — FLUCONAZOLE IN SODIUM CHLORIDE 400-0.9 MG/200ML-% IV SOLN
800.0000 mg | Freq: Once | INTRAVENOUS | Status: DC
  Filled 2018-07-08: qty 400

## 2018-07-08 MED ORDER — LACTATED RINGERS IV SOLN
INTRAVENOUS | Status: DC | PRN
  Administered 2018-07-08: 15:00:00 via INTRAVENOUS

## 2018-07-08 MED ORDER — ONDANSETRON HCL 4 MG/2ML IJ SOLN
INTRAMUSCULAR | Status: DC | PRN
  Administered 2018-07-08: 4 mg via INTRAVENOUS

## 2018-07-08 MED ORDER — OXYCODONE HCL 5 MG PO TABS
ORAL_TABLET | ORAL | Status: AC
  Filled 2018-07-08: qty 1

## 2018-07-08 MED ORDER — KETOROLAC TROMETHAMINE 30 MG/ML IJ SOLN
30.0000 mg | Freq: Four times a day (QID) | INTRAMUSCULAR | Status: DC | PRN
  Administered 2018-07-08 – 2018-07-09 (×2): 30 mg via INTRAVENOUS
  Filled 2018-07-08 (×2): qty 1

## 2018-07-08 MED ORDER — LIDOCAINE 2% (20 MG/ML) 5 ML SYRINGE
INTRAMUSCULAR | Status: AC
  Filled 2018-07-08: qty 5

## 2018-07-08 MED ORDER — VANCOMYCIN HCL 1000 MG IV SOLR
INTRAVENOUS | Status: AC
  Filled 2018-07-08: qty 1000

## 2018-07-08 MED ORDER — HYDROMORPHONE HCL 1 MG/ML IJ SOLN
0.2500 mg | INTRAMUSCULAR | Status: AC | PRN
  Administered 2018-07-08 (×8): 0.5 mg via INTRAVENOUS

## 2018-07-08 MED ORDER — PROPOFOL 10 MG/ML IV BOLUS
INTRAVENOUS | Status: AC
  Filled 2018-07-08: qty 20

## 2018-07-08 SURGICAL SUPPLY — 43 items
BANDAGE ACE 4X5 VEL STRL LF (GAUZE/BANDAGES/DRESSINGS) ×3 IMPLANT
BANDAGE ACE 6X5 VEL STRL LF (GAUZE/BANDAGES/DRESSINGS) ×3 IMPLANT
BNDG GAUZE ELAST 4 BULKY (GAUZE/BANDAGES/DRESSINGS) ×3 IMPLANT
CONT SPEC 4OZ CLIKSEAL STRL BL (MISCELLANEOUS) IMPLANT
COVER SURGICAL LIGHT HANDLE (MISCELLANEOUS) ×3 IMPLANT
COVER WAND RF STERILE (DRAPES) ×3 IMPLANT
CUFF TOURN SGL LL 12 NO SLV (MISCELLANEOUS) IMPLANT
CUFF TOURNIQUET SINGLE 34IN LL (TOURNIQUET CUFF) IMPLANT
DRAPE U-SHAPE 47X51 STRL (DRAPES) IMPLANT
DRSG PAD ABDOMINAL 8X10 ST (GAUZE/BANDAGES/DRESSINGS) ×3 IMPLANT
DURAPREP 26ML APPLICATOR (WOUND CARE) ×3 IMPLANT
ELECT REM PT RETURN 9FT ADLT (ELECTROSURGICAL)
ELECTRODE REM PT RTRN 9FT ADLT (ELECTROSURGICAL) IMPLANT
GAUZE SPONGE 4X4 12PLY STRL (GAUZE/BANDAGES/DRESSINGS) ×3 IMPLANT
GAUZE XEROFORM 1X8 LF (GAUZE/BANDAGES/DRESSINGS) ×3 IMPLANT
GLOVE BIOGEL PI IND STRL 8 (GLOVE) ×1 IMPLANT
GLOVE BIOGEL PI INDICATOR 8 (GLOVE) ×2
GLOVE ECLIPSE 8.0 STRL XLNG CF (GLOVE) ×6 IMPLANT
GOWN STRL REUS W/ TWL LRG LVL3 (GOWN DISPOSABLE) ×1 IMPLANT
GOWN STRL REUS W/ TWL XL LVL3 (GOWN DISPOSABLE) ×2 IMPLANT
GOWN STRL REUS W/TWL LRG LVL3 (GOWN DISPOSABLE) ×2
GOWN STRL REUS W/TWL XL LVL3 (GOWN DISPOSABLE) ×4
HANDPIECE INTERPULSE COAX TIP (DISPOSABLE)
KIT BASIN OR (CUSTOM PROCEDURE TRAY) ×3 IMPLANT
KIT TURNOVER KIT B (KITS) ×3 IMPLANT
MANIFOLD NEPTUNE II (INSTRUMENTS) ×3 IMPLANT
NS IRRIG 1000ML POUR BTL (IV SOLUTION) ×3 IMPLANT
PACK ORTHO EXTREMITY (CUSTOM PROCEDURE TRAY) ×3 IMPLANT
PAD ABD 7.5X8 STRL (GAUZE/BANDAGES/DRESSINGS) ×3 IMPLANT
PAD ARMBOARD 7.5X6 YLW CONV (MISCELLANEOUS) ×6 IMPLANT
PAD CAST 4YDX4 CTTN HI CHSV (CAST SUPPLIES) ×1 IMPLANT
PADDING CAST COTTON 4X4 STRL (CAST SUPPLIES) ×2
PADDING CAST COTTON 6X4 STRL (CAST SUPPLIES) ×3 IMPLANT
SET HNDPC FAN SPRY TIP SCT (DISPOSABLE) IMPLANT
SPONGE LAP 18X18 RF (DISPOSABLE) IMPLANT
SUT ETHILON 2 0 FS 18 (SUTURE) ×3 IMPLANT
SWAB CULTURE ESWAB REG 1ML (MISCELLANEOUS) IMPLANT
TOWEL OR 17X24 6PK STRL BLUE (TOWEL DISPOSABLE) ×3 IMPLANT
TOWEL OR 17X26 10 PK STRL BLUE (TOWEL DISPOSABLE) ×3 IMPLANT
TUBE CONNECTING 12'X1/4 (SUCTIONS) ×1
TUBE CONNECTING 12X1/4 (SUCTIONS) ×2 IMPLANT
UNDERPAD 30X30 (UNDERPADS AND DIAPERS) ×3 IMPLANT
YANKAUER SUCT BULB TIP NO VENT (SUCTIONS) ×3 IMPLANT

## 2018-07-08 NOTE — Progress Notes (Signed)
Patient c/o increase pain after his surgery. On call provider notified no new orders. Patient informed.

## 2018-07-08 NOTE — Consult Note (Signed)
ORTHOPAEDIC CONSULTATION  REQUESTING PHYSICIAN: Pokhrel, Laxman, MD  Chief Complaint: Left arm abscess  HPI: Fedrick Cefalu is a 23 y.o. male with history of IV drug abuse who has signed out multiple times [place] with the same problem who is had swelling in his left arm for 1 week.  He was seen in any pending signed out [place] at that point.  He was admitted again for pain.  Orthopedics consulted for an abscess in this arm.  History reviewed. No pertinent past medical history. Past Surgical History:  Procedure Laterality Date  . WRIST SURGERY     Social History   Socioeconomic History  . Marital status: Single    Spouse name: Not on file  . Number of children: Not on file  . Years of education: Not on file  . Highest education level: Not on file  Occupational History  . Not on file  Social Needs  . Financial resource strain: Patient refused  . Food insecurity:    Worry: Patient refused    Inability: Patient refused  . Transportation needs:    Medical: Patient refused    Non-medical: Patient refused  Tobacco Use  . Smoking status: Current Every Day Smoker    Packs/day: 0.50    Types: Cigarettes  . Smokeless tobacco: Never Used  Substance and Sexual Activity  . Alcohol use: No  . Drug use: Yes    Types: Marijuana  . Sexual activity: Yes    Birth control/protection: Condom  Lifestyle  . Physical activity:    Days per week: Patient refused    Minutes per session: Patient refused  . Stress: Patient refused  Relationships  . Social connections:    Talks on phone: Patient refused    Gets together: Patient refused    Attends religious service: Patient refused    Active member of club or organization: Patient refused    Attends meetings of clubs or organizations: Patient refused    Relationship status: Patient refused  Other Topics Concern  . Not on file  Social History Narrative  . Not on file   History reviewed. No pertinent  family history. No Known Allergies Prior to Admission medications   Medication Sig Start Date End Date Taking? Authorizing Provider  doxycycline (VIBRAMYCIN) 100 MG capsule Take 1 capsule (100 mg total) by mouth 2 (two) times daily. 07/06/18  Deveron Furlong, MD   Dg Elbow Complete Left  Result Date: 07/07/2018 CLINICAL DATA:  Medial soft tissue swelling.  Rule out abscess. EXAM: LEFT ELBOW - COMPLETE 3+ VIEW COMPARISON:  None. FINDINGS: Normal alignment. No fracture or arthropathy. Joint spaces normal. Negative for osteomyelitis. No foreign body in the soft tissues. No gas in the soft tissues. Soft tissue swelling proximal medial forearm. IMPRESSION: Soft tissue swelling otherwise negative Electronically Signed   By: Franchot Gallo M.D.   On: 07/07/2018 07:25   Ct Elbow Left W Contrast  Result Date: 07/08/2018 CLINICAL DATA:  Abscess in the antecubital fossa. Intravenous drug abuse. EXAM: CT OF THE UPPER LEFT EXTREMITY WITH CONTRAST TECHNIQUE: Multidetector CT imaging of the left elbow and proximal forearm was performed according to the standard protocol following intravenous contrast administration. COMPARISON:  Elbow radiographs 07/07/2018. CONTRAST:  194m OMNIPAQUE IOHEXOL 300 MG/ML  SOLN FINDINGS: Bones/Joint/Cartilage No evidence of acute fracture, dislocation or significant arthropathy. There is no bone destruction or elbow joint effusion. Ligaments Suboptimally assessed by CT. Muscles and Tendons No intramuscular fluid collections are identified. Superficial  abscess in the antecubital fossa is located just medial to the musculotendinous junction of the biceps tendon. The biceps tendon appears normal. Soft tissues Superficial, peripherally enhancing fluid collection medially in the antecubital fossa measures 4.1 x 3.1 x 3.5 cm and is consistent with an abscess. There is no foreign body. This fluid collection is associated with extrinsic narrowing of the basilic vein which may indicate superficial  thrombophlebitis. IMPRESSION: 1. Superficial, peripherally enhancing fluid collection antecubital fossa consistent with an abscess. 2. Possible associated superficial thrombophlebitis of the basilic vein. 3. No evidence of septic joint or osteomyelitis. Electronically Signed   By: Richardean Sale M.D.   On: 07/08/2018 11:55   Family History Reviewed and non-contributory, no pertinent history of problems with bleeding or anesthesia      Review of Systems 14 system ROS conducted and negative except for that noted in HPI   OBJECTIVE  Vitals: Patient Vitals for the past 8 hrs:  BP Temp Temp src Pulse Resp SpO2  07/08/18 1246 123/75 98.1 F (36.7 C) Oral 81 16 98 %   General: Alert, no acute distress Cardiovascular: Warm extremities noted Respiratory: No cyanosis, no use of accessory musculature GI: No organomegaly, abdomen is soft and non-tender Skin: No lesions in the area of chief complaint other than those listed below in MSK exam.  Neurologic: Sensation intact distally save for the below mentioned MSK exam Psychiatric: Patient is competent for consent with normal mood and affect Lymphatic: No swelling obvious and reported other than the area involved in the exam below Extremities   LUE: Left arm with large abscess in the lower humerus, neurovascular intact distally, warm well perfused arm.  Multiple track marks.     Test Results Imaging CT demonstrates a superficial abscess in the distal humerus and antecubital fossa.  Labs cbc Recent Labs    07/07/18 1721 07/08/18 0703  WBC 5.0 5.7  HGB 13.3 11.7*  HCT 41.2 36.4*  PLT 189 184    Labs inflam No results for input(s): CRP in the last 72 hours.  Invalid input(s): ESR  Labs coag Recent Labs    07/05/18 2024  INR 1.1    Recent Labs    07/07/18 1721 07/08/18 0703  NA 136 140  K 3.9 3.8  CL 106 110  CO2 23 24  GLUCOSE 110* 117*  BUN <5* 7  CREATININE 0.82 0.80  CALCIUM 8.7* 8.3*     ASSESSMENT AND  PLAN: 23 y.o. male with the following: Left arm abscess  Patient requires incision and debridement with washout and likely packing.  Discussed the risk benefits alternatives the patient at length.  All questions were answered.  Patient was relatively verbally combative during our discussion.  I discussed at length that we were here to help him.  His decision to proceed with surgery is up to him because it is our medical advice that he needs his abscess drained.  He is considering leaving AMA.  If he leaves AMA no follow-up will be scheduled.

## 2018-07-08 NOTE — Transfer of Care (Signed)
Immediate Anesthesia Transfer of Care Note  Patient: Timothy Christensen  Procedure(s) Performed: IRRIGATION AND DEBRIDEMENT HUMERUS (Left Arm Upper)  Patient Location: PACU  Anesthesia Type:General  Level of Consciousness: awake and alert   Airway & Oxygen Therapy: Patient Spontanous Breathing  Post-op Assessment: Report given to RN, Post -op Vital signs reviewed and stable and Patient moving all extremities  Post vital signs: Reviewed and stable  Last Vitals:  Vitals Value Taken Time  BP 140/87 07/08/2018  4:56 PM  Temp 37 C 07/08/2018  4:56 PM  Pulse 92 07/08/2018  5:07 PM  Resp 14 07/08/2018  5:07 PM  SpO2 99 % 07/08/2018  5:07 PM  Vitals shown include unvalidated device data.  Last Pain:  Vitals:   07/08/18 1656  TempSrc:   PainSc: 10-Worst pain ever      Patients Stated Pain Goal: 0 (07/08/18 1230)  Complications: No apparent anesthesia complications

## 2018-07-08 NOTE — Anesthesia Preprocedure Evaluation (Signed)
Anesthesia Evaluation  Patient identified by MRN, date of birth, ID band Patient awake    Reviewed: Allergy & Precautions, NPO status , Patient's Chart, lab work & pertinent test results  History of Anesthesia Complications Negative for: history of anesthetic complications  Airway Mallampati: II  TM Distance: >3 FB Neck ROM: Full    Dental  (+) Dental Advisory Given   Pulmonary neg recent URI, Current Smoker,    breath sounds clear to auscultation       Cardiovascular negative cardio ROS   Rhythm:Regular     Neuro/Psych negative neurological ROS  negative psych ROS   GI/Hepatic negative GI ROS, (+)     substance abuse  ,   Endo/Other  negative endocrine ROS  Renal/GU negative Renal ROS     Musculoskeletal infection of humerus   Abdominal   Peds  Hematology negative hematology ROS (+)   Anesthesia Other Findings   Reproductive/Obstetrics                             Anesthesia Physical Anesthesia Plan  ASA: III  Anesthesia Plan: General   Post-op Pain Management:    Induction: Intravenous, Rapid sequence and Cricoid pressure planned  PONV Risk Score and Plan: 1 and Ondansetron and Dexamethasone  Airway Management Planned: Oral ETT  Additional Equipment: None  Intra-op Plan:   Post-operative Plan: Extubation in OR  Informed Consent: I have reviewed the patients History and Physical, chart, labs and discussed the procedure including the risks, benefits and alternatives for the proposed anesthesia with the patient or authorized representative who has indicated his/her understanding and acceptance.     Dental advisory given  Plan Discussed with: CRNA and Surgeon  Anesthesia Plan Comments:         Anesthesia Quick Evaluation

## 2018-07-08 NOTE — Progress Notes (Signed)
Patient went down for surgery Left arm washout of abscess at 1400.  Report given to OR and short stay.  IV fluids held while in surgery, consent to be signed by surgery downstairs. Two CHG baths completed per protocol.

## 2018-07-08 NOTE — Plan of Care (Signed)
  Problem: Education: Goal: Knowledge of General Education information will improve Description Including pain rating scale, medication(s)/side effects and non-pharmacologic comfort measures Outcome: Progressing   

## 2018-07-08 NOTE — Consult Note (Signed)
Date of Admission:  07/07/2018          Reason for Consult: Candida albicans fungemia    Referring Provider: Connye Burkitt auto consult and. Dr. Tyson Babinski   Assessment:  1. Candida albicans fungemia due to IVDU with  2. Soft tissue abscess at site of injection   Plan:  1. I will change him over to fluconazole given that it is a Candida albicans species 2. Hopefully he will consent to orthopedic surgery but I am not encouraged by the way he is being belligerent and constantly threatening to leave AGAINST MEDICAL ADVICE 3. If he goes to the operating room I would make sure that cultures are sent in sterile container to microbiology lab but not to pathology 4. Ideally he would need a formal ophthalmologic exam to rule out fungal endophthalmitis 5. I would not give him vancomycin since we have no evidence of a specific staphylococcal infection.  Principal Problem:   Cellulitis and abscess of upper extremity Active Problems:   IVDU (intravenous drug user)/Opiates/Cocaine   Noncompliance   Cocaine abuse (HCC)   Sepsis (HCC)   Scheduled Meds: . chlorhexidine  60 mL Topical Once  . enoxaparin (LOVENOX) injection  40 mg Subcutaneous Q24H  . nicotine  14 mg Transdermal Daily   Continuous Infusions: . sodium chloride 100 mL/hr at 07/08/18 0400  . [START ON 07/09/2018]  ceFAZolin (ANCEF) IV    . [START ON 07/09/2018] fluconazole (DIFLUCAN) IV    . fluconazole (DIFLUCAN) IV     PRN Meds:.acetaminophen **OR** acetaminophen, HYDROmorphone (DILAUDID) injection, ketorolac, ondansetron **OR** ondansetron (ZOFRAN) IV, oxyCODONE  HPI: Timothy Christensen is a 23 y.o. male with history of IV drug use that he minimizes but which is quite clearly fairly extensive.  He was admitted to [facility] on 21 May with worsening fever tachycardia and an area of abscess in his antecubital fossa.  Blood cultures were obtained in the interim grew Candida albicans.  I started him on Eraxis while he was at any pen.   Arrangements were being made for him to be transferred to [facility] but instead he left AGAINST MEDICAL ADVICE and then showed up at the [facility] emergency department.  He is subsequent been admitted to the hospitalist service and started back on Eraxis and vancomycin.  I discontinue the vancomycin.  CT scan of the elbow shows a soft tissue abscess but fortunately no joint involvement.  He denies any problems with vision at time.  He has been formally seen by orthopedic surgery who would like to take him to the operating room.  He is however again threatened to leave AGAINST MEDICAL ADVICE.  I will check HIV and hep C test on him as well as well as inflammatory markers.  If he would cooperate and allow surgery taken the operating room it would be great to obtain intraoperative cultures and send them in a sterile container to the microbiology lab but not to pathology lab.  If something else grows besides the Candida we can certainly target that as well.  I would like to plan on giving him fluconazole to allow him to complete 2 weeks of therapy.  He does not have insurance however we would need to make sure that we can help him through an assistance program with our pharmacy and give him the medications prior to discharge.  This will be complicated if he keeps insisting on leaving AGAINST MEDICAL ADVICE.  My partner Dr. Orvan Falconer will be here tomorrow if the  patient is still here   Review of Systems: Review of Systems  Constitutional: Positive for fever. Negative for chills, diaphoresis, malaise/fatigue and weight loss.  HENT: Negative for congestion, hearing loss, sore throat and tinnitus.   Eyes: Negative for blurred vision and double vision.  Respiratory: Negative for cough, sputum production, shortness of breath and wheezing.   Cardiovascular: Negative for chest pain, palpitations and leg swelling.  Gastrointestinal: Negative for abdominal pain, blood in stool, constipation, diarrhea,  heartburn, melena, nausea and vomiting.  Genitourinary: Negative for dysuria, flank pain and hematuria.  Musculoskeletal: Positive for joint pain and myalgias. Negative for back pain and falls.  Skin: Negative for itching and rash.  Neurological: Negative for dizziness, sensory change, focal weakness, loss of consciousness, weakness and headaches.  Endo/Heme/Allergies: Does not bruise/bleed easily.  Psychiatric/Behavioral: Positive for substance abuse. Negative for depression, memory loss and suicidal ideas. The patient is nervous/anxious.     History reviewed. No pertinent past medical history.  Social History   Tobacco Use  . Smoking status: Current Every Day Smoker    Packs/day: 0.50    Types: Cigarettes  . Smokeless tobacco: Never Used  Substance Use Topics  . Alcohol use: No  . Drug use: Yes    Types: Marijuana    History reviewed. No pertinent family history. No Known Allergies  OBJECTIVE: Blood pressure 123/75, pulse 81, temperature 98.1 F (36.7 C), temperature source Oral, resp. rate 16, height 6' (1.829 m), weight 68.8 kg, SpO2 98 %.  Physical Exam Constitutional:      General: He is not in acute distress.    Appearance: He is not diaphoretic.  HENT:     Head: Normocephalic and atraumatic.     Right Ear: External ear normal.     Left Ear: External ear normal.     Nose: Nose normal.     Mouth/Throat:     Pharynx: No oropharyngeal exudate.  Eyes:     General: No scleral icterus.    Extraocular Movements: Extraocular movements intact.     Conjunctiva/sclera: Conjunctivae normal.     Pupils: Pupils are equal, round, and reactive to light.  Neck:     Musculoskeletal: Normal range of motion and neck supple.  Cardiovascular:     Rate and Rhythm: Regular rhythm. Tachycardia present.     Heart sounds: Normal heart sounds. No murmur. No friction rub. No gallop.   Pulmonary:     Effort: Pulmonary effort is normal. No respiratory distress.     Breath sounds: Normal  breath sounds. No wheezing or rales.  Abdominal:     General: Bowel sounds are normal. There is no distension.     Palpations: Abdomen is soft.     Tenderness: There is no abdominal tenderness. There is no rebound.  Musculoskeletal:        General: No tenderness.     Left elbow: He exhibits decreased range of motion.  Lymphadenopathy:     Cervical: No cervical adenopathy.  Skin:    General: Skin is warm and dry.     Coloration: Skin is not pale.     Findings: No erythema or rash.  Neurological:     General: No focal deficit present.     Mental Status: He is oriented to person, place, and time.     Coordination: Coordination normal.  Psychiatric:        Attention and Perception: Attention normal.        Mood and Affect: Mood is anxious. Affect is labile  and blunt.        Behavior: Behavior is agitated.        Thought Content: Thought content normal.        Cognition and Memory: Cognition and memory normal.        Judgment: Judgment is impulsive.    He has a clear abscess in his antecubital fossa seen below Jul 08, 2018:      Lab Results Lab Results  Component Value Date   WBC 5.7 07/08/2018   HGB 11.7 (L) 07/08/2018   HCT 36.4 (L) 07/08/2018   MCV 85.4 07/08/2018   PLT 184 07/08/2018    Lab Results  Component Value Date   CREATININE 0.80 07/08/2018   BUN 7 07/08/2018   NA 140 07/08/2018   K 3.8 07/08/2018   CL 110 07/08/2018   CO2 24 07/08/2018    Lab Results  Component Value Date   ALT 19 07/07/2018   AST 16 07/07/2018   ALKPHOS 74 07/07/2018   BILITOT 0.3 07/07/2018     Microbiology: Recent Results (from the past 240 hour(s))  Culture, blood (Routine x 2)     Status: Abnormal (Preliminary result)   Collection Time: 07/05/18  8:24 PM  Result Value Ref Range Status   Specimen Description   Final    BLOOD RIGHT FOREARM Performed at [facility], [address], [place], [place] [address]    Special Requests   Final    BOTTLES DRAWN AEROBIC AND  ANAEROBIC Blood Culture adequate volume Performed at [facility], [address] [place], [place] [address]    Culture  Setup Time   Final    YEAST IN BOTH AEROBIC AND ANAEROBIC BOTTLES Gram Stain Report Called to,Read Back By and Verified With: WHITE,M@0943  BY MATTHEWS, B 5.23.2020 CRITICAL RESULT CALLED TO, READ BACK BY AND VERIFIED WITH: T. MEYER, PHARMD (APH) AT 1325 ON 07/07/18 BY C. JESSUP, MLT. Performed at [facility], [address], [place], [place] [address]    Culture CANDIDA ALBICANS (A)  Final   Report Status PENDING  Incomplete  Blood Culture ID Panel (Reflexed)     Status: Abnormal   Collection Time: 07/05/18  8:24 PM  Result Value Ref Range Status   Enterococcus species NOT DETECTED NOT DETECTED Final   Listeria monocytogenes NOT DETECTED NOT DETECTED Final   Staphylococcus species NOT DETECTED NOT DETECTED Final   Staphylococcus aureus (BCID) NOT DETECTED NOT DETECTED Final   Streptococcus species NOT DETECTED NOT DETECTED Final   Streptococcus agalactiae NOT DETECTED NOT DETECTED Final   Streptococcus pneumoniae NOT DETECTED NOT DETECTED Final   Streptococcus pyogenes NOT DETECTED NOT DETECTED Final   Acinetobacter baumannii NOT DETECTED NOT DETECTED Final   Enterobacteriaceae species NOT DETECTED NOT DETECTED Final   Enterobacter cloacae complex NOT DETECTED NOT DETECTED Final   Escherichia coli NOT DETECTED NOT DETECTED Final   Klebsiella oxytoca NOT DETECTED NOT DETECTED Final   Klebsiella pneumoniae NOT DETECTED NOT DETECTED Final   Proteus species NOT DETECTED NOT DETECTED Final   Serratia marcescens NOT DETECTED NOT DETECTED Final   Haemophilus influenzae NOT DETECTED NOT DETECTED Final   Neisseria meningitidis NOT DETECTED NOT DETECTED Final   Pseudomonas aeruginosa NOT DETECTED NOT DETECTED Final   Candida albicans DETECTED (A) NOT DETECTED Final    Comment: CRITICAL RESULT CALLED TO, READ BACK BY AND VERIFIED WITH: T. MEYER, PHARMD (APH) AT 1325  ON 07/07/18 BY C. JESSUP, MLT.    Candida glabrata NOT DETECTED NOT  DETECTED Final   Candida krusei NOT DETECTED NOT DETECTED Final   Candida parapsilosis NOT DETECTED NOT DETECTED Final   Candida tropicalis NOT DETECTED NOT DETECTED Final    Comment: Performed at [facility], [address] [place], [place] [address]  Culture, blood (Routine x 2)     Status: None (Preliminary result)   Collection Time: 07/05/18  8:29 PM  Result Value Ref Range Status   Specimen Description BLOOD  Final   Special Requests NONE  Final   Culture   Final    NO GROWTH 3 DAYS Performed at [facility], [address], [place], [place] [address]    Report Status PENDING  Incomplete  SARS Coronavirus 2 (CEPHEID - Performed in [facility] hospital lab), Hosp Order     Status: None   Collection Time: 07/05/18  9:01 PM  Result Value Ref Range Status   SARS Coronavirus 2 NEGATIVE NEGATIVE Final    Comment: (NOTE) If result is NEGATIVE SARS-CoV-2 target nucleic acids are NOT DETECTED. The SARS-CoV-2 RNA is generally detectable in upper and lower  respiratory specimens during the acute phase of infection. The lowest  concentration of SARS-CoV-2 viral copies this assay can detect is 250  copies / mL. A negative result does not preclude SARS-CoV-2 infection  and should not be used as the sole basis for treatment or other  patient management decisions.  A negative result may occur with  improper specimen collection / handling, submission of specimen other  than nasopharyngeal swab, presence of viral mutation(s) within the  areas targeted by this assay, and inadequate number of viral copies  (<250 copies / mL). A negative result must be combined with clinical  observations, patient history, and epidemiological information. If result is POSITIVE SARS-CoV-2 target nucleic acids are DETECTED. The SARS-CoV-2 RNA is generally detectable in upper and lower  respiratory specimens dur ing the acute phase of  infection.  Positive  results are indicative of active infection with SARS-CoV-2.  Clinical  correlation with patient history and other diagnostic information is  necessary to determine patient infection status.  Positive results do  not rule out bacterial infection or co-infection with other viruses. If result is PRESUMPTIVE POSTIVE SARS-CoV-2 nucleic acids MAY BE PRESENT.   A presumptive positive result was obtained on the submitted specimen  and confirmed on repeat testing.  While 2019 novel coronavirus  (SARS-CoV-2) nucleic acids may be present in the submitted sample  additional confirmatory testing may be necessary for epidemiological  and / or clinical management purposes  to differentiate between  SARS-CoV-2 and other Sarbecovirus currently known to infect humans.  If clinically indicated additional testing with an alternate test  methodology [phone]) is advised. The SARS-CoV-2 RNA is generally  detectable in upper and lower respiratory sp ecimens during the acute  phase of infection. The expected result is Negative. Fact Sheet for Patients:  BoilerBrush.com.cy Fact Sheet for Healthcare Providers: https://pope.com/ This test is not yet approved or cleared by the [place] FDA and has been authorized for detection and/or diagnosis of SARS-CoV-2 by FDA under an Emergency Use Authorization (EUA).  This EUA will remain in effect (meaning this test can be used) for the duration of the COVID-19 declaration under Section 564(b)(1) of the Act, 21 U.S.C. section 360bbb-3(b)(1), unless the authorization is terminated or revoked sooner. Performed at [facility], [address], [place], [place] [address]   SARS Coronavirus 2 (CEPHEID - Performed in [facility] hospital lab), [facility]  Status: None   Collection Time: 07/07/18 10:38 AM  Result Value Ref Range Status   SARS Coronavirus 2 NEGATIVE NEGATIVE Final    Comment: (NOTE)  If result is NEGATIVE SARS-CoV-2 target nucleic acids are NOT DETECTED. The SARS-CoV-2 RNA is generally detectable in upper and lower  respiratory specimens during the acute phase of infection. The lowest  concentration of SARS-CoV-2 viral copies this assay can detect is 250  copies / mL. A negative result does not preclude SARS-CoV-2 infection  and should not be used as the sole basis for treatment or other  patient management decisions.  A negative result may occur with  improper specimen collection / handling, submission of specimen other  than nasopharyngeal swab, presence of viral mutation(s) within the  areas targeted by this assay, and inadequate number of viral copies  (<250 copies / mL). A negative result must be combined with clinical  observations, patient history, and epidemiological information. If result is POSITIVE SARS-CoV-2 target nucleic acids are DETECTED. The SARS-CoV-2 RNA is generally detectable in upper and lower  respiratory specimens dur ing the acute phase of infection.  Positive  results are indicative of active infection with SARS-CoV-2.  Clinical  correlation with patient history and other diagnostic information is  necessary to determine patient infection status.  Positive results do  not rule out bacterial infection or co-infection with other viruses. If result is PRESUMPTIVE POSTIVE SARS-CoV-2 nucleic acids MAY BE PRESENT.   A presumptive positive result was obtained on the submitted specimen  and confirmed on repeat testing.  While 2019 novel coronavirus  (SARS-CoV-2) nucleic acids may be present in the submitted sample  additional confirmatory testing may be necessary for epidemiological  and / or clinical management purposes  to differentiate between  SARS-CoV-2 and other Sarbecovirus currently known to infect humans.  If clinically indicated additional testing with an alternate test  methodology [phone]) is advised. The SARS-CoV-2 RNA is generally   detectable in upper and lower respiratory sp ecimens during the acute  phase of infection. The expected result is Negative. Fact Sheet for Patients:  BoilerBrush.com.cyhttps://www.fda.gov/media/136312/download Fact Sheet for Healthcare Providers: https://pope.com/https://www.fda.gov/media/136313/download This test is not yet approved or cleared by the [place] FDA and has been authorized for detection and/or diagnosis of SARS-CoV-2 by FDA under an Emergency Use Authorization (EUA).  This EUA will remain in effect (meaning this test can be used) for the duration of the COVID-19 declaration under Section 564(b)(1) of the Act, 21 U.S.C. section 360bbb-3(b)(1), unless the authorization is terminated or revoked sooner. Performed at [facility], [address] [place], [place] [address]   Culture, blood (single) w Reflex to ID Panel     Status: None (Preliminary result)   Collection Time: 07/07/18  5:21 PM  Result Value Ref Range Status   Specimen Description BLOOD RIGHT HAND  Final   Special Requests   Final    BOTTLES DRAWN AEROBIC AND ANAEROBIC Blood Culture adequate volume   Culture   Final    NO GROWTH < 24 HOURS Performed at [facility], [address] [place], [place] [address]    Report Status PENDING  Incomplete    Acey Lavornelius Van Dam, MD [facility] [phone] pager  07/08/2018, 2:24 PM

## 2018-07-08 NOTE — Anesthesia Procedure Notes (Signed)
Procedure Name: Intubation Date/Time: 07/08/2018 3:50 PM Performed by: Edmonia Caprio, CRNA Pre-anesthesia Checklist: Patient identified, Suction available, Emergency Drugs available, Patient being monitored and Timeout performed Patient Re-evaluated:Patient Re-evaluated prior to induction Oxygen Delivery Method: Circle system utilized Preoxygenation: Pre-oxygenation with 100% oxygen Induction Type: IV induction Laryngoscope Size: Miller and 2 Grade View: Grade I Tube type: Oral Tube size: 7.5 mm Number of attempts: 1 Airway Equipment and Method: Stylet Secured at: 23 cm Tube secured with: Tape Dental Injury: Teeth and Oropharynx as per pre-operative assessment

## 2018-07-08 NOTE — Op Note (Signed)
Orthopaedic Surgery Operative Note (CSN: [id number])  Timothy Christensen  March 02, 1995 Date of Surgery: 07/08/2018   Diagnoses:  Antecubital fossa abscess on the left arm  Procedure: Left abscess I&D   Operative Finding Successful completion of planned procedure.  Patient had a 4 x 5 cm abscess below the level of the skin that did not seem to track below the level of the fascia.  This was evacuated and washed out completely.  We left the incision open.  A single 4 x 4 was partially inserted into the incision which was be easily changed with his dressing change on postop day 1.  Post-operative plan: The patient will be readmitted to the floor.  The patient will be okay for discharge postop day 1 at the medicine service discretion.  DVT prophylaxis not indicated in ambulatory upper extremity patient.  Pain control with PRN pain medication and I would not discharge the patient on any narcotics for this issue.  Maximize both Tylenol as well as oral anti-inflammatories like Celebrex or meloxicam.  Follow up plan will be scheduled in approximately 7 days for incision check and XR.  Post-Op Diagnosis: Same Surgeons:Primary: Bjorn Pippin, MD Assistants: [name], OPAC Location: [facility] OR ROOM 08 Anesthesia: General Antibiotics: Ancef 2g preop, Vancomycin 1000mg  locally tobramycin 1.2 g Tourniquet time: 20 Estimated Blood Loss: Minimal Complications: None Specimens: Left arm #1 and 2 Implants: * No implants in log *  Indications for Surgery:   Timothy Christensen is a 23 y.o. male with IV drug use and multiple trips to the hospital leaving AMA before an I&D of his left antecubital fossa abscess could be performed.  His pain brought him back to the hospital and at this point he consented with surgery.  Benefits and risks of operative and nonoperative management were discussed prior to surgery with patient/guardian(s) and informed consent form was completed.  Specific risks including infection, need for additional  surgery, nerve and vessel damage, need for further surgery, need for dressing changes.   Procedure:   The patient was identified in the preoperative holding area where the surgical site was marked. The patient was taken to the OR where a procedural timeout was called and the above noted anesthesia was induced.  The patient was positioned supine on a hand table.  Preoperative antibiotics were dosed.  The patient's left arm was prepped and draped in the usual sterile fashion.  A second preoperative timeout was called.      A tourniquet was used for the above listed time.   The abscess was clearly visible in the antecubital fossa and made a longitudinal incision just proximal to the flexion crease about 3 cm in length.  Purulent material was immediately expressed.  This was taken for culture.  We then gently debrided the interior of the cavity which measured 4 x 5 cm with a Ray-Tec as well as sharply excisionally debriding subcutaneous tissue and fat.  The fascia and the deep structures did not seem to be involved.  There is no other tracking to be noted.  Any loculations were opened.  We then irrigated with 6 L normal saline and left the incision open.  A 4 x 4 was partially inserted to allow the incision to stay open.  A sterile dressing was placed.   The patient was awoken from general anesthesia and taken to the PACU in stable condition without complication.    [name], OPA-C, present and scrubbed throughout the case, critical for completion in a timely fashion,  and for retraction, instrumentation, closure.

## 2018-07-08 NOTE — Progress Notes (Signed)
Received report from PACU that procedure went well and that patient would be coming back to the unit shortly.  Vital signs stable. Patient received 4 mg Dilaudid and 5 mg oxycodone.

## 2018-07-08 NOTE — Progress Notes (Addendum)
Patient came back from PACU with PACU, RN Vernona Rieger at 1805.  Patient complained of intractable pain and demanding to speak to the doctor immediately or he will go AMA.  Called provider Dr. Tyson Babinski, MD who spoke with the patient at length by a 20 min telephone call about the risks of leaving AMA especially without having fluconazole IV treatment.  I offered to administer IV fluconazale to patient who declined stating "I'm not staying here in pain".  Dr. Tyson Babinski, MD advised patient per telephone call of the risks and benefits of staying to receive treatment including IV fluconazole.  Patient is declining treatment at this time and would like to go AMA.  Patient requested higher doses of pain medication and more pain medication from Dr. Tyson Babinski, MD which he declined but offered to write a prescription to be send to a pharmacy for po fluconazole.  Pt declined physician's offer to have prescription written, stating "I will get my own drugs off the street".  Pt still has IV in at this time and is refusing to let me remove it.  I am standing outside his room while he makes a phone call which he demanded to make before he would allow me to remove his IV.  Charge nurse Kizzie Furnish, RN has been advised of the situation including patient declining treatment offered by physician and nurse, declaring to leave AMA, and still having IV present. At end of shift, pt is still unsure of whether he will stay or go.  Handed off report to night nurse, Nedra Hai, RN.

## 2018-07-08 NOTE — Progress Notes (Signed)
Patient's pain level evaluated after keterolac (Tordol) administration 7/10.

## 2018-07-08 NOTE — Progress Notes (Signed)
Patient given keterolac (Tordol) for pain 10/10 per order by Dr. Tyson Babinski, MD.  Will continue to monitor pain level.

## 2018-07-08 NOTE — Progress Notes (Signed)
Per Dr. Tyson Babinski, MD Dilaudid dose increased form 0.5 mg to 1 mg and could be given early, due to medical necessity pain 10+/10.  Will continue to monitor pain and will page MD if pain still unrelenting, uncontrolled.

## 2018-07-08 NOTE — Progress Notes (Signed)
Dilaudid 1 mg IV given at 1233 for pain 10/10.  At 1245, patient requested additional pain medication.  Explained to pain that he was not due for additional pain medication and would be going to surgery shortly.

## 2018-07-08 NOTE — Progress Notes (Signed)
Patient requesting to speak to on call provider "face to face" MD paged awaiting reply.

## 2018-07-08 NOTE — Progress Notes (Signed)
PROGRESS NOTE  Timothy Christensen OTL:[id number] DOB: [birth year] DOA: 07/07/2018 PCP: Patient, No Pcp Per   LOS: 1 day   Brief narrative: Timothy Christensen  is a 23 y.o. male, with past medical history significant for IV drug abuse presenting for evaluation of left elbow abscess.  His last IV drug abuse was 3 weeks ago and this problem started around 10 days ago.  [facility] and he was started on vancomycin and micafungin after blood culture came back positive for yeast.  ID consulted on the patient.  Patient is noncompliant and left AMA x2 times.  He was seen by Dr. Romeo Apple he needs to be consulted in a.m. for I&D.  Patient denies any chest pains, shortness of breath, nausea vomiting or diarrhea.  Subjective: Patient complains of intractable left elbow pain.  Denies any nausea, vomiting shortness of breath or chest pain.  He states that his pain medication regimen is not helping his pain.  Assessment/Plan:  Principal Problem:   Cellulitis and abscess of upper extremity Active Problems:   IVDU (intravenous drug user)/Opiates/Cocaine   Noncompliance   Cocaine abuse (HCC)   Sepsis (HCC)  Severe pain on the left elbow secondary to cellulitis/left elbow abscess leading to sepsis, present on admission.  Spoke with orthopedic surgery on call.  Will get a CT scan of the elbow with IV contrast.  Keep the patient n.p.o.  Continue on broad-spectrum antibiotics with IV vancomycin and Eraxis as per the blood culture report from previous.  Patient had yeast on blood culture from 5/ 21.  Will likely need infectious disease consultation.  We will adjust medications for pain and Toradol.  Polysubstance substance abuse, IV drug abuse.  History of cocaine abuse as well.  Will closely monitor.  Currently on IV narcotics for pain.  Watch for withdrawal.  Medical noncompliance Patient left [facility] AMA.  Motivated to get treated this time.  VTE Prophylaxis: Lovenox  Code Status: Full  code  Family Communication: None  Disposition Plan: Home likely in 2 to 3 days, await for orthopedics evaluation and treatment, will likely need ID evaluation    Consultants:  Orthopedics  Procedures:  None yet  Antibiotics: Anti-infectives (From admission, onward)   Start     Dose/Rate Route Frequency Ordered Stop   07/08/18 1230  anidulafungin (ERAXIS) 200 mg in sodium chloride 0.9 % 200 mL IVPB     200 mg 78 mL/hr over 200 Minutes Intravenous Every 24 hours 07/07/18 2211     07/08/18 1230  anidulafungin (ERAXIS) 100 mg in sodium chloride 0.9 % 100 mL IVPB  Status:  Discontinued     100 mg 78 mL/hr over 100 Minutes Intravenous Every 24 hours 07/07/18 2022 07/07/18 2214   07/07/18 2100  vancomycin (VANCOCIN) IVPB 1000 mg/200 mL premix     1,000 mg 200 mL/hr over 60 Minutes Intravenous Every 8 hours 07/07/18 2022       Objective: Vitals:   07/07/18 2218 07/08/18 0310  BP: 125/86 117/80  Pulse: 61   Resp: 16 (!) 21  Temp: 98.4 F (36.9 C) 98 F (36.7 C)  SpO2: 97% 99%    Intake/Output Summary (Last 24 hours) at 07/08/2018 1017 Last data filed at 07/08/2018 0800 Gross per 24 hour  Intake 1789.45 ml  Output --  Net 1789.45 ml   Filed Weights   07/07/18 1651 07/07/18 2218  Weight: 68 kg 68.8 kg   Body mass index is 20.56 kg/m.   Physical Exam:  GENERAL: Patient is alert awake and oriented.  Mild distress due to pain. HENT: No scleral pallor or icterus. Pupils equally reactive to light. Oral mucosa is moist NECK: is supple, no palpable thyroid enlargement. CHEST: Clear to auscultation. No crackles or wheezes. Non tender on palpation. Diminished breath sounds bilaterally. CVS: S1 and S2 heard, no murmur. Regular rate and rhythm. No pericardial rub. ABDOMEN: Soft, non-tender, bowel sounds are present. No palpable hepato-splenomegaly. EXTREMITIES: No lower extremity edema.  Left elbow with a significant edema cellulitis/erythema and abscess.  Tenderness on  palpation with restriction of movement due to pain CNS: Cranial nerves are intact. No focal motor or sensory deficits. SKIN: warm and dry , left elbow region cellulitis with abscess  Data Review: I have personally reviewed the following laboratory data and studies,  CBC: Recent Labs  Lab 07/05/18 2024 07/06/18 0535 07/07/18 0555 07/07/18 1721 07/08/18 0703  WBC 4.7 3.7* 5.8 5.0 5.7  NEUTROABS 4.1  --  4.2  --   --   HGB 14.3 13.5 13.0 13.3 11.7*  HCT 44.6 42.4 39.4 41.2 36.4*  MCV 85.4 87.1 85.1 85.7 85.4  PLT 179 156 194 189 184   Basic Metabolic Panel: Recent Labs  Lab 07/05/18 2024 07/06/18 0535 07/07/18 0555 07/07/18 1721 07/08/18 0703  NA 135 141 141 136 140  K 3.4* 3.9 3.4* 3.9 3.8  CL 100 110 106 106 110  CO2 23 26 25 23 24   GLUCOSE 125* 103* 103* 110* 117*  BUN 9 8 5* <5* 7  CREATININE 0.91 0.74 0.70 0.82 0.80  CALCIUM 8.8* 8.2* 8.8* 8.7* 8.3*   Liver Function Tests: Recent Labs  Lab 07/05/18 2024 07/07/18 0555 07/07/18 1721  AST 16 16 16   ALT 13 16 19   ALKPHOS 78 68 74  BILITOT 0.3 0.4 0.3  PROT 7.1 6.5 6.4*  ALBUMIN 3.9 3.6 3.5   No results for input(s): LIPASE, AMYLASE in the last 168 hours. No results for input(s): AMMONIA in the last 168 hours. Cardiac Enzymes: No results for input(s): CKTOTAL, CKMB, CKMBINDEX, TROPONINI in the last 168 hours. BNP (last 3 results) No results for input(s): BNP in the last 8760 hours.  ProBNP (last 3 results) No results for input(s): PROBNP in the last 8760 hours.  CBG: Recent Labs  Lab 07/06/18 0716  GLUCAP 125*   Recent Results (from the past 240 hour(s))  Culture, blood (Routine x 2)     Status: None (Preliminary result)   Collection Time: 07/05/18  8:24 PM  Result Value Ref Range Status   Specimen Description   Final    BLOOD RIGHT FOREARM Performed at [facility], [address] [place], [place] [address]    Special Requests   Final    BOTTLES DRAWN AEROBIC AND ANAEROBIC Blood Culture  adequate volume Performed at [facility], [address] [place], [place] [address]    Culture  Setup Time   Final    YEAST IN BOTH AEROBIC AND ANAEROBIC BOTTLES Gram Stain Report Called to,Read Back By and Verified With: WHITE,M@[id number]  BY MATTHEWS, B 5.23.2020 CRITICAL RESULT CALLED TO, READ BACK BY AND VERIFIED WITH: T. MEYER, PHARMD (APH) AT 1325 ON 07/07/18 BY C. JESSUP, MLT.    Culture   Final    YEAST CULTURE REINCUBATED FOR BETTER GROWTH Performed at [facility], [address] [place], [place] [address]    Report Status PENDING  Incomplete  Blood Culture ID Panel (Reflexed)     Status: Abnormal  Collection Time: 07/05/18  8:24 PM  Result Value Ref Range Status   Enterococcus species NOT DETECTED NOT DETECTED Final   Listeria monocytogenes NOT DETECTED NOT DETECTED Final   Staphylococcus species NOT DETECTED NOT DETECTED Final   Staphylococcus aureus (BCID) NOT DETECTED NOT DETECTED Final   Streptococcus species NOT DETECTED NOT DETECTED Final   Streptococcus agalactiae NOT DETECTED NOT DETECTED Final   Streptococcus pneumoniae NOT DETECTED NOT DETECTED Final   Streptococcus pyogenes NOT DETECTED NOT DETECTED Final   Acinetobacter baumannii NOT DETECTED NOT DETECTED Final   Enterobacteriaceae species NOT DETECTED NOT DETECTED Final   Enterobacter cloacae complex NOT DETECTED NOT DETECTED Final   Escherichia coli NOT DETECTED NOT DETECTED Final   Klebsiella oxytoca NOT DETECTED NOT DETECTED Final   Klebsiella pneumoniae NOT DETECTED NOT DETECTED Final   Proteus species NOT DETECTED NOT DETECTED Final   Serratia marcescens NOT DETECTED NOT DETECTED Final   Haemophilus influenzae NOT DETECTED NOT DETECTED Final   Neisseria meningitidis NOT DETECTED NOT DETECTED Final   Pseudomonas aeruginosa NOT DETECTED NOT DETECTED Final   Candida albicans DETECTED (A) NOT DETECTED Final    Comment: CRITICAL RESULT CALLED TO, READ BACK BY AND VERIFIED WITH: T. MEYER, PHARMD (APH)  AT 1325 ON 07/07/18 BY C. JESSUP, MLT.    Candida glabrata NOT DETECTED NOT DETECTED Final   Candida krusei NOT DETECTED NOT DETECTED Final   Candida parapsilosis NOT DETECTED NOT DETECTED Final   Candida tropicalis NOT DETECTED NOT DETECTED Final    Comment: Performed at [facility], [address], [place], [place] [address]  Culture, blood (Routine x 2)     Status: None (Preliminary result)   Collection Time: 07/05/18  8:29 PM  Result Value Ref Range Status   Specimen Description BLOOD  Final   Special Requests NONE  Final   Culture   Final    NO GROWTH 3 DAYS Performed at [facility], [address], [place], [place] [address]    Report Status PENDING  Incomplete  SARS Coronavirus 2 (CEPHEID - Performed in [facility] hospital lab), Hosp Order     Status: None   Collection Time: 07/05/18  9:01 PM  Result Value Ref Range Status   SARS Coronavirus 2 NEGATIVE NEGATIVE Final    Comment: (NOTE) If result is NEGATIVE SARS-CoV-2 target nucleic acids are NOT DETECTED. The SARS-CoV-2 RNA is generally detectable in upper and lower  respiratory specimens during the acute phase of infection. The lowest  concentration of SARS-CoV-2 viral copies this assay can detect is 250  copies / mL. A negative result does not preclude SARS-CoV-2 infection  and should not be used as the sole basis for treatment or other  patient management decisions.  A negative result may occur with  improper specimen collection / handling, submission of specimen other  than nasopharyngeal swab, presence of viral mutation(s) within the  areas targeted by this assay, and inadequate number of viral copies  (<250 copies / mL). A negative result must be combined with clinical  observations, patient history, and epidemiological information. If result is POSITIVE SARS-CoV-2 target nucleic acids are DETECTED. The SARS-CoV-2 RNA is generally detectable in upper and lower  respiratory specimens dur ing the acute phase  of infection.  Positive  results are indicative of active infection with SARS-CoV-2.  Clinical  correlation with patient history and other diagnostic information is  necessary to determine patient infection status.  Positive results do  not rule out bacterial infection  or co-infection with other viruses. If result is PRESUMPTIVE POSTIVE SARS-CoV-2 nucleic acids MAY BE PRESENT.   A presumptive positive result was obtained on the submitted specimen  and confirmed on repeat testing.  While 2019 novel coronavirus  (SARS-CoV-2) nucleic acids may be present in the submitted sample  additional confirmatory testing may be necessary for epidemiological  and / or clinical management purposes  to differentiate between  SARS-CoV-2 and other Sarbecovirus currently known to infect humans.  If clinically indicated additional testing with an alternate test  methodology [phone]) is advised. The SARS-CoV-2 RNA is generally  detectable in upper and lower respiratory sp ecimens during the acute  phase of infection. The expected result is Negative. Fact Sheet for Patients:  BoilerBrush.com.cy Fact Sheet for Healthcare Providers: https://pope.com/ This test is not yet approved or cleared by the [place] FDA and has been authorized for detection and/or diagnosis of SARS-CoV-2 by FDA under an Emergency Use Authorization (EUA).  This EUA will remain in effect (meaning this test can be used) for the duration of the COVID-19 declaration under Section 564(b)(1) of the Act, 21 U.S.C. section 360bbb-3(b)(1), unless the authorization is terminated or revoked sooner. Performed at [facility], [address], [place], [place] [address]   SARS Coronavirus 2 (CEPHEID - Performed in [facility] hospital lab), Hosp Order     Status: None   Collection Time: 07/07/18 10:38 AM  Result Value Ref Range Status   SARS Coronavirus 2 NEGATIVE NEGATIVE Final    Comment:  (NOTE) If result is NEGATIVE SARS-CoV-2 target nucleic acids are NOT DETECTED. The SARS-CoV-2 RNA is generally detectable in upper and lower  respiratory specimens during the acute phase of infection. The lowest  concentration of SARS-CoV-2 viral copies this assay can detect is 250  copies / mL. A negative result does not preclude SARS-CoV-2 infection  and should not be used as the sole basis for treatment or other  patient management decisions.  A negative result may occur with  improper specimen collection / handling, submission of specimen other  than nasopharyngeal swab, presence of viral mutation(s) within the  areas targeted by this assay, and inadequate number of viral copies  (<250 copies / mL). A negative result must be combined with clinical  observations, patient history, and epidemiological information. If result is POSITIVE SARS-CoV-2 target nucleic acids are DETECTED. The SARS-CoV-2 RNA is generally detectable in upper and lower  respiratory specimens dur ing the acute phase of infection.  Positive  results are indicative of active infection with SARS-CoV-2.  Clinical  correlation with patient history and other diagnostic information is  necessary to determine patient infection status.  Positive results do  not rule out bacterial infection or co-infection with other viruses. If result is PRESUMPTIVE POSTIVE SARS-CoV-2 nucleic acids MAY BE PRESENT.   A presumptive positive result was obtained on the submitted specimen  and confirmed on repeat testing.  While 2019 novel coronavirus  (SARS-CoV-2) nucleic acids may be present in the submitted sample  additional confirmatory testing may be necessary for epidemiological  and / or clinical management purposes  to differentiate between  SARS-CoV-2 and other Sarbecovirus currently known to infect humans.  If clinically indicated additional testing with an alternate test  methodology [phone]) is advised. The SARS-CoV-2 RNA is  generally  detectable in upper and lower respiratory sp ecimens during the acute  phase of infection. The expected result is Negative. Fact Sheet for Patients:  BoilerBrush.com.cy Fact Sheet for Healthcare Providers: https://pope.com/ This test  is not yet approved or cleared by the [place] and has been authorized for detection and/or diagnosis of SARS-CoV-2 by FDA under an Emergency Use Authorization (EUA).  This EUA will remain in effect (meaning this test can be used) for the duration of the COVID-19 declaration under Section 564(b)(1) of the Act, 21 U.S.C. section 360bbb-3(b)(1), unless the authorization is terminated or revoked sooner. Performed at [facility], [address], [place], [place] [address]      Studies: Dg Elbow Complete Left  Result Date: 07/07/2018 CLINICAL DATA:  Medial soft tissue swelling.  Rule out abscess. EXAM: LEFT ELBOW - COMPLETE 3+ VIEW COMPARISON:  None. FINDINGS: Normal alignment. No fracture or arthropathy. Joint spaces normal. Negative for osteomyelitis. No foreign body in the soft tissues. No gas in the soft tissues. Soft tissue swelling proximal medial forearm. IMPRESSION: Soft tissue swelling otherwise negative Electronically Signed   By: Marlan Palau M.D.   On: 07/07/2018 07:25    Scheduled Meds:  enoxaparin (LOVENOX) injection  40 mg Subcutaneous Q24H   nicotine  14 mg Transdermal Daily    Continuous Infusions:  sodium chloride 100 mL/hr at 07/08/18 0400   anidulafungin     vancomycin 1,000 mg (07/08/18 0525)     Joycelyn Das, MD  [facility] 07/08/2018

## 2018-07-09 LAB — BLOOD CULTURE ID PANEL (REFLEXED)

## 2018-07-09 LAB — CULTURE, BLOOD (ROUTINE X 2): Special Requests: ADEQUATE

## 2018-07-09 MED ORDER — FLUCONAZOLE 200 MG PO TABS: 400.0000 mg | ORAL_TABLET | Freq: Every day | ORAL | 0 refills | Status: AC

## 2018-07-09 MED ORDER — FLUCONAZOLE 100 MG PO TABS
400.0000 mg | ORAL_TABLET | Freq: Every day | ORAL | Status: DC
  Administered 2018-07-09: 400 mg via ORAL
  Filled 2018-07-09: qty 4

## 2018-07-09 MED ORDER — OXYCODONE HCL 5 MG PO TABS
5.0000 mg | ORAL_TABLET | Freq: Once | ORAL | Status: AC
  Administered 2018-07-09: 5 mg via ORAL
  Filled 2018-07-09: qty 1

## 2018-07-09 MED ORDER — AMOXICILLIN-POT CLAVULANATE 875-125 MG PO TABS
1.0000 | ORAL_TABLET | Freq: Two times a day (BID) | ORAL | Status: DC
  Administered 2018-07-09: 1 via ORAL
  Filled 2018-07-09 (×2): qty 1

## 2018-07-09 MED ORDER — AMOXICILLIN-POT CLAVULANATE 875-125 MG PO TABS: 1.0000 | ORAL_TABLET | Freq: Two times a day (BID) | ORAL | 0 refills | Status: AC

## 2018-07-09 NOTE — Progress Notes (Addendum)
Note below from [name] is reasonble.  Timothy Christensen left AMA before I could examine as well.  Timothy Christensen should f/u prn    Timothy Christensen ID: Timothy Christensen, male   DOB: 08-13-1995, 23 y.o.   MRN: [id number]     Subjective:  Timothy Christensen reports pain as mild to moderate.  Timothy Christensen in the bed and in no acute distress.  Wanting more IV pain medicine  Objective:   VITALS:   Vitals:   07/08/18 2356 07/09/18 0550 07/09/18 0808 07/09/18 1204  BP: 131/83 116/71 (!) 137/97 119/78  Pulse: (!) 54   (!) 50  Resp: 15 20 (!) 23 20  Temp: 98.1 F (36.7 C) 98.4 F (36.9 C)  98 F (36.7 C)  TempSrc: Oral Oral  Oral  SpO2: 96% 99%  98%  Weight:      Height:        ABD soft Sensation intact distally Dorsiflexion/Plantar flexion intact Incision: dressing C/D/I and no drainage   Lab Results  Component Value Date   WBC 5.7 07/08/2018   HGB 11.7 (L) 07/08/2018   HCT 36.4 (L) 07/08/2018   MCV 85.4 07/08/2018   PLT 184 07/08/2018   BMET    Component Value Date/Time   NA 140 07/08/2018 0703   K 3.8 07/08/2018 0703   CL 110 07/08/2018 0703   CO2 24 07/08/2018 0703   GLUCOSE 117 (H) 07/08/2018 0703   BUN 7 07/08/2018 0703   CREATININE 0.80 07/08/2018 0703   CALCIUM 8.3 (L) 07/08/2018 0703   GFRNONAA >60 07/08/2018 0703   GFRAA >60 07/08/2018 0703     Assessment/Plan: 1 Day Post-Op   Principal Problem:   Cellulitis and abscess of upper extremity Active Problems:   IVDU (intravenous drug user)/Opiates/Cocaine   Noncompliance   Cocaine abuse (HCC)   Sepsis (HCC)   Advance diet Up with therapy Continue PO pain regimen Continue plan per medicine WBAT Dry dressing today with next medicine dose DC when clear with medicine   Timothy Christensen 07/09/2018, 1:52 PM   Timothy Christensen

## 2018-07-09 NOTE — Progress Notes (Signed)
Patient c/o of pain 7/10 in his left arm after dressing change, Pokhrel MD notified Oxy 5mg  given to the patient as ordered. Patient walked off the unit with the charge RN along with his personal belongings, he refused a wheelchair.

## 2018-07-09 NOTE — Consult Note (Signed)
WOC Nurse wound consult note Patient receiving care in [facility] 4E27.  Underwent I&D of left AC abscess by Dr. Osa Craver. Reason for Consult: wound care for the left [facility] Wound type: surgical Dressing procedure/placement/frequency: Per Dr. Algis Downs. Varkey's note on 07/08/18 at 1627, Partially insert a 4 x 4 gauze into the left antecubital fossa wound. Secure with tape or kerlex. Change daily. Per Dr. Algis Downs. Varkey's note from 07/08/18 at 1627.  This is the plan of care for the wound outlined by the performing surgeon. I am simply entering orders for this site. Monitor the wound area(s) for worsening of condition such as: Signs/symptoms of infection,  Increase in size,  Development of or worsening of odor, Development of pain, or increased pain at the affected locations.  Notify the medical team if any of these develop.  Thank you for the consult.  WOC nurse will not follow at this time.  Please re-consult the WOC team if needed.  Helmut Muster, RN, MSN, CWOCN, CNS-BC, pager [phone]

## 2018-07-09 NOTE — TOC Transition Note (Addendum)
Transition of Care [facility]) - CM/SW Discharge Note   Patient Details  Name: Richey Soriano MRN: [id number] Date of Birth: 1995/05/28  Transition of Care [facility]) CM/SW Contact:  Bess Kinds, RN Phone Number: [phone] 07/09/2018, 2:28 PM   Clinical Narrative:    Spoke with bedside nurse via telephone. Patient to transition home today. Consult received for medication assistance d/t patient not having insurance. Match letter provided. Patient stated to nurse that he could afford $3 copay for each medication. Unable to fill prescription in the hospital d/t [facility] pharmacy closed today. No other transition of care needs identified at this time.   Final next level of care: Home/Self Care Barriers to Discharge: No Barriers Identified   Patient Goals and CMS Choice     Choice offered to / list presented to : NA  Discharge Placement                       Discharge Plan and Services                DME Arranged: N/A DME Agency: NA       HH Arranged: NA HH Agency: NA        Social Determinants of Health (SDOH) Interventions     Readmission Risk Interventions No flowsheet data found.

## 2018-07-09 NOTE — Progress Notes (Addendum)
Pt remains in bed sleeping. Awakened and inquired about plan for discharge. Pt says he is in a "bad mood" and in "a lot of pain" and doesn't want to talk right now. Claims he is being discharged against his will. Reminded of his initial discussion and request to leave this a.m to MD. Pt then says he can't change his dressing or buy his medication. Supplies for wound care given. Match letter and list of participating pharmacies had been given and pt reminded.  Received permission from MD to given p.m dose of antibiotic to pt to take home. MD notified of pt behavior. Informed pt that discharge order remains unchanged. Given list of primary care providers and encouraged to follow up ASAP. Pt then c/o chest pain and SOB. Refused to allow nurses to assess him/get V/S. Pt then requested his dressing be changed to his arm so he could "go". Fellow nurse in room doing wound care. CN notified of pt's hesitancy to discharge.

## 2018-07-09 NOTE — Progress Notes (Signed)
Patient ID: Timothy Christensen, male   DOB: 10/26/1995, 23 y.o.   MRN: [id number]         [facility]  Date of Admission:  07/07/2018           Day 3 antifungal therapy        Day 2 fluconazole ASSESSMENT: He has a polymicrobial of left antecubital fossa abscess complicating recent drug use also had transient fungemia with Candida albicans.  Repeat blood cultures are negative for Candida.  He is threatening to leave AGAINST MEDICAL ADVICE.  I recommend 2 weeks of oral fluconazole and amoxicillin clavulanate.  Hopefully, we can get him a supply from the hospital pharmacy before he leaves.  PLAN: 1. Oral fluconazole and amoxicillin clavulanate  Principal Problem:   Cellulitis and abscess of upper extremity Active Problems:   IVDU (intravenous drug user)/Opiates/Cocaine   Noncompliance   Cocaine abuse (HCC)   Sepsis (HCC)   Scheduled Meds: . amoxicillin-clavulanate  1 tablet Oral Q12H  . enoxaparin (LOVENOX) injection  40 mg Subcutaneous Q24H  . fluconazole  400 mg Oral Daily  . nicotine  14 mg Transdermal Daily   Continuous Infusions: . sodium chloride Stopped (07/09/18 0908)   PRN Meds:.acetaminophen **OR** acetaminophen, HYDROmorphone (DILAUDID) injection, ketorolac, ondansetron **OR** ondansetron (ZOFRAN) IV, oxyCODONE   SUBJECTIVE: He is feeling better.  He says that he is going to leave the hospital today.  He does not have any medical insurance.  Review of Systems: Review of Systems  Constitutional: Negative for chills, diaphoresis and fever.  Respiratory: Negative for cough.   Cardiovascular: Negative for chest pain.  Gastrointestinal: Negative for abdominal pain, diarrhea, nausea and vomiting.  Genitourinary: Negative for dysuria.  Musculoskeletal: Positive for joint pain.  Psychiatric/Behavioral: Positive for substance abuse.    No Known Allergies  OBJECTIVE: Vitals:   07/08/18 2000 07/08/18 2356 07/09/18 0550 07/09/18 0808  BP:  131/83  116/71 (!) 137/97  Pulse:  (!) 54    Resp:  15 20 (!) 23  Temp: 98.4 F (36.9 C) 98.1 F (36.7 C) 98.4 F (36.9 C)   TempSrc: Oral Oral Oral   SpO2:  96% 99%   Weight:      Height:       Body mass index is 20.56 kg/m.  Physical Exam Constitutional:      Comments: He is resting quietly in bed.  He does not appear to be in any distress.  Cardiovascular:     Rate and Rhythm: Normal rate and regular rhythm.     Heart sounds: No murmur.  Musculoskeletal:     Comments: Ace wrap on left arm.  Psychiatric:        Mood and Affect: Mood normal.     Lab Results Lab Results  Component Value Date   WBC 5.7 07/08/2018   HGB 11.7 (L) 07/08/2018   HCT 36.4 (L) 07/08/2018   MCV 85.4 07/08/2018   PLT 184 07/08/2018    Lab Results  Component Value Date   CREATININE 0.80 07/08/2018   BUN 7 07/08/2018   NA 140 07/08/2018   K 3.8 07/08/2018   CL 110 07/08/2018   CO2 24 07/08/2018    Lab Results  Component Value Date   ALT 19 07/07/2018   AST 16 07/07/2018   ALKPHOS 74 07/07/2018   BILITOT 0.3 07/07/2018     Microbiology: Recent Results (from the past 240 hour(s))  Culture, blood (Routine x 2)     Status: Abnormal  Collection Time: 07/05/18  8:24 PM  Result Value Ref Range Status   Specimen Description   Final    BLOOD RIGHT FOREARM Performed at [facility], [address], [place], [place] [address]    Special Requests   Final    BOTTLES DRAWN AEROBIC AND ANAEROBIC Blood Culture adequate volume Performed at [facility], [address], [place], [place] [address]    Culture  Setup Time   Final    YEAST IN BOTH AEROBIC AND ANAEROBIC BOTTLES Gram Stain Report Called to,Read Back By and Verified With: WHITE,M@[id number]  BY MATTHEWS, B 5.23.2020 CRITICAL RESULT CALLED TO, READ BACK BY AND VERIFIED WITH: T. MEYER, PHARMD (APH) AT 1325 ON 07/07/18 BY C. JESSUP, MLT. Performed at [facility], [address] [place], [place] [address]    Culture CANDIDA ALBICANS (A)   Final   Report Status 07/09/2018 FINAL  Final  Blood Culture ID Panel (Reflexed)     Status: Abnormal   Collection Time: 07/05/18  8:24 PM  Result Value Ref Range Status   Enterococcus species NOT DETECTED NOT DETECTED Final   Listeria monocytogenes NOT DETECTED NOT DETECTED Final   Staphylococcus species NOT DETECTED NOT DETECTED Final   Staphylococcus aureus (BCID) NOT DETECTED NOT DETECTED Final   Streptococcus species NOT DETECTED NOT DETECTED Final   Streptococcus agalactiae NOT DETECTED NOT DETECTED Final   Streptococcus pneumoniae NOT DETECTED NOT DETECTED Final   Streptococcus pyogenes NOT DETECTED NOT DETECTED Final   Acinetobacter baumannii NOT DETECTED NOT DETECTED Final   Enterobacteriaceae species NOT DETECTED NOT DETECTED Final   Enterobacter cloacae complex NOT DETECTED NOT DETECTED Final   Escherichia coli NOT DETECTED NOT DETECTED Final   Klebsiella oxytoca NOT DETECTED NOT DETECTED Final   Klebsiella pneumoniae NOT DETECTED NOT DETECTED Final   Proteus species NOT DETECTED NOT DETECTED Final   Serratia marcescens NOT DETECTED NOT DETECTED Final   Haemophilus influenzae NOT DETECTED NOT DETECTED Final   Neisseria meningitidis NOT DETECTED NOT DETECTED Final   Pseudomonas aeruginosa NOT DETECTED NOT DETECTED Final   Candida albicans DETECTED (A) NOT DETECTED Final    Comment: CRITICAL RESULT CALLED TO, READ BACK BY AND VERIFIED WITH: T. MEYER, PHARMD (APH) AT 1325 ON 07/07/18 BY C. JESSUP, MLT.    Candida glabrata NOT DETECTED NOT DETECTED Final   Candida krusei NOT DETECTED NOT DETECTED Final   Candida parapsilosis NOT DETECTED NOT DETECTED Final   Candida tropicalis NOT DETECTED NOT DETECTED Final    Comment: Performed at [facility], [address] [place], [place] [address]  Culture, blood (Routine x 2)     Status: None (Preliminary result)   Collection Time: 07/05/18  8:29 PM  Result Value Ref Range Status   Specimen Description BLOOD  Final   Special  Requests NONE  Final   Culture   Final    NO GROWTH 4 DAYS Performed at [facility], [address], [place], [place] [address]    Report Status PENDING  Incomplete  SARS Coronavirus 2 (CEPHEID - Performed in [facility] hospital lab), Hosp Order     Status: None   Collection Time: 07/05/18  9:01 PM  Result Value Ref Range Status   SARS Coronavirus 2 NEGATIVE NEGATIVE Final    Comment: (NOTE) If result is NEGATIVE SARS-CoV-2 target nucleic acids are NOT DETECTED. The SARS-CoV-2 RNA is generally detectable in upper and lower  respiratory specimens during the acute phase of infection. The lowest  concentration of  SARS-CoV-2 viral copies this assay can detect is 250  copies / mL. A negative result does not preclude SARS-CoV-2 infection  and should not be used as the sole basis for treatment or other  patient management decisions.  A negative result may occur with  improper specimen collection / handling, submission of specimen other  than nasopharyngeal swab, presence of viral mutation(s) within the  areas targeted by this assay, and inadequate number of viral copies  (<250 copies / mL). A negative result must be combined with clinical  observations, patient history, and epidemiological information. If result is POSITIVE SARS-CoV-2 target nucleic acids are DETECTED. The SARS-CoV-2 RNA is generally detectable in upper and lower  respiratory specimens dur ing the acute phase of infection.  Positive  results are indicative of active infection with SARS-CoV-2.  Clinical  correlation with patient history and other diagnostic information is  necessary to determine patient infection status.  Positive results do  not rule out bacterial infection or co-infection with other viruses. If result is PRESUMPTIVE POSTIVE SARS-CoV-2 nucleic acids MAY BE PRESENT.   A presumptive positive result was obtained on the submitted specimen  and confirmed on repeat testing.  While 2019 novel coronavirus   (SARS-CoV-2) nucleic acids may be present in the submitted sample  additional confirmatory testing may be necessary for epidemiological  and / or clinical management purposes  to differentiate between  SARS-CoV-2 and other Sarbecovirus currently known to infect humans.  If clinically indicated additional testing with an alternate test  methodology [id number]) is advised. The SARS-CoV-2 RNA is generally  detectable in upper and lower respiratory sp ecimens during the acute  phase of infection. The expected result is Negative. Fact Sheet for Patients:  BoilerBrush.com.cy Fact Sheet for Healthcare Providers: https://pope.com/ This test is not yet approved or cleared by the [place] FDA and has been authorized for detection and/or diagnosis of SARS-CoV-2 by FDA under an Emergency Use Authorization (EUA).  This EUA will remain in effect (meaning this test can be used) for the duration of the COVID-19 declaration under Section 564(b)(1) of the Act, 21 U.S.C. section 360bbb-3(b)(1), unless the authorization is terminated or revoked sooner. Performed at [facility], [address], [place], [place] [address]   SARS Coronavirus 2 (CEPHEID - Performed in [facility] hospital lab), Hosp Order     Status: None   Collection Time: 07/07/18 10:38 AM  Result Value Ref Range Status   SARS Coronavirus 2 NEGATIVE NEGATIVE Final    Comment: (NOTE) If result is NEGATIVE SARS-CoV-2 target nucleic acids are NOT DETECTED. The SARS-CoV-2 RNA is generally detectable in upper and lower  respiratory specimens during the acute phase of infection. The lowest  concentration of SARS-CoV-2 viral copies this assay can detect is 250  copies / mL. A negative result does not preclude SARS-CoV-2 infection  and should not be used as the sole basis for treatment or other  patient management decisions.  A negative result may occur with  improper specimen collection /  handling, submission of specimen other  than nasopharyngeal swab, presence of viral mutation(s) within the  areas targeted by this assay, and inadequate number of viral copies  (<250 copies / mL). A negative result must be combined with clinical  observations, patient history, and epidemiological information. If result is POSITIVE SARS-CoV-2 target nucleic acids are DETECTED. The SARS-CoV-2 RNA is generally detectable in upper and lower  respiratory specimens dur ing the acute phase of infection.  Positive  results are indicative of  active infection with SARS-CoV-2.  Clinical  correlation with patient history and other diagnostic information is  necessary to determine patient infection status.  Positive results do  not rule out bacterial infection or co-infection with other viruses. If result is PRESUMPTIVE POSTIVE SARS-CoV-2 nucleic acids MAY BE PRESENT.   A presumptive positive result was obtained on the submitted specimen  and confirmed on repeat testing.  While 2019 novel coronavirus  (SARS-CoV-2) nucleic acids may be present in the submitted sample  additional confirmatory testing may be necessary for epidemiological  and / or clinical management purposes  to differentiate between  SARS-CoV-2 and other Sarbecovirus currently known to infect humans.  If clinically indicated additional testing with an alternate test  methodology [phone]) is advised. The SARS-CoV-2 RNA is generally  detectable in upper and lower respiratory sp ecimens during the acute  phase of infection. The expected result is Negative. Fact Sheet for Patients:  BoilerBrush.com.cy Fact Sheet for Healthcare Providers: https://pope.com/ This test is not yet approved or cleared by the [place] FDA and has been authorized for detection and/or diagnosis of SARS-CoV-2 by FDA under an Emergency Use Authorization (EUA).  This EUA will remain in effect (meaning this  test can be used) for the duration of the COVID-19 declaration under Section 564(b)(1) of the Act, 21 U.S.C. section 360bbb-3(b)(1), unless the authorization is terminated or revoked sooner. Performed at [facility], [address] [place], [place] [address]   Culture, blood (single) w Reflex to ID Panel     Status: None (Preliminary result)   Collection Time: 07/07/18  5:21 PM  Result Value Ref Range Status   Specimen Description BLOOD RIGHT HAND  Final   Special Requests   Final    BOTTLES DRAWN AEROBIC AND ANAEROBIC Blood Culture adequate volume   Culture  Setup Time   Final    GRAM POSITIVE COCCI IN CLUSTERS IN BOTH AEROBIC AND ANAEROBIC BOTTLES CRITICAL RESULT CALLED TO, READ BACK BY AND VERIFIED WITH: KMarcello Moores 2130 07/09/2018 Girtha Hake Performed at [facility], [address], [place], [place] [address]    Culture GRAM POSITIVE COCCI  Final   Report Status PENDING  Incomplete  Blood Culture ID Panel (Reflexed)     Status: Abnormal   Collection Time: 07/07/18  5:21 PM  Result Value Ref Range Status   Enterococcus species NOT DETECTED NOT DETECTED Final   Listeria monocytogenes NOT DETECTED NOT DETECTED Final   Staphylococcus species DETECTED (A) NOT DETECTED Final    Comment: Methicillin (oxacillin) susceptible coagulase negative staphylococcus. Possible blood culture contaminant (unless isolated from more than one blood culture draw or clinical case suggests pathogenicity). No antibiotic treatment is indicated for blood  culture contaminants. CRITICAL RESULT CALLED TO, READ BACK BY AND VERIFIED WITH: K. HURTH,PHARMD 0015 07/09/2018 T. TYSOR    Staphylococcus aureus (BCID) NOT DETECTED NOT DETECTED Final   Methicillin resistance NOT DETECTED NOT DETECTED Final   Streptococcus species NOT DETECTED NOT DETECTED Final   Streptococcus agalactiae NOT DETECTED NOT DETECTED Final   Streptococcus pneumoniae NOT DETECTED NOT DETECTED Final   Streptococcus pyogenes NOT  DETECTED NOT DETECTED Final   Acinetobacter baumannii NOT DETECTED NOT DETECTED Final   Enterobacteriaceae species NOT DETECTED NOT DETECTED Final   Enterobacter cloacae complex NOT DETECTED NOT DETECTED Final   Escherichia coli NOT DETECTED NOT DETECTED Final   Klebsiella oxytoca NOT DETECTED NOT DETECTED Final   Klebsiella pneumoniae NOT DETECTED NOT DETECTED Final   Proteus species NOT DETECTED  NOT DETECTED Final   Serratia marcescens NOT DETECTED NOT DETECTED Final   Haemophilus influenzae NOT DETECTED NOT DETECTED Final   Neisseria meningitidis NOT DETECTED NOT DETECTED Final   Pseudomonas aeruginosa NOT DETECTED NOT DETECTED Final   Candida albicans NOT DETECTED NOT DETECTED Final   Candida glabrata NOT DETECTED NOT DETECTED Final   Candida krusei NOT DETECTED NOT DETECTED Final   Candida parapsilosis NOT DETECTED NOT DETECTED Final   Candida tropicalis NOT DETECTED NOT DETECTED Final    Comment: Performed at [facility], [address], [place], [place] [address]  Aerobic/Anaerobic Culture (surgical/deep wound)     Status: None (Preliminary result)   Collection Time: 07/08/18  4:41 PM  Result Value Ref Range Status   Specimen Description ABSCESS  Final   Special Requests LEFT ARM  Final   Gram Stain   Final    ABUNDANT WBC PRESENT, PREDOMINANTLY PMN MODERATE GRAM NEGATIVE RODS MODERATE GRAM POSITIVE COCCI Performed at [facility], [address], [place], [place] [address]    Culture PENDING  Incomplete   Report Status PENDING  Incomplete    Cliffton Asters, MD [facility] [phone] pager   [phone] cell 07/09/2018, 10:32 AM

## 2018-07-09 NOTE — Progress Notes (Signed)
Pt continues to decline dressing change without IV pain medicine. Pt insisting on leaving later today, but "is not ready" right now. Informed pt we can not insert an IV just to give him medicine and take it right back out so he can leave. Pt states he "doesn't understand why we can't just give him the medicine". Pt given PO pain medicine. MD paged. Will continue to monitor.  Reynold Bowen, RN BSN 07/09/2018 1:41 PM

## 2018-07-09 NOTE — Progress Notes (Signed)
Pt given oral and written discharge instructions and written prescription. Pt stating he is not ready to "discharge myself" at this time. States he has a car located at this facility. Wants IV medications and informed we are unable to administer IV meds with no IV access. Pt wants to stay in hospital if he can have his IV access back. Pt lying in bed on his side. Voice fades lower when speaking and unable to keep eyes open when addressing this Clinical research associate. Faces pain scale is zero. Informed of PO meds available per current orders.

## 2018-07-09 NOTE — Progress Notes (Signed)
I escorted pt to emergency room entrance for discharge. Pt complaining of arm pain. RN had given medication prior to discharge. I explained that he could alternate tylenol and/or motrin for pain. He he stated " that sh** don't work for me, I'll just buy some oxy." I explained that we could not make his decisions. Pt was not interested in hearing any instructions or recommendations. Thomas Hoff, RN

## 2018-07-09 NOTE — Progress Notes (Signed)
PHARMACY - PHYSICIAN COMMUNICATION CRITICAL VALUE ALERT - BLOOD CULTURE IDENTIFICATION (BCID)  Timothy Christensen is an 23 y.o. male who presented to [facility] on 07/07/2018 with a chief complaint of L elbow abscess.  Assessment:  Has hx of IV drug abuse; started on vancomycin/micafungin at [facility] prior to transfer. CT scan showing soft tissue abscess without joint involvement - ID involved. 5/21 BCx >> 1/2 Candida Albicans so on fluconazole alone now.   BCID showing 1/2 bottles with coag neg staph (anaerobic bottle only).  Name of physician (or Provider) Contacted: X. Blount  Current regimen: Fluconazole 400 mg IV every 24 hours  Changes to prescribed antibiotics recommended:  Continue on current antifungal agent as per ID recommendations - 1/2 bottles possibly a contaminent.   Results for orders placed or performed during the hospital encounter of 07/07/18  Blood Culture ID Panel (Reflexed) (Collected: 07/07/2018  5:21 PM)  Result Value Ref Range   Enterococcus species NOT DETECTED NOT DETECTED   Listeria monocytogenes NOT DETECTED NOT DETECTED   Staphylococcus species DETECTED (A) NOT DETECTED   Staphylococcus aureus (BCID) NOT DETECTED NOT DETECTED   Methicillin resistance NOT DETECTED NOT DETECTED   Streptococcus species NOT DETECTED NOT DETECTED   Streptococcus agalactiae NOT DETECTED NOT DETECTED   Streptococcus pneumoniae NOT DETECTED NOT DETECTED   Streptococcus pyogenes NOT DETECTED NOT DETECTED   Acinetobacter baumannii NOT DETECTED NOT DETECTED   Enterobacteriaceae species NOT DETECTED NOT DETECTED   Enterobacter cloacae complex NOT DETECTED NOT DETECTED   Escherichia coli NOT DETECTED NOT DETECTED   Klebsiella oxytoca NOT DETECTED NOT DETECTED   Klebsiella pneumoniae NOT DETECTED NOT DETECTED   Proteus species NOT DETECTED NOT DETECTED   Serratia marcescens NOT DETECTED NOT DETECTED   Haemophilus influenzae NOT DETECTED NOT DETECTED   Neisseria meningitidis NOT DETECTED  NOT DETECTED   Pseudomonas aeruginosa NOT DETECTED NOT DETECTED   Candida albicans NOT DETECTED NOT DETECTED   Candida glabrata NOT DETECTED NOT DETECTED   Candida krusei NOT DETECTED NOT DETECTED   Candida parapsilosis NOT DETECTED NOT DETECTED   Candida tropicalis NOT DETECTED NOT DETECTED   Sherron Monday, PharmD, BCCCP Clinical Pharmacist  Pager: [phone] Phone: [phone] 07/09/2018  12:20 AM

## 2018-07-09 NOTE — Progress Notes (Signed)
Offered to change pts dressing. Pt declining stating that it will hurt too much and needs stronger pain medicine before he will let it be changed. Pt had RN remove IV previously, and does not want to be stuck for a new one. Pt just received Oxy, and pt Informed pt there is not any stronger pain medicine to give pt. Pt continues to decline dressing change. Will re-attempt later.  Reynold Bowen, RN BSN 07/09/2018 09:45 AM

## 2018-07-09 NOTE — Discharge Summary (Signed)
Physician Discharge Summary  Timothy Christensen IHK:[id number] DOB: 08/19/1995 DOA: 07/07/2018  PCP: Patient, No Pcp Per  Admit date: 07/07/2018 Discharge date: 07/09/2018  Admitted From: Home  Discharge disposition: Home   Recommendations for Outpatient Follow-Up:    Follow up with your primary care provider in one week.  Complete the course of antibiotics.  Discharge Diagnosis:   Principal Problem:   Cellulitis and abscess of upper extremity Active Problems:   IVDU (intravenous drug user)/Opiates/Cocaine   Noncompliance   Cocaine abuse (HCC)   Sepsis (HCC)  Discharge Condition: Improved.  Diet recommendation:   Regular.  Wound care: Daily dressing  Code status: Full.   History of Present Illness:   JalenIsleyis a23 y.o.male,with past medical history significant for IV drug abuse presenting for evaluation of left elbow abscess. His last IV drug abuse was 3 weeks ago and this problem started around 10 days ago. Patient was at [facility] and he was started on vancomycin and micafungin after blood culture came back positive for yeast. ID consulted on the patient. Patient is noncompliant and left AMA x2 times.   Hospital Course:   Patient was admitted to hospital and following conditions were addressed during hospitalization,  Acute Cellulitis/left elbow abscess leading to sepsis, present on admission. CT scan of the elbow with IV contrast showed superficial peripherally enhancing fluid collection in the antecubital fossa consistent with abscess.  There was no evidence of septic joint or osteomyelitis. Patient underwent Incision and drainage of the left elbow abscess On 07/08/2018 by orthopedics  Patient received broad-spectrum antibiotics with IV vancomycin and Eraxis as per the blood culture report from previous.  Patient had yeast on blood culture from 5/ 21.    Patient was then seen by infectious disease during hospitalization.  Repeat blood culture without  fungus.  Patient insisted on leaving AGAINST MEDICAL ADVICE so ID has recommended 2 weeks of Augmentin and fluconazole on discharge.  I have spoken with the case management to help the patient assist with antibiotics.    Candida albicans fungemia.  Infectious disease on board.  Recommending fluconazole for 14 days.  Repeat blood cultures negative for Candida.  Polysubstance substance abuse, IV drug abuse.  History of cocaine abuse as well.    He received IV narcotics for pain and did not have any withdrawal symptoms.  Medical noncompliance.  Patient was emphasized the need for compliance with medications.  Disposition.  Patient strongly wishes to discharge home and threatens of leaving AMA if not discharged. He stated that his grandmother died today and has to leave. He was advised to complete the course of antibiotic and seek medical attention for worsening symptoms  Medical Consultants:    Infectious disease,Orthopedics   Subjective:   Today, patient complains of arm pain, states that he needs to go home today.  Discharge Exam:   Vitals:   07/09/18 0808 07/09/18 1204  BP: (!) 137/97 119/78  Pulse:  (!) 50  Resp: (!) 23 20  Temp:  98 F (36.7 C)  SpO2:  98%   Vitals:   07/08/18 2356 07/09/18 0550 07/09/18 0808 07/09/18 1204  BP: 131/83 116/71 (!) 137/97 119/78  Pulse: (!) 54   (!) 50  Resp: 15 20 (!) 23 20  Temp: 98.1 F (36.7 C) 98.4 F (36.9 C)  98 F (36.7 C)  TempSrc: Oral Oral  Oral  SpO2: 96% 99%  98%  Weight:      Height:        General  exam: Appears calm and comfortable ,Not in distress HEENT:PERRL,Oral mucosa moist Respiratory system: Bilateral equal air entry, normal vesicular breath sounds, no wheezes or crackles  Cardiovascular system: S1 & S2 heard, RRR.  Gastrointestinal system: Abdomen is nondistended, soft and nontender. No organomegaly or masses felt. Normal bowel sounds heard. Central nervous system: Alert and oriented. No focal neurological  deficits. Extremities: No edema, no clubbing ,no cyanosis, distal peripheral pulses palpable. Skin: No rashes, lesions or ulcers,no icterus ,no pallor MSK: Normal muscle bulk,tone ,power.  Left upper extremity Ace wrap   Procedures:    Incision and drainage of the left elbow abscess On 07/08/2018 by orthopedics  The results of significant diagnostics from this hospitalization (including imaging, microbiology, ancillary and laboratory) are listed below for reference.     Diagnostic Studies:   Dg Elbow Complete Left  Result Date: 07/07/2018 CLINICAL DATA:  Medial soft tissue swelling.  Rule out abscess. EXAM: LEFT ELBOW - COMPLETE 3+ VIEW COMPARISON:  None. FINDINGS: Normal alignment. No fracture or arthropathy. Joint spaces normal. Negative for osteomyelitis. No foreign body in the soft tissues. No gas in the soft tissues. Soft tissue swelling proximal medial forearm. IMPRESSION: Soft tissue swelling otherwise negative Electronically Signed   By: Marlan Palau M.D.   On: 07/07/2018 07:25   Ct Elbow Left W Contrast  Result Date: 07/08/2018 CLINICAL DATA:  Abscess in the antecubital fossa. Intravenous drug abuse. EXAM: CT OF THE UPPER LEFT EXTREMITY WITH CONTRAST TECHNIQUE: Multidetector CT imaging of the left elbow and proximal forearm was performed according to the standard protocol following intravenous contrast administration. COMPARISON:  Elbow radiographs 07/07/2018. CONTRAST:  OMNIPAQUE IOHEXOL 300 MG/ML  SOLN FINDINGS: Bones/Joint/Cartilage No evidence of acute fracture, dislocation or significant arthropathy. There is no bone destruction or elbow joint effusion. Ligaments Suboptimally assessed by CT. Muscles and Tendons No intramuscular fluid collections are identified. Superficial abscess in the antecubital fossa is located just medial to the musculotendinous junction of the biceps tendon. The biceps tendon appears normal. Soft tissues Superficial, peripherally enhancing fluid  collection medially in the antecubital fossa measures 4.1 x 3.1 x 3.5 cm and is consistent with an abscess. There is no foreign body. This fluid collection is associated with extrinsic narrowing of the basilic vein which may indicate superficial thrombophlebitis. IMPRESSION: 1. Superficial, peripherally enhancing fluid collection antecubital fossa consistent with an abscess. 2. Possible associated superficial thrombophlebitis of the basilic vein. 3. No evidence of septic joint or osteomyelitis. Electronically Signed   By: Carey Bullocks M.D.   On: 07/08/2018 11:55     Labs:   Basic Metabolic Panel: Recent Labs  Lab 07/05/18 2024 07/06/18 0535 07/07/18 0555 07/07/18 1721 07/08/18 0703  NA 135 141 141 136 140  K 3.4* 3.9 3.4* 3.9 3.8  CL 100 110 106 106 110  CO2 GLUCOSE 125* 103* 103* 110* 117*  BUN 9 8 5* <5* 7  CREATININE 0.91 0.74 0.70 0.82 0.80  CALCIUM 8.8* 8.2* 8.8* 8.7* 8.3*   GFR Estimated Creatinine Clearance: 137.4 mL/min (by C-G formula based on SCr of 0.8 mg/dL). Liver Function Tests: Recent Labs  Lab 07/05/18 2024 07/07/18 0555 07/07/18 1721  AST ALT ALKPHOS 78 68 74  BILITOT 0.3 0.4 0.3  PROT 7.1 6.5 6.4*  ALBUMIN 3.9 3.6 3.5   No results for input(s): LIPASE, AMYLASE in the last 168 hours. No results for input(s): AMMONIA in the last 168 hours. Coagulation  profile Recent Labs  Lab 07/05/18 2024  INR 1.1    CBC: Recent Labs  Lab 07/05/18 2024 07/06/18 0535 07/07/18 0555 07/07/18 1721 07/08/18 0703  WBC 4.7 3.7* 5.8 5.0 5.7  NEUTROABS 4.1  --  4.2  --   --   HGB 14.3 13.5 13.0 13.3 11.7*  HCT 44.6 42.4 39.4 41.2 36.4*  MCV 85.4 87.1 85.1 85.7 85.4  PLT 179 156 194 189 184   Cardiac Enzymes: No results for input(s): CKTOTAL, CKMB, CKMBINDEX, TROPONINI in the last 168 hours. BNP: Invalid input(s): POCBNP CBG: Recent Labs  Lab 07/06/18 0716  GLUCAP 125*   D-Dimer No results for input(s): DDIMER in  the last 72 hours. Hgb A1c No results for input(s): HGBA1C in the last 72 hours. Lipid Profile No results for input(s): CHOL, HDL, LDLCALC, TRIG, CHOLHDL, LDLDIRECT in the last 72 hours. Thyroid function studies No results for input(s): TSH, T4TOTAL, T3FREE, THYROIDAB in the last 72 hours.  Invalid input(s): FREET3 Anemia work up No results for input(s): VITAMINB12, FOLATE, FERRITIN, TIBC, IRON, RETICCTPCT in the last 72 hours. Microbiology Recent Results (from the past 240 hour(s))  Culture, blood (Routine x 2)     Status: Abnormal   Collection Time: 07/05/18  8:24 PM  Result Value Ref Range Status   Specimen Description   Final    BLOOD RIGHT FOREARM Performed at [facility], [address], [place], [place] [address]    Special Requests   Final    BOTTLES DRAWN AEROBIC AND ANAEROBIC Blood Culture adequate volume Performed at [facility], [address], [place], [place] [address]    Culture  Setup Time   Final    YEAST IN BOTH AEROBIC AND ANAEROBIC BOTTLES Gram Stain Report Called to,Read Back By and Verified With: WHITE,M@[id number]  BY MATTHEWS, B 5.23.2020 CRITICAL RESULT CALLED TO, READ BACK BY AND VERIFIED WITH: T. MEYER, PHARMD (APH) AT 1325 ON 07/07/18 BY C. JESSUP, MLT. Performed at [facility], [address], [place], [place] [address]    Culture CANDIDA ALBICANS (A)  Final   Report Status 07/09/2018 FINAL  Final  Blood Culture ID Panel (Reflexed)     Status: Abnormal   Collection Time: 07/05/18  8:24 PM  Result Value Ref Range Status   Enterococcus species NOT DETECTED NOT DETECTED Final   Listeria monocytogenes NOT DETECTED NOT DETECTED Final   Staphylococcus species NOT DETECTED NOT DETECTED Final   Staphylococcus aureus (BCID) NOT DETECTED NOT DETECTED Final   Streptococcus species NOT DETECTED NOT DETECTED Final   Streptococcus agalactiae NOT DETECTED NOT DETECTED Final   Streptococcus pneumoniae NOT DETECTED NOT DETECTED Final   Streptococcus pyogenes NOT  DETECTED NOT DETECTED Final   Acinetobacter baumannii NOT DETECTED NOT DETECTED Final   Enterobacteriaceae species NOT DETECTED NOT DETECTED Final   Enterobacter cloacae complex NOT DETECTED NOT DETECTED Final   Escherichia coli NOT DETECTED NOT DETECTED Final   Klebsiella oxytoca NOT DETECTED NOT DETECTED Final   Klebsiella pneumoniae NOT DETECTED NOT DETECTED Final   Proteus species NOT DETECTED NOT DETECTED Final   Serratia marcescens NOT DETECTED NOT DETECTED Final   Haemophilus influenzae NOT DETECTED NOT DETECTED Final   Neisseria meningitidis NOT DETECTED NOT DETECTED Final   Pseudomonas aeruginosa NOT DETECTED NOT DETECTED Final   Candida albicans DETECTED (A) NOT DETECTED Final    Comment: CRITICAL RESULT CALLED TO, READ BACK BY AND VERIFIED WITH: T. MEYER, PHARMD (APH) AT 1325 ON 07/07/18 BY C. JESSUP, MLT.  Candida glabrata NOT DETECTED NOT DETECTED Final   Candida krusei NOT DETECTED NOT DETECTED Final   Candida parapsilosis NOT DETECTED NOT DETECTED Final   Candida tropicalis NOT DETECTED NOT DETECTED Final    Comment: Performed at [facility], [address], [place], [place] [address]  Culture, blood (Routine x 2)     Status: None (Preliminary result)   Collection Time: 07/05/18  8:29 PM  Result Value Ref Range Status   Specimen Description BLOOD  Final   Special Requests NONE  Final   Culture   Final    NO GROWTH 4 DAYS Performed at [facility], [address] [place], [place] [address]    Report Status PENDING  Incomplete  SARS Coronavirus 2 (CEPHEID - Performed in [facility] hospital lab), Hosp Order     Status: None   Collection Time: 07/05/18  9:01 PM  Result Value Ref Range Status   SARS Coronavirus 2 NEGATIVE NEGATIVE Final    Comment: (NOTE) If result is NEGATIVE SARS-CoV-2 target nucleic acids are NOT DETECTED. The SARS-CoV-2 RNA is generally detectable in upper and lower  respiratory specimens during the acute phase of infection. The lowest   concentration of SARS-CoV-2 viral copies this assay can detect is 250  copies / mL. A negative result does not preclude SARS-CoV-2 infection  and should not be used as the sole basis for treatment or other  patient management decisions.  A negative result may occur with  improper specimen collection / handling, submission of specimen other  than nasopharyngeal swab, presence of viral mutation(s) within the  areas targeted by this assay, and inadequate number of viral copies  (<250 copies / mL). A negative result must be combined with clinical  observations, patient history, and epidemiological information. If result is POSITIVE SARS-CoV-2 target nucleic acids are DETECTED. The SARS-CoV-2 RNA is generally detectable in upper and lower  respiratory specimens dur ing the acute phase of infection.  Positive  results are indicative of active infection with SARS-CoV-2.  Clinical  correlation with patient history and other diagnostic information is  necessary to determine patient infection status.  Positive results do  not rule out bacterial infection or co-infection with other viruses. If result is PRESUMPTIVE POSTIVE SARS-CoV-2 nucleic acids MAY BE PRESENT.   A presumptive positive result was obtained on the submitted specimen  and confirmed on repeat testing.  While 2019 novel coronavirus  (SARS-CoV-2) nucleic acids may be present in the submitted sample  additional confirmatory testing may be necessary for epidemiological  and / or clinical management purposes  to differentiate between  SARS-CoV-2 and other Sarbecovirus currently known to infect humans.  If clinically indicated additional testing with an alternate test  methodology [phone]) is advised. The SARS-CoV-2 RNA is generally  detectable in upper and lower respiratory sp ecimens during the acute  phase of infection. The expected result is Negative. Fact Sheet for Patients:  BoilerBrush.com.cy Fact Sheet  for Healthcare Providers: https://pope.com/ This test is not yet approved or cleared by the [place] FDA and has been authorized for detection and/or diagnosis of SARS-CoV-2 by FDA under an Emergency Use Authorization (EUA).  This EUA will remain in effect (meaning this test can be used) for the duration of the COVID-19 declaration under Section 564(b)(1) of the Act, 21 U.S.C. section 360bbb-3(b)(1), unless the authorization is terminated or revoked sooner. Performed at [facility], [address] [place], [place] [address]   SARS Coronavirus 2 (CEPHEID - Performed in [facility]  Health hospital lab), Hosp Order     Status: None   Collection Time: 07/07/18 10:38 AM  Result Value Ref Range Status   SARS Coronavirus 2 NEGATIVE NEGATIVE Final    Comment: (NOTE) If result is NEGATIVE SARS-CoV-2 target nucleic acids are NOT DETECTED. The SARS-CoV-2 RNA is generally detectable in upper and lower  respiratory specimens during the acute phase of infection. The lowest  concentration of SARS-CoV-2 viral copies this assay can detect is 250  copies / mL. A negative result does not preclude SARS-CoV-2 infection  and should not be used as the sole basis for treatment or other  patient management decisions.  A negative result may occur with  improper specimen collection / handling, submission of specimen other  than nasopharyngeal swab, presence of viral mutation(s) within the  areas targeted by this assay, and inadequate number of viral copies  (<250 copies / mL). A negative result must be combined with clinical  observations, patient history, and epidemiological information. If result is POSITIVE SARS-CoV-2 target nucleic acids are DETECTED. The SARS-CoV-2 RNA is generally detectable in upper and lower  respiratory specimens dur ing the acute phase of infection.  Positive  results are indicative of active infection with SARS-CoV-2.  Clinical  correlation with patient  history and other diagnostic information is  necessary to determine patient infection status.  Positive results do  not rule out bacterial infection or co-infection with other viruses. If result is PRESUMPTIVE POSTIVE SARS-CoV-2 nucleic acids MAY BE PRESENT.   A presumptive positive result was obtained on the submitted specimen  and confirmed on repeat testing.  While 2019 novel coronavirus  (SARS-CoV-2) nucleic acids may be present in the submitted sample  additional confirmatory testing may be necessary for epidemiological  and / or clinical management purposes  to differentiate between  SARS-CoV-2 and other Sarbecovirus currently known to infect humans.  If clinically indicated additional testing with an alternate test  methodology [phone]) is advised. The SARS-CoV-2 RNA is generally  detectable in upper and lower respiratory sp ecimens during the acute  phase of infection. The expected result is Negative. Fact Sheet for Patients:  BoilerBrush.com.cy Fact Sheet for Healthcare Providers: https://pope.com/ This test is not yet approved or cleared by the [place] FDA and has been authorized for detection and/or diagnosis of SARS-CoV-2 by FDA under an Emergency Use Authorization (EUA).  This EUA will remain in effect (meaning this test can be used) for the duration of the COVID-19 declaration under Section 564(b)(1) of the Act, 21 U.S.C. section 360bbb-3(b)(1), unless the authorization is terminated or revoked sooner. Performed at [facility], [address] [place], [place] [address]   Culture, blood (single) w Reflex to ID Panel     Status: Abnormal (Preliminary result)   Collection Time: 07/07/18  5:21 PM  Result Value Ref Range Status   Specimen Description BLOOD RIGHT HAND  Final   Special Requests   Final    BOTTLES DRAWN AEROBIC AND ANAEROBIC Blood Culture adequate volume   Culture  Setup Time   Final    GRAM POSITIVE  COCCI IN CLUSTERS IN BOTH AEROBIC AND ANAEROBIC BOTTLES CRITICAL RESULT CALLED TO, READ BACK BY AND VERIFIED WITH: KMarcello Moores [id number] 07/09/2018 Girtha Hake Performed at [facility], [address], [place], [place] [address]    Culture STAPHYLOCOCCUS SPECIES (COAGULASE NEGATIVE) (A)  Final   Report Status PENDING  Incomplete  Blood Culture ID Panel (Reflexed)     Status: Abnormal   Collection  Time: 07/07/18  5:21 PM  Result Value Ref Range Status   Enterococcus species NOT DETECTED NOT DETECTED Final   Listeria monocytogenes NOT DETECTED NOT DETECTED Final   Staphylococcus species DETECTED (A) NOT DETECTED Final    Comment: Methicillin (oxacillin) susceptible coagulase negative staphylococcus. Possible blood culture contaminant (unless isolated from more than one blood culture draw or clinical case suggests pathogenicity). No antibiotic treatment is indicated for blood  culture contaminants. CRITICAL RESULT CALLED TO, READ BACK BY AND VERIFIED WITH: K. HURTH,PHARMD 0015 07/09/2018 T. TYSOR    Staphylococcus aureus (BCID) NOT DETECTED NOT DETECTED Final   Methicillin resistance NOT DETECTED NOT DETECTED Final   Streptococcus species NOT DETECTED NOT DETECTED Final   Streptococcus agalactiae NOT DETECTED NOT DETECTED Final   Streptococcus pneumoniae NOT DETECTED NOT DETECTED Final   Streptococcus pyogenes NOT DETECTED NOT DETECTED Final   Acinetobacter baumannii NOT DETECTED NOT DETECTED Final   Enterobacteriaceae species NOT DETECTED NOT DETECTED Final   Enterobacter cloacae complex NOT DETECTED NOT DETECTED Final   Escherichia coli NOT DETECTED NOT DETECTED Final   Klebsiella oxytoca NOT DETECTED NOT DETECTED Final   Klebsiella pneumoniae NOT DETECTED NOT DETECTED Final   Proteus species NOT DETECTED NOT DETECTED Final   Serratia marcescens NOT DETECTED NOT DETECTED Final   Haemophilus influenzae NOT DETECTED NOT DETECTED Final   Neisseria meningitidis NOT DETECTED NOT  DETECTED Final   Pseudomonas aeruginosa NOT DETECTED NOT DETECTED Final   Candida albicans NOT DETECTED NOT DETECTED Final   Candida glabrata NOT DETECTED NOT DETECTED Final   Candida krusei NOT DETECTED NOT DETECTED Final   Candida parapsilosis NOT DETECTED NOT DETECTED Final   Candida tropicalis NOT DETECTED NOT DETECTED Final    Comment: Performed at [facility], [address] [place], [place] [address]  Aerobic/Anaerobic Culture (surgical/deep wound)     Status: None (Preliminary result)   Collection Time: 07/08/18  4:41 PM  Result Value Ref Range Status   Specimen Description ABSCESS  Final   Special Requests LEFT ARM  Final   Gram Stain   Final    ABUNDANT WBC PRESENT, PREDOMINANTLY PMN MODERATE GRAM NEGATIVE RODS MODERATE GRAM POSITIVE COCCI    Culture   Final    CULTURE REINCUBATED FOR BETTER GROWTH Performed at [facility], [address] [place], [place] [address]    Report Status PENDING  Incomplete     Discharge Instructions:   Discharge Instructions    Diet - low sodium heart healthy   Complete by:  As directed    Discharge instructions   Complete by:  As directed    Dry Dressing change every other day. Follow up with primary care clinic in 1 week. Complete the course of antibiotics for 2 weeks.   Increase activity slowly   Complete by:  As directed      Allergies as of 07/09/2018   No Known Allergies     Medication List    STOP taking these medications   doxycycline 100 MG capsule Commonly known as:  Vibramycin     TAKE these medications   amoxicillin-clavulanate 875-125 MG tablet Commonly known as:  Augmentin Take 1 tablet by mouth 2 (two) times daily for 14 days.   fluconazole 200 MG tablet Commonly known as:  Diflucan Take 2 tablets (400 mg total) by mouth daily for 14 days.         Time coordinating discharge: 39 minutes  Signed:  Arden Axon  [facility]  07/09/2018, 2:18 PM

## 2018-07-10 ENCOUNTER — Encounter (HOSPITAL_COMMUNITY): Payer: Self-pay | Admitting: Orthopaedic Surgery

## 2018-07-10 LAB — CULTURE, BLOOD (SINGLE): Special Requests: ADEQUATE

## 2018-07-10 LAB — CULTURE, BLOOD (ROUTINE X 2): Culture: NO GROWTH

## 2018-07-14 LAB — AEROBIC/ANAEROBIC CULTURE W GRAM STAIN (SURGICAL/DEEP WOUND)

## 2018-07-17 NOTE — Anesthesia Postprocedure Evaluation (Signed)
Anesthesia Post Note  Patient: Timothy Christensen  Procedure(s) Performed: IRRIGATION AND DEBRIDEMENT HUMERUS (Left Arm Upper)     Patient location during evaluation: PACU Anesthesia Type: General Level of consciousness: awake and alert Pain management: pain level controlled Vital Signs Assessment: post-procedure vital signs reviewed and stable Respiratory status: spontaneous breathing, nonlabored ventilation, respiratory function stable and patient connected to nasal cannula oxygen Cardiovascular status: blood pressure returned to baseline and stable Postop Assessment: no apparent nausea or vomiting Anesthetic complications: no    Last Vitals:  Vitals:   07/09/18 0808 07/09/18 1204  BP: (!) 137/97 119/78  Pulse:  (!) 50  Resp: (!) 23 20  Temp:  36.7 C  SpO2:  98%    Last Pain:  Vitals:   07/09/18 1609  TempSrc:   PainSc: 7                  Eufelia Veno

## 2018-11-15 ENCOUNTER — Other Ambulatory Visit: Payer: Self-pay

## 2018-11-15 ENCOUNTER — Emergency Department (HOSPITAL_COMMUNITY): Payer: Self-pay

## 2018-11-15 ENCOUNTER — Inpatient Hospital Stay (HOSPITAL_COMMUNITY)
Admission: EM | Admit: 2018-11-15 | Discharge: 2018-11-15 | DRG: 195 | Discharge status: Against Medical Advice | Payer: Self-pay | Attending: Internal Medicine | Admitting: Internal Medicine

## 2018-11-15 DIAGNOSIS — F17210 Nicotine dependence, cigarettes, uncomplicated: Secondary | ICD-10-CM | POA: Diagnosis present

## 2018-11-15 DIAGNOSIS — R0682 Tachypnea, not elsewhere classified: Secondary | ICD-10-CM

## 2018-11-15 DIAGNOSIS — F1290 Cannabis use, unspecified, uncomplicated: Secondary | ICD-10-CM | POA: Diagnosis present

## 2018-11-15 DIAGNOSIS — R0981 Nasal congestion: Secondary | ICD-10-CM | POA: Diagnosis present

## 2018-11-15 DIAGNOSIS — J189 Pneumonia, unspecified organism: Principal | ICD-10-CM | POA: Diagnosis present

## 2018-11-15 DIAGNOSIS — Z20828 Contact with and (suspected) exposure to other viral communicable diseases: Secondary | ICD-10-CM | POA: Diagnosis present

## 2018-11-15 DIAGNOSIS — F1410 Cocaine abuse, uncomplicated: Secondary | ICD-10-CM | POA: Diagnosis present

## 2018-11-15 LAB — TROPONIN I (HIGH SENSITIVITY)
Troponin I (High Sensitivity): 27 ng/L — ABNORMAL HIGH (ref <18)
Troponin I (High Sensitivity): 27 ng/L — ABNORMAL HIGH (ref <18)

## 2018-11-15 LAB — CBC
HCT: 48.1 % (ref 39.0–52.0)
Hemoglobin: 16.3 g/dL (ref 13.0–17.0)
MCH: 29.1 pg (ref 26.0–34.0)
MCHC: 33.9 g/dL (ref 30.0–36.0)
MCV: 85.9 fL (ref 80.0–100.0)
Platelets: 114 10*3/uL — ABNORMAL LOW (ref 150–400)
RBC: 5.6 MIL/uL (ref 4.22–5.81)
RDW: 13.7 % (ref 11.5–15.5)
WBC: 11.8 10*3/uL — ABNORMAL HIGH (ref 4.0–10.5)
nRBC: 0 % (ref 0.0–0.2)

## 2018-11-15 LAB — BASIC METABOLIC PANEL
Anion gap: 11 (ref 5–15)
BUN: 22 mg/dL — ABNORMAL HIGH (ref 6–20)
CO2: 26 mmol/L (ref 22–32)
Calcium: 8.7 mg/dL — ABNORMAL LOW (ref 8.9–10.3)
Chloride: 97 mmol/L — ABNORMAL LOW (ref 98–111)
Creatinine, Ser: 1.36 mg/dL — ABNORMAL HIGH (ref 0.61–1.24)
GFR calc Af Amer: >60 mL/min (ref >60)
GFR calc non Af Amer: >60 mL/min (ref >60)
Glucose, Bld: 110 mg/dL — ABNORMAL HIGH (ref 70–99)
Potassium: 3.6 mmol/L (ref 3.5–5.1)
Sodium: 134 mmol/L — ABNORMAL LOW (ref 135–145)

## 2018-11-15 LAB — SARS CORONAVIRUS 2 BY RT PCR (HOSPITAL ORDER, PERFORMED IN ~~LOC~~ HOSPITAL LAB): SARS Coronavirus 2: NEGATIVE

## 2018-11-15 LAB — LACTIC ACID, PLASMA: Lactic Acid, Venous: 2.1 mmol/L (ref 0.5–1.9)

## 2018-11-15 MED ORDER — SODIUM CHLORIDE 0.9% FLUSH: 3.0000 mL | Freq: Once | INTRAVENOUS | Status: DC

## 2018-11-15 MED ORDER — ACETAMINOPHEN 650 MG RE SUPP: 650.0000 mg | Freq: Four times a day (QID) | RECTAL | Status: DC | PRN

## 2018-11-15 MED ORDER — SODIUM CHLORIDE 0.9 % IV BOLUS
1000.0000 mL | Freq: Once | INTRAVENOUS | Status: AC
  Administered 2018-11-15: 1000 mL via INTRAVENOUS

## 2018-11-15 MED ORDER — ACETAMINOPHEN 500 MG PO TABS
1000.0000 mg | ORAL_TABLET | Freq: Once | ORAL | Status: AC
  Administered 2018-11-15: 1000 mg via ORAL
  Filled 2018-11-15: qty 2

## 2018-11-15 MED ORDER — ONDANSETRON HCL 4 MG/2ML IJ SOLN
4.0000 mg | Freq: Once | INTRAMUSCULAR | Status: AC
  Administered 2018-11-15: 10:00:00 4 mg via INTRAVENOUS
  Filled 2018-11-15: qty 2

## 2018-11-15 MED ORDER — MORPHINE SULFATE (PF) 2 MG/ML IV SOLN
1.0000 mg | INTRAVENOUS | Status: DC | PRN
  Administered 2018-11-15: 1 mg via INTRAVENOUS
  Filled 2018-11-15: qty 1

## 2018-11-15 MED ORDER — SODIUM CHLORIDE 0.9 % IV SOLN
2.0000 g | INTRAVENOUS | Status: DC
  Administered 2018-11-15: 2 g via INTRAVENOUS
  Filled 2018-11-15: qty 20

## 2018-11-15 MED ORDER — SODIUM CHLORIDE 0.9 % IV SOLN
500.0000 mg | INTRAVENOUS | Status: DC
  Administered 2018-11-15: 500 mg via INTRAVENOUS
  Filled 2018-11-15: qty 500

## 2018-11-15 MED ORDER — IOHEXOL 350 MG/ML SOLN
100.0000 mL | Freq: Once | INTRAVENOUS | Status: AC | PRN
  Administered 2018-11-15: 100 mL via INTRAVENOUS

## 2018-11-15 MED ORDER — ACETAMINOPHEN 325 MG PO TABS
650.0000 mg | ORAL_TABLET | Freq: Four times a day (QID) | ORAL | Status: DC | PRN
  Administered 2018-11-15: 650 mg via ORAL
  Filled 2018-11-15: qty 2

## 2018-11-15 MED ORDER — MORPHINE SULFATE (PF) 4 MG/ML IV SOLN
4.0000 mg | Freq: Once | INTRAVENOUS | Status: AC
  Administered 2018-11-15: 4 mg via INTRAVENOUS
  Filled 2018-11-15: qty 1

## 2018-11-15 MED ORDER — SODIUM CHLORIDE 0.9 % IV BOLUS
1000.0000 mL | Freq: Once | INTRAVENOUS | Status: AC
  Administered 2018-11-15: 10:00:00 1000 mL via INTRAVENOUS

## 2018-11-15 MED ORDER — IBUPROFEN 400 MG PO TABS
600.0000 mg | ORAL_TABLET | Freq: Once | ORAL | Status: AC
  Administered 2018-11-15: 600 mg via ORAL
  Filled 2018-11-15: qty 1

## 2018-11-15 NOTE — ED Notes (Signed)
Dinner tray ordered.

## 2018-11-15 NOTE — ED Notes (Signed)
Patient states feels better and wants to go home now. Doctor notified.

## 2018-11-15 NOTE — ED Notes (Signed)
Multiple attempts for urine sample, pt states unable to void right now

## 2018-11-15 NOTE — ED Notes (Signed)
Admit doctor arrived to speak with patient and explain why he is being admitted to the hospital. Patient took out out IV and got dressed. Patient listening to doctor at bedside.

## 2018-11-15 NOTE — ED Notes (Signed)
Patient given a [place] sandwich meal, cheese, and chicken broth.

## 2018-11-15 NOTE — ED Notes (Signed)
I didn't have any success getting patient blood. 

## 2018-11-15 NOTE — ED Notes (Addendum)
Admitting rounding at bedside 

## 2018-11-15 NOTE — ED Notes (Signed)
Patient transported to CT 

## 2018-11-15 NOTE — ED Provider Notes (Signed)
[facility]   CSN: [id number] Arrival date & time: 11/15/18  0803     History   Chief Complaint Chief Complaint  Patient presents with  . Cough  . Pleurisy    HPI Willa RoughJalen Christensen is a 23 y.o. male.     23 y.o male with a  PMH Sepsis, IVDU, Cocaine abuse presents to the ED with a chief complaint of chest pain x 3 days. Patient describe a pressure on his chest, he states his pain is unbearable.  He also experiences some shortness of breath, reports the pain is exacerbated with taking a deep breath.  He is on a T-max of 10 2-1 03, has continued to take Tylenol and Aleve without improvement in symptoms.  He also endorses extreme fatigue, unable to taste or smell anything, headaches.  He also endorses rhinorrhea, along with a nasal congestion and a dry cough.  He reports taking 1 of his mom antibiotic pills for 1 day.  Patient does have a previous history of cocaine abuse, reports this is occasionally although the last time he used was yesterday as he states "I wanted to feel numb ".  He denies any present history of IV drug use, states "it has been a while.  Patient does report his shortness of breath and chest pain are exacerbated with ambulation along with palpation of his chest. No prior history of CAD, Blood clots or remarkable family history.     Chest pain unbearable x 3 days  Worst with breathing Body aches t max 102-103 Tylenol and aleve Headaches dont help   Extremely fatigue,, cant taste anyuthing [place] I was around people. Nasal congestion. abx x1 day   No PMH  Shortness of brath worse with breating, out of breath  Cocaine abuse --> last use yesterday.  No IVDU --> been a whiel   The history is provided by the patient.    No past medical history on file.  Patient Active Problem List   Diagnosis Date Noted  . Sepsis (HCC) 07/07/2018  . Noncompliance 07/06/2018  . Cocaine abuse  (HCC) 07/06/2018  . Abscess   . Cellulitis and abscess of upper extremity 07/05/2018  . IVDU (intravenous drug user)/Opiates/Cocaine 07/05/2018  . Polysubstance abuse /Cocaine and Opiates 07/05/2018  . Tobacco abuse 07/05/2018    Past Surgical History:  Procedure Laterality Date  . I&D EXTREMITY Left 07/08/2018   Procedure: IRRIGATION AND DEBRIDEMENT HUMERUS;  Surgeon: Bjorn PippinVarkey, Dax T, MD;  Location: [facility] OR;  Service: Orthopedics;  Laterality: Left;  . WRIST SURGERY          Home Medications    Prior to Admission medications   Not on File    Family History No family history on file.  Social History Social History   Tobacco Use  . Smoking status: Current Every Day Smoker    Packs/day: 0.50    Types: Cigarettes  . Smokeless tobacco: Never Used  Substance Use Topics  . Alcohol use: No  . Drug use: Yes    Types: Marijuana     Allergies   Patient has no known allergies.   Review of Systems Review of Systems  Constitutional: Positive for chills and fatigue. Negative for fever.  HENT: Negative for sinus pain.   Respiratory: Positive for cough and shortness of breath.   Cardiovascular: Positive for chest pain.  Gastrointestinal: Positive for nausea. Negative for blood in stool, diarrhea and vomiting.  Genitourinary:  Negative for flank pain.  Musculoskeletal: Positive for myalgias. Negative for back pain.  Skin: Negative for pallor and wound.  Neurological: Positive for headaches. Negative for light-headedness.     Physical Exam Updated Vital Signs BP 111/69   Pulse (!) 115   Temp 98.9 F (37.2 C) (Oral)   Resp (!) 29   Ht 6' (1.829 m)   Wt 72.6 kg   SpO2 100%   BMI 21.70 kg/m   Physical Exam Vitals signs and nursing note reviewed.  Constitutional:      Appearance: Normal appearance. He is ill-appearing and diaphoretic.  HENT:     Head: Normocephalic and atraumatic.     Right Ear: Tympanic membrane normal.     Left Ear: Tympanic membrane normal.      Nose: Nose normal.     Mouth/Throat:     Mouth: Mucous membranes are dry.  Eyes:     Pupils: Pupils are equal, round, and reactive to light.  Neck:     Musculoskeletal: Normal range of motion and neck supple.  Cardiovascular:     Rate and Rhythm: Normal rate.  Pulmonary:     Comments: Tachypnea, decreased breath sounds throughout. Chest:     Chest wall: Tenderness present.     Breasts: Breasts are symmetrical.       Comments: Tenderness to palpation along the left chest.  No crepitus. Neurological:     Mental Status: He is alert.      ED Treatments / Results  Labs (all labs ordered are listed, but only abnormal results are displayed) Labs Reviewed  BASIC METABOLIC PANEL - Abnormal; Notable for the following components:      Result Value   Sodium 134 (*)    Chloride 97 (*)    Glucose, Bld 110 (*)    BUN 22 (*)    Creatinine, Ser 1.36 (*)    Calcium 8.7 (*)    All other components within normal limits  CBC - Abnormal; Notable for the following components:   WBC 11.8 (*)    Platelets 114 (*)    All other components within normal limits  TROPONIN I (HIGH SENSITIVITY) - Abnormal; Notable for the following components:   Troponin I (High Sensitivity) 27 (*)    All other components within normal limits  TROPONIN I (HIGH SENSITIVITY) - Abnormal; Notable for the following components:   Troponin I (High Sensitivity) 27 (*)    All other components within normal limits  SARS CORONAVIRUS 2 (HOSPITAL ORDER, PERFORMED IN [facility])  RAPID URINE DRUG SCREEN, HOSP PERFORMED  LACTIC ACID, PLASMA  LACTIC ACID, PLASMA    EKG None  Radiology Dg Chest 2 View  Result Date: 11/15/2018 CLINICAL DATA:  Shortness of breath.  Cough. EXAM: CHEST - 2 VIEW COMPARISON:  07/05/2018. FINDINGS: Mediastinum and hilar structures normal. Heart size normal. Mild left suprahilar atelectasis/infiltrate. No pleural effusion or pneumothorax. No acute bony abnormality. IMPRESSION: Mild  left suprahilar atelectasis/infiltrate. Electronically Signed   By: Maisie Fus  Register   On: 11/15/2018 08:43   Ct Angio Chest Pe W And/or Wo Contrast  Result Date: 11/15/2018 CLINICAL DATA:  Shortness of breath EXAM: CT ANGIOGRAPHY CHEST WITH CONTRAST TECHNIQUE: Multidetector CT imaging of the chest was performed using the standard protocol during bolus administration of intravenous contrast. Multiplanar CT image reconstructions and MIPs were obtained to evaluate the vascular anatomy. CONTRAST:  OMNIPAQUE IOHEXOL 350 MG/ML SOLN COMPARISON:  Chest radiograph November 15, 2018; CT angiogram chest October 24, 2017 FINDINGS: Cardiovascular: There is no demonstrable pulmonary embolus. There is no thoracic aortic aneurysm or dissection. The visualized great vessels appear unremarkable. There is no evident pericardial effusion or pericardial thickening. Mediastinum/Nodes: Thyroid appears unremarkable. There is no demonstrable thoracic adenopathy. No esophageal lesions are evident. Lungs/Pleura: There is a focal area of airspace consolidation in the posterior segment left lower lobe. There is atelectatic change in the right lower lobe with questionable minimal superimposed infiltrate in this area. No pleural effusions are evident. Upper Abdomen: Visualized upper abdominal structures appear unremarkable. Musculoskeletal: There are no blastic or lytic bone lesions. No chest wall lesions are evident. Review of the MIP images confirms the above findings. IMPRESSION: 1. No demonstrable pulmonary embolus. No thoracic aortic aneurysm or dissection. 2. Focal area of airspace consolidation posterior segment left lower lobe consistent with pneumonia. Atelectatic change in the posterior right base with suspected minimal associated infiltrate/pneumonia. Lungs elsewhere are clear. 3.  No evident thoracic adenopathy. Electronically Signed   By: Bretta Bang III M.D.   On: 11/15/2018 13:00    Procedures Procedures  (including critical care time)  Medications Ordered in ED Medications  sodium chloride flush (NS) 0.9 % injection 3 mL (3 mLs Intravenous Not Given 11/15/18 0942)  cefTRIAXone (ROCEPHIN) 2 g in sodium chloride 0.9 % 100 mL IVPB (2 g Intravenous New Bag/Given 11/15/18 1419)  azithromycin (ZITHROMAX) 500 mg in sodium chloride 0.9 % 250 mL IVPB (has no administration in time range)  sodium chloride 0.9 % bolus 1,000 mL (0 mLs Intravenous Stopped 11/15/18 1324)  ondansetron (ZOFRAN) injection 4 mg (4 mg Intravenous Given 11/15/18 0958)  acetaminophen (TYLENOL) tablet 1,000 mg (1,000 mg Oral Given 11/15/18 0949)  morphine 4 MG/ML injection 4 mg (4 mg Intravenous Given 11/15/18 1232)  iohexol (OMNIPAQUE) 350 MG/ML injection 100 mL (100 mLs Intravenous Contrast Given 11/15/18 1242)  sodium chloride 0.9 % bolus 1,000 mL (1,000 mLs Intravenous New Bag/Given 11/15/18 1400)     Initial Impression / Assessment and Plan / ED Course  I have reviewed the triage vital signs and the nursing notes.  Pertinent labs & imaging results that were available during my care of the patient were reviewed by me and considered in my medical decision making (see chart for details).  Clinical Course as of Nov 15 1454  Thu Nov 15, 2018  1453 SARS Coronavirus 2: NEGATIVE [JS]    Clinical Course User Index [JS] Claude Manges, PA-C       Patient with a past medical history of cocaine abuse presents to the ED with complaints of chest pain, fever, shortness of breath.  Patient reports he axillary for some B symptoms 2 days ago, recently traveled to [place], has some concerns for COVID-19 infection.  Has been running a T-max of 102 while at home, has been taking Tylenol and ibuprofen without improvement in symptoms.  Reports overall body aches, fatigue, significant tenderness to his chest. Of note, patient last used cocaine yesterday, denies any history of IV drug use recently.  BMP shows some mild hyponatremia, hypochloremia,  creatinine level slightly elevated, suspect this is due to dehydration along with COVID-19 infection.  CBC with small amount of leukocytes at 11.8.  Opponent is positive at 27, will obtain delta screening.    Suspect COVID-19 test was falsely negative, as patient reports the symptoms consistent with lack of taste, lack of smell and chills. No other sources of infection.   Chest x-ray showed: Mild left suprahilar atelectasis/infiltrate.  EKG remarkable for sinus  tachycardia.  2:54 PM patient is persistently tachycardic despite fluids, he was attempted to be ambulated by nursing staff however desatted to 88% on room air.  Suspect patient cannot go home as he is tachypneic, persistently complains of pleuritic pain.  At this time will contact internal medicine service pending patient admission.  Portions of this note were generated with [employer]. Dictation errors may occur despite best attempts at proofreading.   Final Clinical Impressions(s) / ED Diagnoses   Final diagnoses:  Community acquired pneumonia of left lower lobe of lung  Tachypnea    ED Discharge Orders    None       Janeece Fitting, PA-C 11/15/18 1456    Veryl Speak, MD 11/15/18 1505

## 2018-11-15 NOTE — ED Notes (Signed)
Admit doctor in the ED notified Dr Neysa Bonito of lactic acid 2.1. Doctor spoke with patient and still want to leave and will be AMA. Patient stated will come back to the ED if not feeling better.

## 2018-11-15 NOTE — ED Notes (Signed)
Notified admit doctor of patient's temperature.

## 2018-11-15 NOTE — ED Notes (Signed)
Patient currently letting phlebotomy  draw blood now.

## 2018-11-15 NOTE — ED Notes (Signed)
Gave patient warm meal tray.

## 2018-11-15 NOTE — ED Notes (Signed)
I

## 2018-11-15 NOTE — Discharge Summary (Signed)
AMA NOTE    Patient ID: Timothy Christensen MRN: [id number] DOB/AGE: 1995-05-05 23 y.o.  Admit date: 11/15/2018 Discharge date: 11/15/2018   PLEASE NOTE THAT PATIENT LEFT AGAINST MEDICAL ADVICE. Risks of potential sepsis and worsening infection were explained in detail to the patient and he/she verbally understood the risks of leaving AMA.   Primary Care Physician:  Patient, No Pcp Per  Discharge Diagnoses:   Present on Admission: . CAP (community acquired pneumonia)   Consults:  None     DIET: General    Allergies:  No Known Allergies   Discharge Medications: Please note that patient left AMA (against medical advice)   Brief H and P: This is a 23 year old male with past medical history of cocaine abuse who presented to the ED on 11/15/2018 with complaints of left-sided chest pain x3 days which has been constant and worsening not associated with cocaine use.  Patient's last use of cocaine was yesterday, after the onset of pain.  He has never had this before.  Pain worsened with inspiration.  Admits to having fevers up to 101 F at home.  Patient had also complained of loss of taste over the past couple days.    In the ED the patient was found to be febrile with severe pain to palpation over left chest.  Skin was erythematous and warm.  He was given morphine IV x2 as well as normal saline fluid bolus x2 and was started on CAP therapy with ceftriaxone and azithromycin for suspected community-acquired pneumonia with evidence of left lower lobe consolidation on imaging. He had a negative rapid COVID 19 test.  I was paged from the ED nurse who stated that the patient wished to leave AMA.  I presented to the bedside in the ED and explained to the patient risks and benefits of leaving and he understood.  He was already dressed with out his IV lines in place at this time and insistent upon leaving that he would come back to the ED if he had any further issues.  Hospital Course:  Active  Problems:   CAP (community acquired pneumonia)   Day of Discharge BP 111/80   Pulse (!) 122   Temp 99.9 F (37.7 C) (Oral)   Resp (!) 22   Ht 6' (1.829 m)   Wt 72.6 kg   SpO2 99%   BMI 21.70 kg/m   Physical Exam: Physical Exam Vitals signs and nursing note reviewed.  Constitutional:      Appearance: He is ill-appearing.  HENT:     Head: Normocephalic and atraumatic.     Mouth/Throat:     Mouth: Mucous membranes are moist.  Eyes:     Extraocular Movements: Extraocular movements intact.  Cardiovascular:     Rate and Rhythm: Tachycardia present.  Pulmonary:     Comments: chest pain with inspiration and limiting inspiration.  This resolved prior to his AMA discharge Abdominal:     General: Abdomen is flat.     Palpations: Abdomen is soft.  Musculoskeletal:        General: No swelling or tenderness.  Skin:    General: Skin is warm.     Findings: Erythema present.  Neurological:     Mental Status: He is alert. Mental status is at baseline.  Psychiatric:     Comments: Poor judgment       The results of significant diagnostics from this hospitalization (including imaging, microbiology, ancillary and laboratory) are listed below for reference.    LAB RESULTS:  Basic Metabolic Panel: Recent Labs  Lab 11/15/18 0811  NA 134*  K 3.6  CL 97*  CO2 26  GLUCOSE 110*  BUN 22*  CREATININE 1.36*  CALCIUM 8.7*   Liver Function Tests: No results for input(s): AST, ALT, ALKPHOS, BILITOT, PROT, ALBUMIN in the last 168 hours. No results for input(s): LIPASE, AMYLASE in the last 168 hours. No results for input(s): AMMONIA in the last 168 hours. CBC: Recent Labs  Lab 11/15/18 0811  WBC 11.8*  HGB 16.3  HCT 48.1  MCV 85.9  PLT 114*   Cardiac Enzymes: No results for input(s): CKTOTAL, CKMB, CKMBINDEX, TROPONINI in the last 168 hours. BNP: Invalid input(s): POCBNP CBG: No results for input(s): GLUCAP in the last 168 hours.  Significant Diagnostic Studies:  Dg  Chest 2 View  Result Date: 11/15/2018 CLINICAL DATA:  Shortness of breath.  Cough. EXAM: CHEST - 2 VIEW COMPARISON:  07/05/2018. FINDINGS: Mediastinum and hilar structures normal. Heart size normal. Mild left suprahilar atelectasis/infiltrate. No pleural effusion or pneumothorax. No acute bony abnormality. IMPRESSION: Mild left suprahilar atelectasis/infiltrate. Electronically Signed   By: Maisie Fus  Register   On: 11/15/2018 08:43   Ct Angio Chest Pe W And/or Wo Contrast  Result Date: 11/15/2018 CLINICAL DATA:  Shortness of breath EXAM: CT ANGIOGRAPHY CHEST WITH CONTRAST TECHNIQUE: Multidetector CT imaging of the chest was performed using the standard protocol during bolus administration of intravenous contrast. Multiplanar CT image reconstructions and MIPs were obtained to evaluate the vascular anatomy. CONTRAST:  OMNIPAQUE IOHEXOL 350 MG/ML SOLN COMPARISON:  Chest radiograph November 15, 2018; CT angiogram chest October 24, 2017 FINDINGS: Cardiovascular: There is no demonstrable pulmonary embolus. There is no thoracic aortic aneurysm or dissection. The visualized great vessels appear unremarkable. There is no evident pericardial effusion or pericardial thickening. Mediastinum/Nodes: Thyroid appears unremarkable. There is no demonstrable thoracic adenopathy. No esophageal lesions are evident. Lungs/Pleura: There is a focal area of airspace consolidation in the posterior segment left lower lobe. There is atelectatic change in the right lower lobe with questionable minimal superimposed infiltrate in this area. No pleural effusions are evident. Upper Abdomen: Visualized upper abdominal structures appear unremarkable. Musculoskeletal: There are no blastic or lytic bone lesions. No chest wall lesions are evident. Review of the MIP images confirms the above findings. IMPRESSION: 1. No demonstrable pulmonary embolus. No thoracic aortic aneurysm or dissection. 2. Focal area of airspace consolidation posterior  segment left lower lobe consistent with pneumonia. Atelectatic change in the posterior right base with suspected minimal associated infiltrate/pneumonia. Lungs elsewhere are clear. 3.  No evident thoracic adenopathy. Electronically Signed   By: Bretta Bang III M.D.   On: 11/15/2018 13:00    2D ECHO:   Disposition and Follow-up: Discharge Instructions    Diet - low sodium heart healthy   Complete by: As directed    Increase activity slowly   Complete by: As directed        DISPOSITION: Patient left AMA. He/ she was advised to seek follow-up with primary care physician.     DISCHARGE FOLLOW-UP    Time spent on Discharge: 25 minutes  Signed:   Jae Dire D.O. [facility] 11/15/2018, 6:42 PM

## 2018-11-15 NOTE — ED Triage Notes (Signed)
Pt reports several days of chest pain with coughing, subjective fever. Thinks he may have COVID but no known exposure. Pt awake, alert, appropriate NAD at present.

## 2018-11-15 NOTE — ED Notes (Signed)
Got patient on the monitor patient is resting with call bell in reach  ?

## 2018-11-15 NOTE — ED Notes (Signed)
Pt ambulated in hallway with pulse oximetry.  SPO2 decreased to 90 while ambulating, then returned to 99 after lying in bed.  Pt complained of increased shortness of breath and chest pain while ambulating.

## 2019-08-05 ENCOUNTER — Encounter (HOSPITAL_COMMUNITY): Payer: Self-pay | Admitting: Emergency Medicine

## 2019-08-05 ENCOUNTER — Emergency Department (HOSPITAL_COMMUNITY): Payer: Self-pay

## 2019-08-05 ENCOUNTER — Other Ambulatory Visit: Payer: Self-pay

## 2019-08-05 ENCOUNTER — Inpatient Hospital Stay (HOSPITAL_COMMUNITY)
Admission: EM | Admit: 2019-08-05 | Discharge: 2019-08-08 | DRG: 290 | Disposition: A | Discharge status: Home / Self Care | Payer: Self-pay | Attending: Internal Medicine | Admitting: Internal Medicine

## 2019-08-05 DIAGNOSIS — F17210 Nicotine dependence, cigarettes, uncomplicated: Secondary | ICD-10-CM | POA: Diagnosis present

## 2019-08-05 DIAGNOSIS — I330 Acute and subacute infective endocarditis: Principal | ICD-10-CM

## 2019-08-05 DIAGNOSIS — F1990 Other psychoactive substance use, unspecified, uncomplicated: Secondary | ICD-10-CM | POA: Diagnosis present

## 2019-08-05 DIAGNOSIS — I059 Rheumatic mitral valve disease, unspecified: Secondary | ICD-10-CM | POA: Diagnosis present

## 2019-08-05 DIAGNOSIS — Z720 Tobacco use: Secondary | ICD-10-CM | POA: Diagnosis present

## 2019-08-05 DIAGNOSIS — F1910 Other psychoactive substance abuse, uncomplicated: Secondary | ICD-10-CM | POA: Diagnosis present

## 2019-08-05 DIAGNOSIS — F1110 Opioid abuse, uncomplicated: Secondary | ICD-10-CM | POA: Diagnosis present

## 2019-08-05 DIAGNOSIS — Z20822 Contact with and (suspected) exposure to covid-19: Secondary | ICD-10-CM | POA: Diagnosis present

## 2019-08-05 DIAGNOSIS — B952 Enterococcus as the cause of diseases classified elsewhere: Secondary | ICD-10-CM | POA: Diagnosis present

## 2019-08-05 DIAGNOSIS — F1310 Sedative, hypnotic or anxiolytic abuse, uncomplicated: Secondary | ICD-10-CM | POA: Diagnosis present

## 2019-08-05 DIAGNOSIS — F1410 Cocaine abuse, uncomplicated: Secondary | ICD-10-CM | POA: Diagnosis present

## 2019-08-05 DIAGNOSIS — R7881 Bacteremia: Secondary | ICD-10-CM | POA: Diagnosis present

## 2019-08-05 HISTORY — DX: Endocarditis, valve unspecified: I38

## 2019-08-05 LAB — BASIC METABOLIC PANEL
Anion gap: 12 (ref 5–15)
BUN: 18 mg/dL (ref 6–20)
CO2: 20 mmol/L — ABNORMAL LOW (ref 22–32)
Calcium: 9.2 mg/dL (ref 8.9–10.3)
Chloride: 107 mmol/L (ref 98–111)
Creatinine, Ser: 0.86 mg/dL (ref 0.61–1.24)
GFR calc Af Amer: >60 mL/min (ref >60)
GFR calc non Af Amer: >60 mL/min (ref >60)
Glucose, Bld: 113 mg/dL — ABNORMAL HIGH (ref 70–99)
Potassium: 4.2 mmol/L (ref 3.5–5.1)
Sodium: 139 mmol/L (ref 135–145)

## 2019-08-05 LAB — RAPID URINE DRUG SCREEN, HOSP PERFORMED
Amphetamines: NOT DETECTED
Barbiturates: NOT DETECTED
Benzodiazepines: POSITIVE — AB
Cocaine: POSITIVE — AB
Opiates: POSITIVE — AB
Tetrahydrocannabinol: NOT DETECTED

## 2019-08-05 LAB — TROPONIN I (HIGH SENSITIVITY)
Troponin I (High Sensitivity): 2 ng/L (ref <18)
Troponin I (High Sensitivity): 3 ng/L (ref <18)

## 2019-08-05 LAB — CBC
HCT: 42.5 % (ref 39.0–52.0)
Hemoglobin: 13.5 g/dL (ref 13.0–17.0)
MCH: 27.2 pg (ref 26.0–34.0)
MCHC: 31.8 g/dL (ref 30.0–36.0)
MCV: 85.7 fL (ref 80.0–100.0)
Platelets: 255 10*3/uL (ref 150–400)
RBC: 4.96 MIL/uL (ref 4.22–5.81)
RDW: 15.6 % — ABNORMAL HIGH (ref 11.5–15.5)
WBC: 4.1 10*3/uL (ref 4.0–10.5)
nRBC: 0 % (ref 0.0–0.2)

## 2019-08-05 LAB — LACTIC ACID, PLASMA: Lactic Acid, Venous: 1.8 mmol/L (ref 0.5–1.9)

## 2019-08-05 LAB — SARS CORONAVIRUS 2 BY RT PCR (HOSPITAL ORDER, PERFORMED IN ~~LOC~~ HOSPITAL LAB): SARS Coronavirus 2: NEGATIVE

## 2019-08-05 LAB — PROTIME-INR
INR: 1.1 (ref 0.8–1.2)
Prothrombin Time: 13.5 seconds (ref 11.4–15.2)

## 2019-08-05 LAB — HIV ANTIBODY (ROUTINE TESTING W REFLEX): HIV Screen 4th Generation wRfx: NONREACTIVE

## 2019-08-05 MED ORDER — ACETAMINOPHEN 650 MG RE SUPP: 650.0000 mg | Freq: Four times a day (QID) | RECTAL | Status: DC | PRN

## 2019-08-05 MED ORDER — GENTAMICIN IN SALINE 1.2-0.9 MG/ML-% IV SOLN
60.0000 mg | Freq: Once | INTRAVENOUS | Status: AC
  Administered 2019-08-05: 60 mg via INTRAVENOUS
  Filled 2019-08-05: qty 50

## 2019-08-05 MED ORDER — SODIUM CHLORIDE 0.9 % IV SOLN
INTRAVENOUS | Status: DC | PRN
  Administered 2019-08-05: 250 mL via INTRAVENOUS

## 2019-08-05 MED ORDER — ONDANSETRON HCL 4 MG PO TABS
4.0000 mg | ORAL_TABLET | Freq: Four times a day (QID) | ORAL | Status: DC | PRN
  Administered 2019-08-05: 4 mg via ORAL
  Filled 2019-08-05: qty 1

## 2019-08-05 MED ORDER — ALBUTEROL SULFATE (2.5 MG/3ML) 0.083% IN NEBU: 2.5000 mg | INHALATION_SOLUTION | Freq: Four times a day (QID) | RESPIRATORY_TRACT | Status: DC | PRN

## 2019-08-05 MED ORDER — ACETAMINOPHEN 325 MG PO TABS
650.0000 mg | ORAL_TABLET | Freq: Four times a day (QID) | ORAL | Status: DC | PRN
  Administered 2019-08-06: 650 mg via ORAL
  Filled 2019-08-05: qty 2

## 2019-08-05 MED ORDER — DIAZEPAM 5 MG PO TABS
10.0000 mg | ORAL_TABLET | Freq: Two times a day (BID) | ORAL | Status: AC | PRN
  Administered 2019-08-05: 10 mg via ORAL
  Filled 2019-08-05: qty 2

## 2019-08-05 MED ORDER — GENTAMICIN IN SALINE 1.2-0.9 MG/ML-% IV SOLN
60.0000 mg | Freq: Two times a day (BID) | INTRAVENOUS | Status: DC
  Administered 2019-08-06 – 2019-08-08 (×6): 60 mg via INTRAVENOUS
  Filled 2019-08-05 (×7): qty 50

## 2019-08-05 MED ORDER — SODIUM CHLORIDE 0.9 % IV SOLN
2.0000 g | Freq: Once | INTRAVENOUS | Status: AC
  Administered 2019-08-05: 2 g via INTRAVENOUS
  Filled 2019-08-05: qty 2000

## 2019-08-05 MED ORDER — ENOXAPARIN SODIUM 40 MG/0.4ML ~~LOC~~ SOLN
40.0000 mg | SUBCUTANEOUS | Status: DC
  Administered 2019-08-07: 40 mg via SUBCUTANEOUS
  Filled 2019-08-05: qty 0.4

## 2019-08-05 MED ORDER — SODIUM CHLORIDE 0.9 % IV SOLN
2.0000 g | INTRAVENOUS | Status: DC
  Administered 2019-08-05 – 2019-08-08 (×17): 2 g via INTRAVENOUS
  Filled 2019-08-05 (×6): qty 2000
  Filled 2019-08-05: qty 2
  Filled 2019-08-05 (×2): qty 2000
  Filled 2019-08-05 (×2): qty 2
  Filled 2019-08-05 (×2): qty 2000
  Filled 2019-08-05: qty 2
  Filled 2019-08-05 (×2): qty 2000
  Filled 2019-08-05 (×5): qty 2
  Filled 2019-08-05: qty 2000
  Filled 2019-08-05: qty 2

## 2019-08-05 MED ORDER — ONDANSETRON HCL 4 MG/2ML IJ SOLN: 4.0000 mg | Freq: Four times a day (QID) | INTRAMUSCULAR | Status: DC | PRN

## 2019-08-05 MED ORDER — SODIUM CHLORIDE 0.9% FLUSH
3.0000 mL | Freq: Once | INTRAVENOUS | Status: AC
  Administered 2019-08-05: 3 mL via INTRAVENOUS

## 2019-08-05 MED ORDER — BUPRENORPHINE HCL-NALOXONE HCL 8-2 MG SL SUBL
1.0000 | SUBLINGUAL_TABLET | Freq: Three times a day (TID) | SUBLINGUAL | Status: DC
  Administered 2019-08-05 – 2019-08-08 (×9): 1 via SUBLINGUAL
  Filled 2019-08-05 (×9): qty 1

## 2019-08-05 NOTE — ED Triage Notes (Signed)
Patient reports chronic  left chest pain with SOB , denies emesis or diaphoresis , occasional productive cough with no fever .

## 2019-08-05 NOTE — Plan of Care (Signed)
  Problem: Activity: Goal: Risk for activity intolerance will decrease Outcome: Progressing   Problem: Coping: Goal: Level of anxiety will decrease Outcome: Progressing   Problem: Safety: Goal: Ability to remain free from injury will improve Outcome: Progressing   

## 2019-08-05 NOTE — Progress Notes (Signed)
Patient arrived to [address], no complaints or concerns. Placed on cardiac monitor, however he refused to be on teli and stated that box is uncomfortable. He also refused Lovenox sq. Educated prior this. Paged MD Katrinka Blazing and informed him.

## 2019-08-05 NOTE — Progress Notes (Signed)
Pharmacy Antibiotic Note  Timothy Christensen is a 24 y.o. male admitted on 08/05/2019 with bacteremia and endocarditis.  Pharmacy has been consulted for ampicillin and gentamicin dosing for enterococcal MV endocarditis/bacteremia. Pt was admitted to [facility] and undergoing treatment but left 6/19. To resume previous regimen which was confirmed by [facility] pharmacy.   Plan: Restart Ampicillin 2gm IV Q4H Restart Gent 60mg  IV Q12H F/u renal fxn, C&S, clinical status and levels PRN  Height: 6' (182.9 cm) Weight: 72.6 kg (160 lb) IBW/kg (Calculated) : 77.6  Temp (24hrs), Avg:98.2 F (36.8 C), Min:98.2 F (36.8 C), Max:98.2 F (36.8 C)  Recent Labs  Lab 08/05/19 0233 08/05/19 1013  WBC 4.1  --   CREATININE 0.86  --   LATICACIDVEN  --  1.8    Estimated Creatinine Clearance: 133.7 mL/min (by C-G formula based on SCr of 0.86 mg/dL).    No Known Allergies  Antimicrobials this admission: Amp 5/27>>6/19; 6/21>> [birth year]-1977 6/1>>6/19; 6/21>>  Dose adjustments this admission: N/A  Microbiology results: Pending  Thank you for allowing pharmacy to be a part of this patient's care.  Timothy Christensen, 08-28-1971 08/05/2019 12:14 PM

## 2019-08-05 NOTE — H&P (Signed)
History and Physical    Timothy Christensen FVC:[id number] DOB: [birth year] DOA: 08/05/2019  Referring MD/NP/PA: Lynden Oxford, MD PCP: Patient, No Pcp Per  Patient coming from: Home   Chief Complaint: Chest pain  I have personally briefly reviewed patient's old medical records in [facility]   HPI: Timothy Christensen is a 24 y.o. male with medical history significant of polysubstance abuse(IV drug use, cocaine, opiate, tobacco) who presents to the hospital with complaints of chest pain.  He reports that he just left [facility] hospital 2 days ago after being there for the last 4 weeks receiving IV antibiotics for Enterococcus faecalis bacteremia and mitral valve endocarditis with severe mitral regurgitation on echocardiogram.  Patient had 2 more weeks left of IV antibiotics per discharge paperwork and thereafter was ultimately recommended to need surgical correction.  States that he had to leave the hospital due to multiple reasons including someone getting involved in a car accident and being closer to his home.  He complains of having continued severe left-sided chest pain that he describes as sharp and pressure-like in nature.  Associated symptoms of shortness of breath and generalized malaise.  After leaving the hospital patient did admit to snorting cocaine.  Denies having any fevers, leg swelling, loss of consciousness, nausea, vomiting, or dysuria.  ED Course: Upon admission to the emergency department patient was noted to have stable vital signs.  Labs are unremarkable including 2 - troponins.  Chest x-ray showed no acute abnormalities.  Blood cultures were obtained.  Previous antibiotics given for confirmed by pharmacy and restarted on ampicillin and gentamicin.  TRH called to admit.  Review of Systems  Constitutional: Positive for malaise/fatigue. Negative for fever.  Eyes: Negative for photophobia and discharge.  Respiratory: Positive for shortness of breath.   Cardiovascular: Positive for chest  pain.  Gastrointestinal: Negative for abdominal pain, nausea and vomiting.  Genitourinary: Negative for dysuria and hematuria.  Neurological: Negative for focal weakness.  Psychiatric/Behavioral: Positive for substance abuse.    History reviewed. No pertinent past medical history.  Past Surgical History:  Procedure Laterality Date  . I & D EXTREMITY Left 07/08/2018   Procedure: IRRIGATION AND DEBRIDEMENT HUMERUS;  Surgeon: Bjorn Pippin, MD;  Location: [facility] OR;  Service: Orthopedics;  Laterality: Left;  . WRIST SURGERY       reports that he has been smoking cigarettes. He has been smoking about 0.50 packs per day. He has never used smokeless tobacco. He reports current drug use. Drugs: Marijuana and Cocaine. He reports that he does not drink alcohol.  No Known Allergies  Family history. Significant for hypertension  Prior to Admission medications   Not on File    Physical Exam:  Constitutional: Young male currently in no acute distress Vitals:   08/05/19 0759 08/05/19 1024 08/05/19 1035 08/05/19 1045  BP: 115/85  110/74 110/72  Pulse: 70     Resp: 15  13 14   Temp:      TempSrc:      SpO2: 100%     Weight:  72.6 kg    Height:  6' (1.829 m)     Eyes: PERRL, lids and conjunctivae normal ENMT: Mucous membranes are dry.  Posterior pharynx clear of any exudate or lesions.  Neck: normal, supple, no masses, no thyromegaly Respiratory: clear to auscultation bilaterally, no wheezing, no crackles. Normal respiratory effort. No accessory muscle use.  Cardiovascular: Regular rate and rhythm with 3/6 systolic murmur present.  No lower extremity swelling. Abdomen: no tenderness, no masses  palpated. No hepatosplenomegaly. Bowel sounds positive.  Musculoskeletal: no clubbing / cyanosis. No joint deformity upper and lower extremities. Good ROM, no contractures. Normal muscle tone.  Skin: no rashes, lesions, ulcers. No induration Neurologic: CN 2-12 grossly intact. Sensation intact, DTR  normal. Strength 5/5 in all 4.  Psychiatric: Normal judgment and insight. Alert and oriented x 3. Normal mood.     Labs on Admission: I have personally reviewed following labs and imaging studies  CBC: Recent Labs  Lab 08/05/19 0233  WBC 4.1  HGB 13.5  HCT 42.5  MCV 85.7  PLT 440   Basic Metabolic Panel: Recent Labs  Lab 08/05/19 0233  NA 139  K 4.2  CL 107  CO2 20*  GLUCOSE 113*  BUN 18  CREATININE 0.86  CALCIUM 9.2   GFR: Estimated Creatinine Clearance: 133.7 mL/min (by C-G formula based on SCr of 0.86 mg/dL). Liver Function Tests: No results for input(s): AST, ALT, ALKPHOS, BILITOT, PROT, ALBUMIN in the last 168 hours. No results for input(s): LIPASE, AMYLASE in the last 168 hours. No results for input(s): AMMONIA in the last 168 hours. Coagulation Profile: Recent Labs  Lab 08/05/19 0233  INR 1.1   Cardiac Enzymes: No results for input(s): CKTOTAL, CKMB, CKMBINDEX, TROPONINI in the last 168 hours. BNP (last 3 results) No results for input(s): PROBNP in the last 8760 hours. HbA1C: No results for input(s): HGBA1C in the last 72 hours. CBG: No results for input(s): GLUCAP in the last 168 hours. Lipid Profile: No results for input(s): CHOL, HDL, LDLCALC, TRIG, CHOLHDL, LDLDIRECT in the last 72 hours. Thyroid Function Tests: No results for input(s): TSH, T4TOTAL, FREET4, T3FREE, THYROIDAB in the last 72 hours. Anemia Panel: No results for input(s): VITAMINB12, FOLATE, FERRITIN, TIBC, IRON, RETICCTPCT in the last 72 hours. Urine analysis:    Component Value Date/Time   COLORURINE YELLOW 07/05/2018 2024   APPEARANCEUR CLEAR 07/05/2018 2024   LABSPEC 1.013 07/05/2018 2024   PHURINE 6.0 07/05/2018 2024   GLUCOSEU NEGATIVE 07/05/2018 2024   HGBUR NEGATIVE 07/05/2018 2024   [place] NEGATIVE 07/05/2018 2024   [place] NEGATIVE 07/05/2018 2024   PROTEINUR NEGATIVE 07/05/2018 2024   NITRITE NEGATIVE 07/05/2018 2024   LEUKOCYTESUR NEGATIVE 07/05/2018 2024    Sepsis Labs: No results found for this or any previous visit (from the past 240 hour(s)).   Radiological Exams on Admission: DG Chest 2 View  Result Date: 08/05/2019 CLINICAL DATA:  Chest pain, history of endocarditis EXAM: CHEST - 2 VIEW COMPARISON:  11/15/2018 FINDINGS: Cardiomediastinal contours and hilar structures are normal. Lungs are clear.  No effusion. Visualized skeletal structures on limited assessment are unremarkable. IMPRESSION: No acute cardiopulmonary disease. Electronically Signed   By: Zetta Bills M.D.   On: 08/05/2019 10:30    EKG: Independently reviewed.  Sinus rhythm at 75 bpm with  Assessment/Plan Enterococcal faecalis bacteremia  Mitral valve endocarditis: Patient presents with complaints of chest pain and reports of shortness of breath.  Cardiac troponins negative x2, but EKG with ST wave elevations.  Chest x-ray otherwise clear with unremarkable lab work.  Per discharge paperwork patient was noted to have endocarditis of the mitral valve with severe regurgitation by echocardiogram.  Patient clearly had 2 weeks left of antibiotics left. -Admit to a cardiac telemetry bed -Follow-up blood cultures -Authorization sent to obtain records from [place] empiric antibiotics of ampicillin and gentamicin per pharmacy -Check echocardiogram and consider formally consulting cardiology if significant change appreciated -ID formally consulted, will follow-up for further recommendation  Polysubstance abuse: Acute on chronic.  Patient admits to recently snorting cocaine 2 days ago after leaving [facility] hospital.  He reports prior history of IV drug use, but reports that he last injected a year or so ago.  He declined need of nicotine patch. -Follow-up urine drug -Continue Suboxone 8-2 mg TID -Continue counseling on need of cessation of substance use -Transitions of care consult for opiate abuse  DVT prophylaxis: Lovenox  Code Status: Full  Family Communication: No family  requested to be updated Disposition Plan: To be determined Consults called: ID Admission status: Inpatient   Clydie Braun MD [facility] Pager [phone]   If 7PM-7AM, please contact night-coverage [email or web address] Password [facility]  08/05/2019, 11:34 AM

## 2019-08-05 NOTE — Progress Notes (Signed)
Pt. Requesting home med. On call for [facility] paged to make aware.

## 2019-08-05 NOTE — Consult Note (Signed)
[place] for Infectious Disease    Date of Admission:  08/05/2019                  Reason for Consult: Enterococcal mitral valve endocarditis    Referring Provider: Fuller Plan  Assessment: Treatment guidelines for native valve endocarditis call for 4 to 6 weeks of therapy for left-sided endocarditis due to Enterococcus.  It is possible that his infection has been cured but I agree with continuing ampicillin and gentamicin pending review of records from [facility].  Plan: 1. Continue ampicillin and gentamicin  Principal Problem:   Endocarditis of mitral valve Active Problems:   Bacteremia due to Enterococcus   IVDU (intravenous drug user)/Opiates/Cocaine   Polysubstance abuse /Cocaine and Opiates   Tobacco abuse   Scheduled Meds:  buprenorphine-naloxone  1 tablet Sublingual TID   enoxaparin (LOVENOX) injection  40 mg Subcutaneous Q24H   Continuous Infusions:  ampicillin (OMNIPEN) IV 2 g (08/05/19 1423)   gentamicin     PRN Meds:.acetaminophen **OR** acetaminophen, albuterol, ondansetron **OR** ondansetron (ZOFRAN) IV  HPI: Timothy Christensen is a 24 y.o. male who was hospitalized at [facility] about 1 month ago and found to have enterococcal mitral valve endocarditis.  He says that he received 4 weeks of ampicillin and gentamicin.  He says the doctors there recommended 2 more weeks of treatment but he left the hospital last Friday to attend to some personal business.  He said he started feeling a little bit worse.  He has intermittent left chest pain and started having night sweats again causing him to come to the emergency department here.  He says that he was told that he would need to have surgery on his mitral valve but never met with any of the [facility] cardiac surgical team.  He says that he used cocaine after he left the hospital.  He initially said that he never injects drugs then said that he has tried it before but does not like it.   Review of Systems: Review  of Systems  Constitutional: Positive for diaphoresis and malaise/fatigue. Negative for chills, fever and weight loss.  Respiratory: Negative for cough, sputum production and shortness of breath.   Cardiovascular: Positive for chest pain.  Gastrointestinal: Negative for abdominal pain, diarrhea, nausea and vomiting.  Genitourinary: Negative for dysuria.  Musculoskeletal: Negative for back pain, joint pain and myalgias.  Skin: Negative for rash.  Neurological: Negative for headaches.  Psychiatric/Behavioral: Positive for substance abuse.    History reviewed. No pertinent past medical history.  Social History   Tobacco Use   Smoking status: Current Every Day Smoker    Packs/day: 0.50    Types: Cigarettes   Smokeless tobacco: Never Used  Vaping Use   Vaping Use: Never used  Substance Use Topics   Alcohol use: No   Drug use: Yes    Types: Marijuana, Cocaine    No family history on file. No Known Allergies  OBJECTIVE: Blood pressure 110/72, pulse 70, temperature 98.2 F (36.8 C), temperature source Oral, resp. rate 14, height 6' (1.829 m), weight 72.6 kg, SpO2 100 %.  Physical Exam Constitutional:      Comments: He is resting quietly on a stretcher in the ED watching a movie on his phone.  Eyes:     Conjunctiva/sclera: Conjunctivae normal.  Cardiovascular:     Rate and Rhythm: Normal rate and regular rhythm.     Heart sounds: Murmur heard.  Comments: 3/6 holosystolic murmur heard best in the left axillary line. Pulmonary:     Effort: Pulmonary effort is normal.     Breath sounds: Normal breath sounds.  Abdominal:     Palpations: Abdomen is soft.     Tenderness: There is no abdominal tenderness.  Musculoskeletal:        General: No swelling or tenderness.  Skin:    Findings: No rash.  Neurological:     General: No focal deficit present.  Psychiatric:        Mood and Affect: Mood normal.     Lab Results Lab Results  Component Value Date   WBC 4.1  08/05/2019   HGB 13.5 08/05/2019   HCT 42.5 08/05/2019   MCV 85.7 08/05/2019   PLT 255 08/05/2019    Lab Results  Component Value Date   CREATININE 0.86 08/05/2019   BUN 18 08/05/2019   NA 139 08/05/2019   K 4.2 08/05/2019   CL 107 08/05/2019   CO2 20 (L) 08/05/2019    Lab Results  Component Value Date   ALT 19 07/07/2018   AST 16 07/07/2018   ALKPHOS 74 07/07/2018   BILITOT 0.3 07/07/2018     Microbiology: Recent Results (from the past 240 hour(s))  SARS Coronavirus 2 by RT PCR (hospital order, performed in [place] hospital lab) Nasopharyngeal Nasopharyngeal Swab     Status: None   Collection Time: 08/05/19  9:51 AM   Specimen: Nasopharyngeal Swab  Result Value Ref Range Status   SARS Coronavirus 2 NEGATIVE NEGATIVE Final    Comment: (NOTE) SARS-CoV-2 target nucleic acids are NOT DETECTED.  The SARS-CoV-2 RNA is generally detectable in upper and lower respiratory specimens during the acute phase of infection. The lowest concentration of SARS-CoV-2 viral copies this assay can detect is 250 copies / mL. A negative result does not preclude SARS-CoV-2 infection and should not be used as the sole basis for treatment or other patient management decisions.  A negative result may occur with improper specimen collection / handling, submission of specimen other than nasopharyngeal swab, presence of viral mutation(s) within the areas targeted by this assay, and inadequate number of viral copies (<250 copies / mL). A negative result must be combined with clinical observations, patient history, and epidemiological information.  Fact Sheet for Patients:   StrictlyIdeas.no  Fact Sheet for Healthcare Providers: BankingDealers.co.za  This test is not yet approved or  cleared by the [place] FDA and has been authorized for detection and/or diagnosis of SARS-CoV-2 by FDA under an Emergency Use Authorization (EUA).  This EUA  will remain in effect (meaning this test can be used) for the duration of the COVID-19 declaration under Section 564(b)(1) of the Act, 21 U.S.C. section 360bbb-3(b)(1), unless the authorization is terminated or revoked sooner.  Performed at [facility], [place] [address] [place], [place] [address]     Michel Bickers, [place] [facility] [phone] pager   [phone] cell 08/05/2019, 2:37 PM

## 2019-08-05 NOTE — ED Provider Notes (Signed)
[facility] EMERGENCY DEPARTMENT Provider Note   CSN: [id number] Arrival date & time: 08/05/19  0048     History Chief Complaint  Patient presents with  . Chest Pain    Timothy Christensen is a 24 y.o. male.  The history is provided by the patient and medical records. No language interpreter was used.  Chest Pain Pain location:  L chest and substernal area Pain quality: aching   Pain radiates to:  Does not radiate Pain severity:  Moderate Onset quality:  Gradual Timing:  Constant Progression:  Unchanged Chronicity:  Chronic Relieved by:  Nothing Worsened by:  Coughing Ineffective treatments:  None tried Associated symptoms: cough, fatigue and shortness of breath   Associated symptoms: no abdominal pain, no altered mental status, no back pain, no claudication, no diaphoresis, no dizziness, no fever, no headache, no nausea, no numbness, no palpitations, no vomiting and no weakness            History reviewed. No pertinent past medical history.  Patient Active Problem List   Diagnosis Date Noted  . CAP (community acquired pneumonia) 11/15/2018  . Sepsis (HCC) 07/07/2018  . Noncompliance 07/06/2018  . Cocaine abuse (HCC) 07/06/2018  . Abscess   . Cellulitis and abscess of upper extremity 07/05/2018  . IVDU (intravenous drug user)/Opiates/Cocaine 07/05/2018  . Polysubstance abuse /Cocaine and Opiates 07/05/2018  . Tobacco abuse 07/05/2018    Past Surgical History:  Procedure Laterality Date  . I & D EXTREMITY Left 07/08/2018   Procedure: IRRIGATION AND DEBRIDEMENT HUMERUS;  Surgeon: Bjorn Pippin, MD;  Location: [facility] OR;  Service: Orthopedics;  Laterality: Left;  . WRIST SURGERY         No family history on file.  Social History   Tobacco Use  . Smoking status: Current Every Day Smoker    Packs/day: 0.50    Types: Cigarettes  . Smokeless tobacco: Never Used  Vaping Use  . Vaping Use: Never used  Substance Use Topics  . Alcohol use: No  .  Drug use: Yes    Types: Marijuana, Cocaine    Home Medications Prior to Admission medications   Not on File    Allergies    Patient has no known allergies.  Review of Systems   Review of Systems  Constitutional: Positive for chills and fatigue. Negative for diaphoresis and fever.  HENT: Negative for congestion.   Eyes: Negative for visual disturbance.  Respiratory: Positive for cough and shortness of breath. Negative for chest tightness and wheezing.   Cardiovascular: Positive for chest pain. Negative for palpitations, claudication and leg swelling.  Gastrointestinal: Negative for abdominal pain, constipation, diarrhea, nausea and vomiting.  Genitourinary: Negative for dysuria, flank pain and frequency.  Musculoskeletal: Negative for back pain, neck pain and neck stiffness.  Skin: Negative for rash and wound.  Neurological: Negative for dizziness, weakness, light-headedness, numbness and headaches.  Psychiatric/Behavioral: Negative for agitation and confusion.  All other systems reviewed and are negative.   Physical Exam Updated Vital Signs BP 115/85   Pulse 70   Temp 98.2 F (36.8 C) (Oral)   Resp 15   Ht 6' (1.829 m)   Wt 75 kg   SpO2 100%   BMI 22.42 kg/m   Physical Exam Vitals and nursing note reviewed.  Constitutional:      General: He is not in acute distress.    Appearance: He is well-developed. He is not ill-appearing, toxic-appearing or diaphoretic.  HENT:     Head: Normocephalic and  atraumatic.  Eyes:     Conjunctiva/sclera: Conjunctivae normal.     Pupils: Pupils are equal, round, and reactive to light.  Cardiovascular:     Rate and Rhythm: Normal rate and regular rhythm.     Heart sounds: Murmur heard.   Pulmonary:     Effort: Pulmonary effort is normal. No tachypnea or respiratory distress.     Breath sounds: Normal breath sounds. No wheezing, rhonchi or rales.  Chest:     Chest wall: No tenderness.  Abdominal:     Palpations: Abdomen is soft.      Tenderness: There is no abdominal tenderness.  Musculoskeletal:     Cervical back: Neck supple.     Right lower leg: No tenderness. No edema.     Left lower leg: No tenderness. No edema.  Skin:    General: Skin is warm and dry.     Capillary Refill: Capillary refill takes less than 2 seconds.  Neurological:     General: No focal deficit present.     Mental Status: He is alert.  Psychiatric:        Mood and Affect: Mood normal.     ED Results / Procedures / Treatments   Labs (all labs ordered are listed, but only abnormal results are displayed) Labs Reviewed  BASIC METABOLIC PANEL - Abnormal; Notable for the following components:      Result Value   CO2 20 (*)    Glucose, Bld 113 (*)    All other components within normal limits  CBC - Abnormal; Notable for the following components:   RDW 15.6 (*)    All other components within normal limits  RAPID URINE DRUG SCREEN, HOSP PERFORMED - Abnormal; Notable for the following components:   Opiates POSITIVE (*)    Cocaine POSITIVE (*)    Benzodiazepines POSITIVE (*)    All other components within normal limits  SARS CORONAVIRUS 2 BY RT PCR (HOSPITAL ORDER, PERFORMED IN [facility])  CULTURE, BLOOD (ROUTINE X 2)  CULTURE, BLOOD (ROUTINE X 2)  PROTIME-INR  LACTIC ACID, PLASMA  HIV ANTIBODY (ROUTINE TESTING W REFLEX)  TROPONIN I (HIGH SENSITIVITY)  TROPONIN I (HIGH SENSITIVITY)    EKG EKG Interpretation  Date/Time:  Monday August 05 2019 02:19:04 EDT Ventricular Rate:  75 PR Interval:  164 QRS Duration: 96 QT Interval:  430 QTC Calculation: 480 R Axis:   72 Text Interpretation: Normal sinus rhythm ST elevation, consider early repolarization, pericarditis, or injury Prolonged QT Abnormal ECG When compared with ECG of 11/15/2018, T wave abnormality, consider anterior ischemia HEART RATE has increased Confirmed by Dione Booze ([id number]) on 08/05/2019 2:35:27 AM   Radiology DG Chest 2 View  Result Date:  08/05/2019 CLINICAL DATA:  Chest pain, history of endocarditis EXAM: CHEST - 2 VIEW COMPARISON:  11/15/2018 FINDINGS: Cardiomediastinal contours and hilar structures are normal. Lungs are clear.  No effusion. Visualized skeletal structures on limited assessment are unremarkable. IMPRESSION: No acute cardiopulmonary disease. Electronically Signed   By: Donzetta Kohut M.D.   On: 08/05/2019 10:30    Procedures Procedures (including critical care time)  CRITICAL CARE Performed by: Canary Brim Haylyn Halberg Total critical care time: 35 minutes Critical care time was exclusive of separately billable procedures and treating other patients. Persistent Endocarditis requiring IV antibiotics and admission. Critical care was necessary to treat or prevent imminent or life-threatening deterioration. Critical care was time spent personally by me on the following activities: development of treatment plan with patient and/or surrogate as  well as nursing, discussions with consultants, evaluation of patient's response to treatment, examination of patient, obtaining history from patient or surrogate, ordering and performing treatments and interventions, ordering and review of laboratory studies, ordering and review of radiographic studies, pulse oximetry and re-evaluation of patient's condition.   Medications Ordered in ED Medications  enoxaparin (LOVENOX) injection 40 mg (has no administration in time range)  acetaminophen (TYLENOL) tablet 650 mg (has no administration in time range)    Or  acetaminophen (TYLENOL) suppository 650 mg (has no administration in time range)  ondansetron (ZOFRAN) tablet 4 mg (has no administration in time range)    Or  ondansetron (ZOFRAN) injection 4 mg (has no administration in time range)  albuterol (PROVENTIL) (2.5 MG/3ML) 0.083% nebulizer solution 2.5 mg (has no administration in time range)  gentamicin (GARAMYCIN) IVPB 60 mg (has no administration in time range)  ampicillin  (OMNIPEN) 2 g in sodium chloride 0.9 % 100 mL IVPB (0 g Intravenous Stopped 08/05/19 1521)  buprenorphine-naloxone (SUBOXONE) 8-2 mg per SL tablet 1 tablet (1 tablet Sublingual Given 08/05/19 1549)  sodium chloride flush (NS) 0.9 % injection 3 mL (3 mLs Intravenous Given 08/05/19 1023)  gentamicin (GARAMYCIN) IVPB 60 mg (0 mg Intravenous Stopped 08/05/19 1153)  ampicillin (OMNIPEN) 2 g in sodium chloride 0.9 % 100 mL IVPB (0 g Intravenous Stopped 08/05/19 1054)    ED Course  I have reviewed the triage vital signs and the nursing notes.  Pertinent labs & imaging results that were available during my care of the patient were reviewed by me and considered in my medical decision making (see chart for details).    MDM Rules/Calculators/A&P                          Timothy Christensen is a 24 y.o. male with a past medical history significant for recent admission at [facility] for endocarditis, history of IV drug abuse, who presents with continued chills, chest discomfort, shortness of breath, and needing IV antibiotics.  According to patient, he left the [facility] hospital system after several weeks of inpatient IV antibiotics or PICC line because he needed to be closer to home here in [place] or friend recent had a car accident.  He was given paperwork saying he needs to check into the hospital to get IV antibiotics started.  He reports that he has had gradually worsening chills again with some shortness of breath and some left-sided chest discomfort consistent with his prior endocarditis.  He denies actually having any fevers at home.  He denies nausea, vomiting, urinary or GI changes.  He presents for readmission for IV antibiotics.  On exam, patient does have a loud systolic murmur.  Lungs otherwise were clear and chest was tender to palpation.  Abdomen was nontender.  Good pulses in extremities.  Patient resting comfortably watching something on his phone.  Patient in no acute distress.  Vital signs reassuring on  arrival.  I had pharmacy investigate the patient's antibiotic regimen and she reports that he was on ampicillin and gentamicin which she will need to have restarted.  She was going to place the orders.  We will get repeat labs and blood cultures as well as a chest x-ray to make sure there is no pneumothorax or other new change.  Will call for admission for further management.  Patient may need ID evaluation during his admission for further antibiotic management.  11:20 AM Chest x-ray shows no acute cardiopulmonary disease.  Metabolic panel shows normal kidney function and troponin is negative initially.  CBC shows no leukocytosis or anemia.  Will call for admission for further IV antibiotics for known endocarditis.   Final Clinical Impression(s) / ED Diagnoses Final diagnoses:  Bacterial endocarditis, unspecified chronicity    Clinical Impression: 1. Bacterial endocarditis, unspecified chronicity     Disposition: Admit  This note was prepared with assistance of Dragon voice recognition software. Occasional wrong-word or sound-a-like substitutions may have occurred due to the inherent limitations of voice recognition software.     Halaina Vanduzer, Gwenyth Allegra, MD 08/05/19 1626

## 2019-08-05 NOTE — ED Notes (Signed)
1st Lactic Acid was normal. 2nd to be discontinued.

## 2019-08-06 ENCOUNTER — Inpatient Hospital Stay (HOSPITAL_COMMUNITY): Payer: Self-pay

## 2019-08-06 DIAGNOSIS — I058 Other rheumatic mitral valve diseases: Secondary | ICD-10-CM

## 2019-08-06 DIAGNOSIS — I340 Nonrheumatic mitral (valve) insufficiency: Secondary | ICD-10-CM

## 2019-08-06 LAB — ECHOCARDIOGRAM COMPLETE
Height: 72 in
Weight: 2698.43 oz

## 2019-08-06 LAB — CBC
HCT: 40.3 % (ref 39.0–52.0)
Hemoglobin: 13 g/dL (ref 13.0–17.0)
MCH: 27 pg (ref 26.0–34.0)
MCHC: 32.3 g/dL (ref 30.0–36.0)
MCV: 83.8 fL (ref 80.0–100.0)
Platelets: 229 K/uL (ref 150–400)
RBC: 4.81 MIL/uL (ref 4.22–5.81)
RDW: 15.4 % (ref 11.5–15.5)
WBC: 2.9 K/uL — ABNORMAL LOW (ref 4.0–10.5)
nRBC: 0 % (ref 0.0–0.2)

## 2019-08-06 LAB — BASIC METABOLIC PANEL WITH GFR
Anion gap: 12 (ref 5–15)
BUN: 16 mg/dL (ref 6–20)
CO2: 19 mmol/L — ABNORMAL LOW (ref 22–32)
Calcium: 9.1 mg/dL (ref 8.9–10.3)
Chloride: 108 mmol/L (ref 98–111)
Creatinine, Ser: 0.9 mg/dL (ref 0.61–1.24)
GFR calc Af Amer: >60 mL/min
GFR calc non Af Amer: >60 mL/min
Glucose, Bld: 91 mg/dL (ref 70–99)
Potassium: 3.7 mmol/L (ref 3.5–5.1)
Sodium: 139 mmol/L (ref 135–145)

## 2019-08-06 MED ORDER — DIAZEPAM 5 MG PO TABS
10.0000 mg | ORAL_TABLET | Freq: Two times a day (BID) | ORAL | Status: AC | PRN
  Administered 2019-08-06: 10 mg via ORAL
  Filled 2019-08-06: qty 2

## 2019-08-06 NOTE — Progress Notes (Signed)
Pt. Requesting f/u concerning home medications. On call for [facility] paged to make aware.

## 2019-08-06 NOTE — Plan of Care (Signed)
  Problem: Activity: Goal: Risk for activity intolerance will decrease Outcome: Progressing   Problem: Safety: Goal: Ability to remain free from injury will improve Outcome: Progressing   

## 2019-08-06 NOTE — Progress Notes (Signed)
  Echocardiogram 2D Echocardiogram has been performed.  Timothy Christensen 08/06/2019, 2:43 PM

## 2019-08-06 NOTE — Progress Notes (Signed)
Patient ID: Timothy Christensen, male   DOB: Oct 26, 1995, 26 y.o.   MRN: [id number]         [facility]  Date of Admission:  08/05/2019   Total days of antibiotics ?         ASSESSMENT: I am still waiting on records from [facility] to determine if he needs further antibiotic therapy for his enterococcal endocarditis.  PLAN: 1. Await records from [facility]  Principal Problem:   Endocarditis of mitral valve Active Problems:   Bacteremia due to Enterococcus   IVDU (intravenous drug user)/Opiates/Cocaine   Polysubstance abuse /Cocaine and Opiates   Tobacco abuse   Scheduled Meds: . buprenorphine-naloxone  1 tablet Sublingual TID  . enoxaparin (LOVENOX) injection  40 mg Subcutaneous Q24H   Continuous Infusions: . sodium chloride 250 mL (08/05/19 1954)  . ampicillin (OMNIPEN) IV 2 g (08/06/19 0914)  . gentamicin 60 mg (08/06/19 0941)   PRN Meds:.sodium chloride, acetaminophen **OR** acetaminophen, albuterol, ondansetron **OR** ondansetron (ZOFRAN) IV   SUBJECTIVE: He says that he needs to be back on his home medications of Valium and pain medication.  Review of Systems: Review of Systems  Constitutional: Negative for chills, diaphoresis and fever.  Respiratory: Negative for cough and shortness of breath.   Cardiovascular: Positive for chest pain.  Gastrointestinal: Negative for abdominal pain, diarrhea, nausea and vomiting.    No Known Allergies  OBJECTIVE: Vitals:   08/05/19 2351 08/06/19 0627 08/06/19 0736 08/06/19 0816  BP: 112/74 113/79 109/66   Pulse: (!) 59 61 (!) 55   Resp: 17 17 18    Temp: 98.2 F (36.8 C) 98.4 F (36.9 C) 98 F (36.7 C)   TempSrc: Oral Oral Oral   SpO2: 98% 99% 98%   Weight:    76.5 kg  Height:       Body mass index is 22.87 kg/m.  Physical Exam Constitutional:      Comments: He is resting quietly in bed watching a movie on his phone.  Cardiovascular:     Rate and Rhythm: Normal rate and regular rhythm.     Heart sounds:  Murmur heard.      Comments: No change in 3/6 holosystolic murmur. Pulmonary:     Effort: Pulmonary effort is normal.     Breath sounds: Normal breath sounds.  Musculoskeletal:        General: No swelling or tenderness.  Skin:    Findings: No rash.  Neurological:     General: No focal deficit present.     Lab Results Lab Results  Component Value Date   WBC 2.9 (L) 08/06/2019   HGB 13.0 08/06/2019   HCT 40.3 08/06/2019   MCV 83.8 08/06/2019   PLT 229 08/06/2019    Lab Results  Component Value Date   CREATININE 0.90 08/06/2019   BUN 16 08/06/2019   NA 139 08/06/2019   K 3.7 08/06/2019   CL 108 08/06/2019   CO2 19 (L) 08/06/2019    Lab Results  Component Value Date   ALT 19 07/07/2018   AST 16 07/07/2018   ALKPHOS 74 07/07/2018   BILITOT 0.3 07/07/2018     Microbiology: Recent Results (from the past 240 hour(s))  Blood culture (routine x 2)     Status: None (Preliminary result)   Collection Time: 08/05/19  9:48 AM   Specimen: BLOOD LEFT FOREARM  Result Value Ref Range Status   Specimen Description BLOOD LEFT FOREARM  Final   Special Requests   Final  BOTTLES DRAWN AEROBIC AND ANAEROBIC Blood Culture adequate volume   Culture   Final    NO GROWTH < 24 HOURS Performed at [facility], [place] [address], [place], [place] [address]    Report Status PENDING  Incomplete  SARS Coronavirus 2 by RT PCR (hospital order, performed in [facility] hospital lab) Nasopharyngeal Nasopharyngeal Swab     Status: None   Collection Time: 08/05/19  9:51 AM   Specimen: Nasopharyngeal Swab  Result Value Ref Range Status   SARS Coronavirus 2 NEGATIVE NEGATIVE Final    Comment: (NOTE) SARS-CoV-2 target nucleic acids are NOT DETECTED.  The SARS-CoV-2 RNA is generally detectable in upper and lower respiratory specimens during the acute phase of infection. The lowest concentration of SARS-CoV-2 viral copies this assay can detect is 250 copies / mL. A negative result does not  preclude SARS-CoV-2 infection and should not be used as the sole basis for treatment or other patient management decisions.  A negative result may occur with improper specimen collection / handling, submission of specimen other than nasopharyngeal swab, presence of viral mutation(s) within the areas targeted by this assay, and inadequate number of viral copies (<250 copies / mL). A negative result must be combined with clinical observations, patient history, and epidemiological information.  Fact Sheet for Patients:   StrictlyIdeas.no  Fact Sheet for Healthcare Providers: BankingDealers.co.za  This test is not yet approved or  cleared by the [place] FDA and has been authorized for detection and/or diagnosis of SARS-CoV-2 by FDA under an Emergency Use Authorization (EUA).  This EUA will remain in effect (meaning this test can be used) for the duration of the COVID-19 declaration under Section 564(b)(1) of the Act, 21 U.S.C. section 360bbb-3(b)(1), unless the authorization is terminated or revoked sooner.  Performed at [facility], [place] [address], [place], [place] [address]   Blood culture (routine x 2)     Status: None (Preliminary result)   Collection Time: 08/05/19 10:22 AM   Specimen: BLOOD RIGHT HAND  Result Value Ref Range Status   Specimen Description BLOOD RIGHT HAND  Final   Special Requests   Final    BOTTLES DRAWN AEROBIC AND ANAEROBIC Blood Culture adequate volume   Culture   Final    NO GROWTH < 24 HOURS Performed at [facility], [place] [address] [place], [place] [address]    Report Status PENDING  Incomplete    Michel Bickers, MD [facility] [phone] pager   [phone] cell 08/06/2019, 11:20 AM

## 2019-08-06 NOTE — Progress Notes (Signed)
Pts. [facility] records received via fax. Scanned to pts. Chart by NS. On call for [facility] paged to make aware.

## 2019-08-06 NOTE — Progress Notes (Signed)
PROGRESS NOTE    Timothy Christensen  QQV:[id number] DOB: Jan 06, 1996 DOA: 08/05/2019 PCP: Patient, No Pcp Per  Brief Narrative: per dr Tamala Julian is a 24 y.o. male with medical history significant of polysubstance abuse(IV drug use, cocaine, opiate, tobacco) who presents to the hospital with complaints of chest pain.  He reports that he just left [facility] hospital 2 days ago after being there for the last 4 weeks receiving IV antibiotics for Enterococcus faecalis bacteremia and mitral valve endocarditis with severe mitral regurgitation on echocardiogram.  Patient had 2 more weeks left of IV antibiotics per discharge paperwork and thereafter was ultimately recommended to need surgical correction.  States that he had to leave the hospital due to multiple reasons including someone getting involved in a car accident and being closer to his home.  He complains of having continued severe left-sided chest pain that he describes as sharp and pressure-like in nature.  Associated symptoms of shortness of breath and generalized malaise.  After leaving the hospital patient did admit to snorting cocaine.  Denies having any fevers, leg swelling, loss of consciousness, nausea, vomiting, or dysuria.  ED Course: Upon admission to the emergency department patient was noted to have stable vital signs.  Labs are unremarkable including 2 - troponins.  Chest x-ray showed no acute abnormalities.  Blood cultures were obtained.  Previous antibiotics given for confirmed by pharmacy and restarted on ampicillin and gentamicin.  TRH called to admit.  Assessment & Plan:   Principal Problem:   Endocarditis of mitral valve Active Problems:   IVDU (intravenous drug user)/Opiates/Cocaine   Polysubstance abuse /Cocaine and Opiates   Tobacco abuse   Bacteremia due to Enterococcus   #1 mitral valve endocarditis still waiting on records from [facility].  Order placed again to get records from [facility].  For now continue ampicillin and gentamicin.   Apparently patient received antibiotics for 4 weeks, recommended 6 weeks and he left the hospital after 4 weeks.  Appreciate ID input.  #2 polysubstance abuse actively using cocaine.  Urine drug screen positive for cocaine opiates and benzos.    Estimated body mass index is 22.87 kg/m as calculated from the following:   Height as of this encounter: 6' (1.829 m).   Weight as of this encounter: 76.5 kg.  DVT prophylaxis: Lovenox  code Status full code Family Communication: None at bedside Disposition Plan:  Status is: Inpatient  Dispo: The patient is from: Home              Anticipated d/c is to: Home              Anticipated d/c date is: Unknown              Patient currently is not medically stable to d/c.    Consultants: Infectious disease  Procedures: None Antimicrobials: Gentamicin ampicillin  Subjective: Patient sleeping acute distress  Objective: Vitals:   08/05/19 2351 08/06/19 0627 08/06/19 0736 08/06/19 0816  BP: 112/74 113/79 109/66   Pulse: (!) 59 61 (!) 55   Resp: 17 17 18    Temp: 98.2 F (36.8 C) 98.4 F (36.9 C) 98 F (36.7 C)   TempSrc: Oral Oral Oral   SpO2: 98% 99% 98%   Weight:    76.5 kg  Height:        Intake/Output Summary (Last 24 hours) at 08/06/2019 1355 Last data filed at 08/06/2019 1000 Gross per 24 hour  Intake 1359.27 ml  Output 600 ml  Net 759.27 ml  Filed Weights   08/05/19 1024 08/05/19 1835 08/06/19 0816  Weight: 72.6 kg 77.2 kg 76.5 kg    Examination:  General exam: Appears calm and comfortable  Respiratory system: Clear to auscultation. Respiratory effort normal. Cardiovascular system: S1 & S2 heard, RRR. No JVD, systolic murmurs, rubs, gallops or clicks. No pedal edema. Gastrointestinal system: Abdomen is nondistended, soft and nontender. No organomegaly or masses felt. Normal bowel sounds heard. Central nervous system: Alert and oriented. No focal neurological deficits. Extremities: Symmetric 5 x 5 power. Skin: No  rashes, lesions or ulcers Psychiatry: Judgement and insight appear normal. Mood & affect appropriate.     Data Reviewed: I have personally reviewed following labs and imaging studies  CBC: Recent Labs  Lab 08/05/19 0233 08/06/19 0551  WBC 4.1 2.9*  HGB 13.5 13.0  HCT 42.5 40.3  MCV 85.7 83.8  PLT 255 229   Basic Metabolic Panel: Recent Labs  Lab 08/05/19 0233 08/06/19 0551  NA 139 139  K 4.2 3.7  CL 107 108  CO2 20* 19*  GLUCOSE 113* 91  BUN 18 16  CREATININE 0.86 0.90  CALCIUM 9.2 9.1   GFR: Estimated Creatinine Clearance: 134.6 mL/min (by C-G formula based on SCr of 0.9 mg/dL). Liver Function Tests: No results for input(s): AST, ALT, ALKPHOS, BILITOT, PROT, ALBUMIN in the last 168 hours. No results for input(s): LIPASE, AMYLASE in the last 168 hours. No results for input(s): AMMONIA in the last 168 hours. Coagulation Profile: Recent Labs  Lab 08/05/19 0233  INR 1.1   Cardiac Enzymes: No results for input(s): CKTOTAL, CKMB, CKMBINDEX, TROPONINI in the last 168 hours. BNP (last 3 results) No results for input(s): PROBNP in the last 8760 hours. HbA1C: No results for input(s): HGBA1C in the last 72 hours. CBG: No results for input(s): GLUCAP in the last 168 hours. Lipid Profile: No results for input(s): CHOL, HDL, LDLCALC, TRIG, CHOLHDL, LDLDIRECT in the last 72 hours. Thyroid Function Tests: No results for input(s): TSH, T4TOTAL, FREET4, T3FREE, THYROIDAB in the last 72 hours. Anemia Panel: No results for input(s): VITAMINB12, FOLATE, FERRITIN, TIBC, IRON, RETICCTPCT in the last 72 hours. Sepsis Labs: Recent Labs  Lab 08/05/19 1013  LATICACIDVEN 1.8    Recent Results (from the past 240 hour(s))  Blood culture (routine x 2)     Status: None (Preliminary result)   Collection Time: 08/05/19  9:48 AM   Specimen: BLOOD LEFT FOREARM  Result Value Ref Range Status   Specimen Description BLOOD LEFT FOREARM  Final   Special Requests   Final    BOTTLES  DRAWN AEROBIC AND ANAEROBIC Blood Culture adequate volume   Culture   Final    NO GROWTH 1 DAY Performed at [facility], [address] [place], [place] [address]    Report Status PENDING  Incomplete  SARS Coronavirus 2 by RT PCR (hospital order, performed in [facility] hospital lab) Nasopharyngeal Nasopharyngeal Swab     Status: None   Collection Time: 08/05/19  9:51 AM   Specimen: Nasopharyngeal Swab  Result Value Ref Range Status   SARS Coronavirus 2 NEGATIVE NEGATIVE Final    Comment: (NOTE) SARS-CoV-2 target nucleic acids are NOT DETECTED.  The SARS-CoV-2 RNA is generally detectable in upper and lower respiratory specimens during the acute phase of infection. The lowest concentration of SARS-CoV-2 viral copies this assay can detect is 250 copies / mL. A negative result does not preclude SARS-CoV-2 infection and should not be used as the sole basis  for treatment or other patient management decisions.  A negative result may occur with improper specimen collection / handling, submission of specimen other than nasopharyngeal swab, presence of viral mutation(s) within the areas targeted by this assay, and inadequate number of viral copies (<250 copies / mL). A negative result must be combined with clinical observations, patient history, and epidemiological information.  Fact Sheet for Patients:   BoilerBrush.com.cy  Fact Sheet for Healthcare Providers: https://pope.com/  This test is not yet approved or  cleared by the [place] FDA and has been authorized for detection and/or diagnosis of SARS-CoV-2 by FDA under an Emergency Use Authorization (EUA).  This EUA will remain in effect (meaning this test can be used) for the duration of the COVID-19 declaration under Section 564(b)(1) of the Act, 21 U.S.C. section 360bbb-3(b)(1), unless the authorization is terminated or revoked sooner.  Performed at [facility]  Lab, [address] [place], [place] [address]   Blood culture (routine x 2)     Status: None (Preliminary result)   Collection Time: 08/05/19 10:22 AM   Specimen: BLOOD RIGHT HAND  Result Value Ref Range Status   Specimen Description BLOOD RIGHT HAND  Final   Special Requests   Final    BOTTLES DRAWN AEROBIC AND ANAEROBIC Blood Culture adequate volume   Culture   Final    NO GROWTH 1 DAY Performed at [facility], [address] [place], [place] [address]    Report Status PENDING  Incomplete         Radiology Studies: DG Chest 2 View  Result Date: 08/05/2019 CLINICAL DATA:  Chest pain, history of endocarditis EXAM: CHEST - 2 VIEW COMPARISON:  11/15/2018 FINDINGS: Cardiomediastinal contours and hilar structures are normal. Lungs are clear.  No effusion. Visualized skeletal structures on limited assessment are unremarkable. IMPRESSION: No acute cardiopulmonary disease. Electronically Signed   By: Donzetta Kohut M.D.   On: 08/05/2019 10:30        Scheduled Meds: . buprenorphine-naloxone  1 tablet Sublingual TID  . enoxaparin (LOVENOX) injection  40 mg Subcutaneous Q24H   Continuous Infusions: . sodium chloride 250 mL (08/05/19 1954)  . ampicillin (OMNIPEN) IV 2 g (08/06/19 1351)  . gentamicin 60 mg (08/06/19 0941)     LOS: 1 day     Alwyn Ren, MD  08/06/2019, 1:55 PM

## 2019-08-06 NOTE — Progress Notes (Signed)
Pt. Requesting home meds,  On call MD 7p-7a made aware. RN told to f/u with day team. On coming RN made aware.

## 2019-08-06 NOTE — Progress Notes (Signed)
Pt refuses tele box. Notified Dr Jerolyn Center

## 2019-08-07 ENCOUNTER — Encounter (HOSPITAL_COMMUNITY): Payer: Self-pay | Admitting: Internal Medicine

## 2019-08-07 MED ORDER — GABAPENTIN 400 MG PO CAPS
400.0000 mg | ORAL_CAPSULE | Freq: Three times a day (TID) | ORAL | Status: DC
  Administered 2019-08-07 – 2019-08-08 (×4): 400 mg via ORAL
  Filled 2019-08-07 (×4): qty 1

## 2019-08-07 MED ORDER — IBUPROFEN 600 MG PO TABS
800.0000 mg | ORAL_TABLET | Freq: Three times a day (TID) | ORAL | Status: DC
  Administered 2019-08-07 – 2019-08-08 (×4): 800 mg via ORAL
  Filled 2019-08-07 (×4): qty 1

## 2019-08-07 MED ORDER — MELATONIN 3 MG PO TABS
6.0000 mg | ORAL_TABLET | Freq: Every day | ORAL | Status: DC
  Administered 2019-08-07: 6 mg via ORAL
  Filled 2019-08-07: qty 2

## 2019-08-07 MED ORDER — DIAZEPAM 5 MG PO TABS
25.0000 mg | ORAL_TABLET | Freq: Three times a day (TID) | ORAL | Status: DC
  Administered 2019-08-07 – 2019-08-08 (×4): 25 mg via ORAL
  Filled 2019-08-07 (×4): qty 5

## 2019-08-07 MED ORDER — TOPIRAMATE 25 MG PO TABS
25.0000 mg | ORAL_TABLET | Freq: Two times a day (BID) | ORAL | Status: DC
  Administered 2019-08-07 – 2019-08-08 (×3): 25 mg via ORAL
  Filled 2019-08-07 (×3): qty 1

## 2019-08-07 NOTE — Progress Notes (Signed)
Dr.Matthews in reviewed hard paper copy chart from other hospital patient asking her to order valium and pain meds from transferring hospital she reported would talk with patient regarding his request. Dr.Matthews asked I message Dr.Campbell to review hard chart copy records I sent him a message regarding her request.

## 2019-08-07 NOTE — Plan of Care (Signed)

## 2019-08-07 NOTE — Progress Notes (Signed)
Patient ID: Timothy Christensen, male   DOB: [birth year], 24 y.o.   MRN: [id number]         [facility]  Date of Admission:  08/05/2019   Total days of antibiotics 27         ASSESSMENT: I recommend continuing ampicillin and gentamicin for now.  Admission blood cultures here were negative.  Echocardiogram shows moderate mitral regurgitation.  A mitral valve vegetation "could not be ruled out".  PLAN: 1. Continue ampicillin and gentamicin for now  Principal Problem:   Endocarditis of mitral valve Active Problems:   Bacteremia due to Enterococcus   IVDU (intravenous drug user)/Opiates/Cocaine   Polysubstance abuse /Cocaine and Opiates   Tobacco abuse   Scheduled Meds: . buprenorphine-naloxone  1 tablet Sublingual TID  . diazepam  25 mg Oral TID  . enoxaparin (LOVENOX) injection  40 mg Subcutaneous Q24H  . gabapentin  400 mg Oral TID  . ibuprofen  800 mg Oral TID  . melatonin  6 mg Oral QHS  . topiramate  25 mg Oral BID   Continuous Infusions: . sodium chloride 250 mL (08/05/19 1954)  . ampicillin (OMNIPEN) IV 2 g (08/07/19 0908)  . gentamicin 60 mg (08/07/19 0011)   PRN Meds:.sodium chloride, acetaminophen **OR** acetaminophen, albuterol, ondansetron **OR** ondansetron (ZOFRAN) IV   SUBJECTIVE: He says that he is still having intermittent chest pain and now has mid abdominal pain.  His last bowel movement was 3 days ago.  He says that he does not recall how many times he has been admitted to [facility] recently.  I did review partial records from [facility].  His enterococcal mitral valve endocarditis was first diagnosed in March.  His records do not indicate how many times he has been admitted and for what length of time.  His last admission was on 07/11/2019.  He was treated with ampicillin and ceftriaxone but developed neutropenia and the ceftriaxone was changed to gentamicin.  Notes also indicate that that he was using cocaine and other drugs while in  the hospital.  He would frequently leave the floor unannounced and eventually left AMA again on 08/03/2019.  Review of Systems: Review of Systems  Constitutional: Negative for chills, diaphoresis and fever.  Respiratory: Negative for cough and shortness of breath.   Cardiovascular: Positive for chest pain.  Gastrointestinal: Positive for abdominal pain and constipation. Negative for diarrhea, nausea and vomiting.  Skin: Negative for rash.    No Known Allergies  OBJECTIVE: Vitals:   08/06/19 0816 08/06/19 1619 08/07/19 0451 08/07/19 0615  BP:  118/75 116/85   Pulse:  (!) 59 62   Resp:  18 20   Temp:  98.4 F (36.9 C) 98.1 F (36.7 C)   TempSrc:  Oral Oral   SpO2:  99% 99%   Weight: 76.5 kg   76.1 kg  Height:       Body mass index is 22.75 kg/m.  Physical Exam  Lab Results Lab Results  Component Value Date   WBC 2.9 (L) 08/06/2019   HGB 13.0 08/06/2019   HCT 40.3 08/06/2019   MCV 83.8 08/06/2019   PLT 229 08/06/2019    Lab Results  Component Value Date   CREATININE 0.90 08/06/2019   BUN 16 08/06/2019   NA 139 08/06/2019   K 3.7 08/06/2019   CL 108 08/06/2019   CO2 19 (L) 08/06/2019    Lab Results  Component Value Date   ALT 19 07/07/2018   AST 16  07/07/2018   ALKPHOS 74 07/07/2018   BILITOT 0.3 07/07/2018     Microbiology: Recent Results (from the past 240 hour(s))  Blood culture (routine x 2)     Status: None (Preliminary result)   Collection Time: 08/05/19  9:48 AM   Specimen: BLOOD LEFT FOREARM  Result Value Ref Range Status   Specimen Description BLOOD LEFT FOREARM  Final   Special Requests   Final    BOTTLES DRAWN AEROBIC AND ANAEROBIC Blood Culture adequate volume   Culture   Final    NO GROWTH 2 DAYS Performed at [facility], [address], [place], [place] [address]    Report Status PENDING  Incomplete  SARS Coronavirus 2 by RT PCR (hospital order, performed in [facility] hospital lab) Nasopharyngeal Nasopharyngeal Swab     Status:  None   Collection Time: 08/05/19  9:51 AM   Specimen: Nasopharyngeal Swab  Result Value Ref Range Status   SARS Coronavirus 2 NEGATIVE NEGATIVE Final    Comment: (NOTE) SARS-CoV-2 target nucleic acids are NOT DETECTED.  The SARS-CoV-2 RNA is generally detectable in upper and lower respiratory specimens during the acute phase of infection. The lowest concentration of SARS-CoV-2 viral copies this assay can detect is 250 copies / mL. A negative result does not preclude SARS-CoV-2 infection and should not be used as the sole basis for treatment or other patient management decisions.  A negative result may occur with improper specimen collection / handling, submission of specimen other than nasopharyngeal swab, presence of viral mutation(s) within the areas targeted by this assay, and inadequate number of viral copies (<250 copies / mL). A negative result must be combined with clinical observations, patient history, and epidemiological information.  Fact Sheet for Patients:   StrictlyIdeas.no  Fact Sheet for Healthcare Providers: BankingDealers.co.za  This test is not yet approved or  cleared by the [place] FDA and has been authorized for detection and/or diagnosis of SARS-CoV-2 by FDA under an Emergency Use Authorization (EUA).  This EUA will remain in effect (meaning this test can be used) for the duration of the COVID-19 declaration under Section 564(b)(1) of the Act, 21 U.S.C. section 360bbb-3(b)(1), unless the authorization is terminated or revoked sooner.  Performed at [facility], [address], [place], [place] [address]   Blood culture (routine x 2)     Status: None (Preliminary result)   Collection Time: 08/05/19 10:22 AM   Specimen: BLOOD RIGHT HAND  Result Value Ref Range Status   Specimen Description BLOOD RIGHT HAND  Final   Special Requests   Final    BOTTLES DRAWN AEROBIC AND ANAEROBIC Blood Culture  adequate volume   Culture   Final    NO GROWTH 2 DAYS Performed at [facility], [place] [address], [place], [place] [address]    Report Status PENDING  Incomplete    Michel Bickers, MD [facility] for [facility] [place] [facility] [phone] pager   [phone] cell 08/07/2019, 10:02 AM

## 2019-08-07 NOTE — Progress Notes (Signed)
PROGRESS NOTE    Timothy Christensen  HWE:[id number] DOB: [birth year] DOA: 08/05/2019 PCP: Patient, No Pcp Per  Brief Narrative: per dr Timothy Christensen is a 24 y.o. male with medical history significant of polysubstance abuse(IV drug use, cocaine, opiate, tobacco) who presents to the hospital with complaints of chest pain.  He reports that he just left [facility] hospital 2 days ago after being there for the last 4 weeks receiving IV antibiotics for Enterococcus faecalis bacteremia and mitral valve endocarditis with severe mitral regurgitation on echocardiogram.  Patient had 2 more weeks left of IV antibiotics per discharge paperwork and thereafter was ultimately recommended to need surgical correction.  States that he had to leave the hospital due to multiple reasons including someone getting involved in a car accident and being closer to his home.  He complains of having continued severe left-sided chest pain that he describes as sharp and pressure-like in nature.  Associated symptoms of shortness of breath and generalized malaise.  After leaving the hospital patient did admit to snorting cocaine.  Denies having any fevers, leg swelling, loss of consciousness, nausea, vomiting, or dysuria.  ED Course: Upon admission to the emergency department patient was noted to have stable vital signs.  Labs are unremarkable including 2 - troponins.  Chest x-ray showed no acute abnormalities.  Blood cultures were obtained.  Previous antibiotics given for confirmed by pharmacy and restarted on ampicillin and gentamicin.  TRH called to admit.  08/07/2019 records from outside hospital reviewed.  During his last hospital stay there the scheduled meds he has been receiving of the following. Tylenol 1000 mg 3 times daily Suboxone sublingual 3 times daily Valium 25 mg 3 times daily Gabapentin 400 mg 3 times daily Ibuprofen 800 mg 3 times daily Melatonin 6 mg nightly Seroquel 25 mg 4 times a day which the patient does not want  it Topiramate 25 mg twice daily Ampicillin 2 g IV every 4  Gentamicin 60 mg IV every 12 He reports his prescriptions are given to him by Dr. Robin Martinique [facility]. He also reported he was getting oxycodone during the hospital stay along with the above medications  Assessment & Plan:   Principal Problem:   Endocarditis of mitral valve Active Problems:   IVDU (intravenous drug user)/Opiates/Cocaine   Polysubstance abuse /Cocaine and Opiates   Tobacco abuse   Bacteremia due to Enterococcus   #1 mitral valve endocarditis still waiting on records from [facility].  Order placed again to get records from [facility].  For now continue ampicillin and gentamicin.  Apparently patient received antibiotics for 4 weeks, recommended 6 weeks and he left the hospital after 4 weeks.  Appreciate ID input.  #2 polysubstance abuse actively using cocaine.  Urine drug screen positive for cocaine opiates and benzos. I have restarted diazepam gabapentin ibuprofen topiramate.    Estimated body mass index is 22.75 kg/m as calculated from the following:   Height as of this encounter: 6' (1.829 m).   Weight as of this encounter: 76.1 kg.  DVT prophylaxis: Lovenox  code Status full code Family Communication: None at bedside Disposition Plan:  Status is: Inpatient  Dispo: The patient is from: Home              Anticipated d/c is to: Home              Anticipated d/c date is: Unknown              Patient currently is not medically stable to  d/c.  Admitted with infective endocarditis needs IV antibiotics    Consultants: Infectious disease  Procedures: None Antimicrobials: Gentamicin ampicillin  Subjective: Patient sleeping acute distress, requesting medications that he was getting at the outside hospital to be restarted requesting for Valium.  Objective: Vitals:   08/06/19 0816 08/06/19 1619 08/07/19 0451 08/07/19 0615  BP:  118/75 116/85   Pulse:  (!) 59 62   Resp:  18 20   Temp:  98.4 F (36.9 C) 98.1  F (36.7 C)   TempSrc:  Oral Oral   SpO2:  99% 99%   Weight: 76.5 kg   76.1 kg  Height:        Intake/Output Summary (Last 24 hours) at 08/07/2019 1344 Last data filed at 08/07/2019 0900 Gross per 24 hour  Intake 480 ml  Output 900 ml  Net -420 ml   Filed Weights   08/05/19 1835 08/06/19 0816 08/07/19 0615  Weight: 77.2 kg 76.5 kg 76.1 kg    Examination:  General exam: Appears calm and comfortable  Respiratory system: Clear to auscultation. Respiratory effort normal. Cardiovascular system: S1 & S2 heard, RRR. No JVD, systolic murmurs, rubs, gallops or clicks. No pedal edema. Gastrointestinal system: Abdomen is nondistended, soft and nontender. No organomegaly or masses felt. Normal bowel sounds heard. Central nervous system: Alert and oriented. No focal neurological deficits. Extremities: Symmetric 5 x 5 power. Skin: No rashes, lesions or ulcers Psychiatry: Judgement and insight appear normal. Mood & affect appropriate.     Data Reviewed: I have personally reviewed following labs and imaging studies  CBC: Recent Labs  Lab 08/05/19 0233 08/06/19 0551  WBC 4.1 2.9*  HGB 13.5 13.0  HCT 42.5 40.3  MCV 85.7 83.8  PLT 255 229   Basic Metabolic Panel: Recent Labs  Lab 08/05/19 0233 08/06/19 0551  NA 139 139  K 4.2 3.7  CL 107 108  CO2 20* 19*  GLUCOSE 113* 91  BUN 18 16  CREATININE 0.86 0.90  CALCIUM 9.2 9.1   GFR: Estimated Creatinine Clearance: 133.9 mL/min (by C-G formula based on SCr of 0.9 mg/dL). Liver Function Tests: No results for input(s): AST, ALT, ALKPHOS, BILITOT, PROT, ALBUMIN in the last 168 hours. No results for input(s): LIPASE, AMYLASE in the last 168 hours. No results for input(s): AMMONIA in the last 168 hours. Coagulation Profile: Recent Labs  Lab 08/05/19 0233  INR 1.1   Cardiac Enzymes: No results for input(s): CKTOTAL, CKMB, CKMBINDEX, TROPONINI in the last 168 hours. BNP (last 3 results) No results for input(s): PROBNP in the  last 8760 hours. HbA1C: No results for input(s): HGBA1C in the last 72 hours. CBG: No results for input(s): GLUCAP in the last 168 hours. Lipid Profile: No results for input(s): CHOL, HDL, LDLCALC, TRIG, CHOLHDL, LDLDIRECT in the last 72 hours. Thyroid Function Tests: No results for input(s): TSH, T4TOTAL, FREET4, T3FREE, THYROIDAB in the last 72 hours. Anemia Panel: No results for input(s): VITAMINB12, FOLATE, FERRITIN, TIBC, IRON, RETICCTPCT in the last 72 hours. Sepsis Labs: Recent Labs  Lab 08/05/19 1013  LATICACIDVEN 1.8    Recent Results (from the past 240 hour(s))  Blood culture (routine x 2)     Status: None (Preliminary result)   Collection Time: 08/05/19  9:48 AM   Specimen: BLOOD LEFT FOREARM  Result Value Ref Range Status   Specimen Description BLOOD LEFT FOREARM  Final   Special Requests   Final    BOTTLES DRAWN AEROBIC AND ANAEROBIC Blood Culture adequate volume  Culture   Final    NO GROWTH 2 DAYS Performed at [facility], [address], [place], [place] [address]    Report Status PENDING  Incomplete  SARS Coronavirus 2 by RT PCR (hospital order, performed in [facility] hospital lab) Nasopharyngeal Nasopharyngeal Swab     Status: None   Collection Time: 08/05/19  9:51 AM   Specimen: Nasopharyngeal Swab  Result Value Ref Range Status   SARS Coronavirus 2 NEGATIVE NEGATIVE Final    Comment: (NOTE) SARS-CoV-2 target nucleic acids are NOT DETECTED.  The SARS-CoV-2 RNA is generally detectable in upper and lower respiratory specimens during the acute phase of infection. The lowest concentration of SARS-CoV-2 viral copies this assay can detect is 250 copies / mL. A negative result does not preclude SARS-CoV-2 infection and should not be used as the sole basis for treatment or other patient management decisions.  A negative result may occur with improper specimen collection / handling, submission of specimen other than nasopharyngeal swab, presence of  viral mutation(s) within the areas targeted by this assay, and inadequate number of viral copies (<250 copies / mL). A negative result must be combined with clinical observations, patient history, and epidemiological information.  Fact Sheet for Patients:   BoilerBrush.com.cy  Fact Sheet for Healthcare Providers: https://pope.com/  This test is not yet approved or  cleared by the [place] FDA and has been authorized for detection and/or diagnosis of SARS-CoV-2 by FDA under an Emergency Use Authorization (EUA).  This EUA will remain in effect (meaning this test can be used) for the duration of the COVID-19 declaration under Section 564(b)(1) of the Act, 21 U.S.C. section 360bbb-3(b)(1), unless the authorization is terminated or revoked sooner.  Performed at [facility], [address] [place], [place] [address]   Blood culture (routine x 2)     Status: None (Preliminary result)   Collection Time: 08/05/19 10:22 AM   Specimen: BLOOD RIGHT HAND  Result Value Ref Range Status   Specimen Description BLOOD RIGHT HAND  Final   Special Requests   Final    BOTTLES DRAWN AEROBIC AND ANAEROBIC Blood Culture adequate volume   Culture   Final    NO GROWTH 2 DAYS Performed at [facility], [address] [place], [place] [address]    Report Status PENDING  Incomplete         Radiology Studies: ECHOCARDIOGRAM COMPLETE  Result Date: 08/06/2019    ECHOCARDIOGRAM REPORT   Patient Name:   HELIODORO DOMAGALSKI Date of Exam: 08/06/2019 Medical Rec #:  [id number]   Height:       72.0 in Accession #:    [id number]  Weight:       168.7 lb Date of Birth:  [birth year]    BSA:          1.982 m Patient Age:    24 years    BP:           109/66 mmHg Patient Gender: M           HR:           58 bpm. Exam Location:  Inpatient Procedure: 2D Echo, 3D Echo, Cardiac Doppler and Color Doppler Indications:    Endocarditis I38  History:        Patient has no  prior history of Echocardiogram examinations.  Sonographer:    Tiffany Dance Referring Phys: [id number] RONDELL A SMITH IMPRESSIONS  1. Left ventricular ejection fraction, by estimation, is  55 to 60%. The left ventricle has normal function. The left ventricle has no regional wall motion abnormalities. Left ventricular diastolic parameters were normal.  2. Right ventricular systolic function is normal. The right ventricular size is normal.  3. Left atrial size was moderately dilated.  4. Cannot rule out vegetatoin on mitral valve on 3D images.     . The mitral valve is normal in structure. Moderate mitral valve regurgitation. No evidence of mitral stenosis.  5. The aortic valve is normal in structure. Aortic valve regurgitation is not visualized. No aortic stenosis is present.  6. There is mild dilatation at the level of the sinuses of Valsalva measuring 39 mm.  7. The inferior vena cava is normal in size with greater than 50% respiratory variability, suggesting right atrial pressure of 3 mmHg. Conclusion(s)/Recommendation(s): Cannot rule out mitral valvular vegetations on this transthoracic echocardiogram. Would recommend a transesophageal echocardiogram to exclude infective endocarditis if clinically indicated. FINDINGS  Left Ventricle: Left ventricular ejection fraction, by estimation, is 55 to 60%. The left ventricle has normal function. The left ventricle has no regional wall motion abnormalities. The left ventricular internal cavity size was normal in size. There is  no left ventricular hypertrophy. Left ventricular diastolic parameters were normal. Normal left ventricular filling pressure. Right Ventricle: The right ventricular size is normal. No increase in right ventricular wall thickness. Right ventricular systolic function is normal. Left Atrium: Left atrial size was moderately dilated. Right Atrium: Right atrial size was normal in size. Pericardium: There is no evidence of pericardial effusion. Mitral Valve:  Cannot rule out vegetatoin on mitral valve on 3D images. The mitral valve is normal in structure. Normal mobility of the mitral valve leaflets. Moderate mitral valve regurgitation. No evidence of mitral valve stenosis. Tricuspid Valve: The tricuspid valve is normal in structure. Tricuspid valve regurgitation is trivial. No evidence of tricuspid stenosis. Aortic Valve: The aortic valve is normal in structure. Aortic valve regurgitation is not visualized. No aortic stenosis is present. Pulmonic Valve: The pulmonic valve was normal in structure. Pulmonic valve regurgitation is not visualized. No evidence of pulmonic stenosis. Aorta: The aortic root is normal in size and structure. There is mild dilatation at the level of the sinuses of Valsalva measuring 39 mm. Venous: The inferior vena cava is normal in size with greater than 50% respiratory variability, suggesting right atrial pressure of 3 mmHg. IAS/Shunts: No atrial level shunt detected by color flow Doppler.  LEFT VENTRICLE PLAX 2D LVIDd:         5.40 cm  Diastology LVIDs:         4.40 cm  LV e' lateral:   14.90 cm/s LV PW:         1.10 cm  LV E/e' lateral: 7.0 LV IVS:        1.00 cm  LV e' medial:    9.03 cm/s LVOT diam:     2.50 cm  LV E/e' medial:  11.5 LV SV:         81 LV SV Index:   41 LVOT Area:     4.91 cm  RIGHT VENTRICLE RV Basal diam:  3.50 cm RV Mid diam:    2.00 cm RV S prime:     11.00 cm/s TAPSE (M-mode): 2.1 cm LEFT ATRIUM             Index       RIGHT ATRIUM           Index LA diam:  4.30 cm 2.17 cm/m  RA Area:     17.60 cm LA Vol (A2C):   79.2 ml 39.97 ml/m RA Volume:   46.60 ml  23.52 ml/m LA Vol (A4C):   86.1 ml 43.45 ml/m LA Biplane Vol: 86.8 ml 43.80 ml/m  AORTIC VALVE LVOT Vmax:   79.10 cm/s LVOT Vmean:  48.900 cm/s LVOT VTI:    0.166 m  AORTA Ao Root diam: 3.90 cm Ao Asc diam:  3.10 cm MITRAL VALVE MV Area (PHT): 2.42 cm     SHUNTS MV Decel Time: 313 msec     Systemic VTI:  0.17 m MV E velocity: 104.00 cm/s  Systemic Diam:  2.50 cm MV A velocity: 39.80 cm/s MV E/A ratio:  2.61 Armanda Magic MD Electronically signed by Armanda Magic MD Signature Date/Time: 08/06/2019/3:17:54 PM    Final         Scheduled Meds: . buprenorphine-naloxone  1 tablet Sublingual TID  . diazepam  25 mg Oral TID  . enoxaparin (LOVENOX) injection  40 mg Subcutaneous Q24H  . gabapentin  400 mg Oral TID  . ibuprofen  800 mg Oral TID  . melatonin  6 mg Oral QHS  . topiramate  25 mg Oral BID   Continuous Infusions: . sodium chloride 250 mL (08/05/19 1954)  . ampicillin (OMNIPEN) IV 2 g (08/07/19 0908)  . gentamicin 60 mg (08/07/19 1137)     LOS: 2 days     Alwyn Ren, MD  08/07/2019, 1:44 PM

## 2019-08-07 NOTE — Progress Notes (Signed)
Dr.Campbell in see patient aware of new records to review patient  Asking again for valium and pain meds reporting lower abdominal pain that hurts even at rest he denies nausea. Writer went to obtain vs he refused them reported just needed his pain meds. Writer messaged Dr.Matthews again regarding his request await response. Dr.Campbell reported not ordering additional pain meds for patient to notify Dr.Matthews of request-I have notified her.

## 2019-08-07 NOTE — TOC Initial Note (Signed)
Transition of Care [facility]) - Initial/Assessment Note    Patient Details  Name: Timothy Christensen MRN: [id number] Date of Birth: [birth year]  [facility] CM/SW Contact:    Shade Flood, LCSW Phone Number: 08/07/2019, 12:53 PM  Clinical Narrative:                  Received consult for SA resource needs for pt. Reviewed pt's record and discussed pt status with RN in Progression. Spoke with pt by phone today to assess. Pt states that he lives in [place] and plans to return there at dc. Pt is independent in ADLs at home. Discussed AODA treatment resources with pt who states that he is connected with the [facility] in [place] and he does not feel that he needs any additional resources at this time. Discussed Suboxone treatment and pt states that is available through the [facility].   Pt had questions on applying for "temporary disability" due to needing heart surgery. Discussed how to access [place] for [facility] Disability application and also Social Security for Disability application. Pt stated he would follow up.  No other TOC needs identified at this time. Pt aware CSW team available throughout pt's stay if needs arise.  Expected Discharge Plan: Home/Self Care Barriers to Discharge: Active Substance Use with PICC Line   Patient Goals and CMS Choice        Expected Discharge Plan and Services Expected Discharge Plan: Home/Self Care In-house Referral: Clinical Social Work     Living arrangements for the past 2 months: Apartment                                      Prior Living Arrangements/Services Living arrangements for the past 2 months: Apartment Lives with:: Self Patient language and need for interpreter reviewed:: Yes Do you feel safe going back to the place where you live?: Yes      Need for Family Participation in Patient Care: No (Comment) Care giver support system in place?: No (comment)   Criminal Activity/Legal Involvement  Pertinent to Current Situation/Hospitalization: No - Comment as needed  Activities of Daily Living Home Assistive Devices/Equipment: None ADL Screening (condition at time of admission) Patient's cognitive ability adequate to safely complete daily activities?: Yes Is the patient deaf or have difficulty hearing?: No Does the patient have difficulty seeing, even when wearing glasses/contacts?: No Does the patient have difficulty concentrating, remembering, or making decisions?: No Patient able to express need for assistance with ADLs?: Yes Does the patient have difficulty dressing or bathing?: No Independently performs ADLs?: Yes (appropriate for developmental age) Does the patient have difficulty walking or climbing stairs?: No Weakness of Legs: None Weakness of Arms/Hands: None  Permission Sought/Granted                  Emotional Assessment   Attitude/Demeanor/Rapport: Engaged Affect (typically observed): Pleasant Orientation: : Oriented to Self, Oriented to Place, Oriented to  Time, Oriented to Situation Alcohol / Substance Use: Illicit Drugs Psych Involvement: No (comment)  Admission diagnosis:  Endocarditis [I38] Bacterial endocarditis, unspecified chronicity [I33.0] Patient Active Problem List   Diagnosis Date Noted  . Endocarditis of mitral valve 08/05/2019  . Bacteremia due to Enterococcus 08/05/2019  . CAP (community acquired pneumonia) 11/15/2018  . Sepsis ([place]) 07/07/2018  . Noncompliance 07/06/2018  . Cocaine abuse ([place]) 07/06/2018  . Abscess   . Cellulitis and abscess  of upper extremity 07/05/2018  . IVDU (intravenous drug user)/Opiates/Cocaine 07/05/2018  . Polysubstance abuse /Cocaine and Opiates 07/05/2018  . Tobacco abuse 07/05/2018   PCP:  Patient, No Pcp Per Pharmacy:   [facility], [employer] - [place], [place] - [address] [place] [address] Phone: [phone] Fax: [phone]  [facility] -  [place], [place] - [address] [place] [address] Phone: [phone] Fax: [phone]     Social Determinants of Health (SDOH) Interventions    Readmission Risk Interventions Readmission Risk Prevention Plan 08/07/2019  Medication Screening Complete  Transportation Screening Complete  Some recent data might be hidden

## 2019-08-08 LAB — GENTAMICIN LEVEL, PEAK: Gentamicin Pk: 3.9 ug/mL — ABNORMAL LOW (ref 5.0–10.0)

## 2019-08-08 MED ORDER — LINEZOLID 600 MG PO TABS: 600.0000 mg | ORAL_TABLET | Freq: Two times a day (BID) | ORAL | 0 refills | Status: AC

## 2019-08-08 MED ORDER — IBUPROFEN 800 MG PO TABS: 800.0000 mg | ORAL_TABLET | Freq: Three times a day (TID) | ORAL | 0 refills | Status: DC

## 2019-08-08 MED FILL — IBUPROFEN 800 MG TAB: 800 | 10 days supply | Qty: 30 | Fill #0

## 2019-08-08 MED FILL — LINEZOLID 600 MG TABS: 600 | 14 days supply | Qty: 28 | Fill #0

## 2019-08-08 NOTE — Progress Notes (Signed)
Patient discharged home he declined be taken out in a wheelchair. AVS summary given to patient. I removed his PIV with catheter intact and whole. Patient walked out on his own.

## 2019-08-08 NOTE — Progress Notes (Signed)
Messaged Dr.Campbell of patient request be discharged home on po antibiotics,Dr.Mathews reported did not tell him could go home today would have to check with ID MD first. Dr.Mathews stated if patient would not listen he could go AMA.

## 2019-08-08 NOTE — Progress Notes (Signed)
Patient refused vs this am.

## 2019-08-08 NOTE — Progress Notes (Signed)
Patient ID: Timothy Christensen, male   DOB: [birth year], 24 y.o.   MRN: [id number]         [facility]  Date of Admission:  08/05/2019   Total days of antibiotics 28           ASSESSMENT: Timothy Christensen is a 24 y.o. 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 TEE at our facility "could not rule out vegetation" but he had ongoing mitral regurgitation described to be mild. Seems asymptomatic from a heart failure perspective.   Guidelines for native valve endocarditis due to enterococcus report 4 weeks of dual ampicillin-gentamicin therapy should be adequate for treatment, however could consider extending through 6 weeks is stated if chronic (>48months) in nature.  We discussed continuing with dual IV therapy inpatient vs transitioning to Linezolid BID x 2 weeks.  He would like to continue with supervised IV therapy.    PLAN: 1. Continue ampicillin + gentamicin for another 2 weeks then ready for DC home  2. Twice weekly BMP 3. Gent level per pharmacy 4. Weekly CBC    ADDENDUM: 1:17 PM  Notified that Timothy Christensen would like to finish out treatment with oral Linezolid at home x 2 weeks. Will notify [facility] pharmacy to dispense medication prior to D/C  Dr. Jerolyn Center aware of plan Will arrange clinic visit in 2 weeks to ensure he is doing well. May consider repeating his surface echo in 3 months following successful treatment to follow MR for any changes.  Unclear if he has hepatitis C screening / infection - will address outpatient at visit.   Rexene Alberts, NP     Principal Problem:   Endocarditis of mitral valve Active Problems:   IVDU (intravenous drug user)/Opiates/Cocaine   Polysubstance abuse /Cocaine and Opiates   Tobacco abuse   Bacteremia due to Enterococcus   Scheduled Meds: . buprenorphine-naloxone  1 tablet Sublingual TID  . diazepam  25 mg Oral TID  . enoxaparin (LOVENOX) injection  40 mg Subcutaneous Q24H  .  gabapentin  400 mg Oral TID  . ibuprofen  800 mg Oral TID  . melatonin  6 mg Oral QHS  . topiramate  25 mg Oral BID   Continuous Infusions: . sodium chloride Stopped (08/07/19 1848)  . ampicillin (OMNIPEN) IV 2 g (08/08/19 0936)  . gentamicin 60 mg (08/08/19 1218)   PRN Meds:.sodium chloride, acetaminophen **OR** acetaminophen, albuterol, ondansetron **OR** ondansetron (ZOFRAN) IV   SUBJECTIVE: He states he is doing well. Concerned about where his food is.  He states that he has had his infection for around a year off and on now but has had just over 4 weeks of treatment for this.  Afebile.    Review of Systems: Review of Systems  Constitutional: Negative for chills, diaphoresis and fever.  Respiratory: Negative for cough and shortness of breath.   Cardiovascular: Negative for chest pain.  Gastrointestinal: Negative for abdominal pain, diarrhea, nausea and vomiting.    No Known Allergies  OBJECTIVE: Vitals:   08/07/19 0451 08/07/19 0615 08/07/19 2040 08/08/19 0605  BP: 116/85  107/67   Pulse: 62  74   Resp: 20  18   Temp: 98.1 F (36.7 C)  98.3 F (36.8 C)   TempSrc: Oral  Oral   SpO2: 99%  99%   Weight:  76.1 kg  77.9 kg  Height:       Body mass index is 23.29 kg/m.  Physical Exam Constitutional:  Appearance: He is well-developed. He is not ill-appearing.     Comments: He was sleeping soundly flat in bed at my arrival. Woke easily.   Cardiovascular:     Rate and Rhythm: Normal rate and regular rhythm.     Heart sounds: Murmur heard.      Comments: No change in 3/6 holosystolic murmur. Pulmonary:     Effort: Pulmonary effort is normal.     Breath sounds: Normal breath sounds.  Abdominal:     General: There is no abdominal bruit.     Palpations: Abdomen is soft.  Musculoskeletal:        General: No swelling or tenderness.     Right lower leg: No edema.     Left lower leg: No edema.  Skin:    Findings: No rash.  Neurological:     General: No focal  deficit present.     Lab Results Lab Results  Component Value Date   WBC 2.9 (L) 08/06/2019   HGB 13.0 08/06/2019   HCT 40.3 08/06/2019   MCV 83.8 08/06/2019   PLT 229 08/06/2019    Lab Results  Component Value Date   CREATININE 0.90 08/06/2019   BUN 16 08/06/2019   NA 139 08/06/2019   K 3.7 08/06/2019   CL 108 08/06/2019   CO2 19 (L) 08/06/2019    Lab Results  Component Value Date   ALT 19 07/07/2018   AST 16 07/07/2018   ALKPHOS 74 07/07/2018   BILITOT 0.3 07/07/2018     Microbiology: Recent Results (from the past 240 hour(s))  Blood culture (routine x 2)     Status: None (Preliminary result)   Collection Time: 08/05/19  9:48 AM   Specimen: BLOOD LEFT FOREARM  Result Value Ref Range Status   Specimen Description BLOOD LEFT FOREARM  Final   Special Requests   Final    BOTTLES DRAWN AEROBIC AND ANAEROBIC Blood Culture adequate volume   Culture   Final    NO GROWTH 3 DAYS Performed at [facility], [address], [place], [place] [address]    Report Status PENDING  Incomplete  SARS Coronavirus 2 by RT PCR (hospital order, performed in [facility] hospital lab) Nasopharyngeal Nasopharyngeal Swab     Status: None   Collection Time: 08/05/19  9:51 AM   Specimen: Nasopharyngeal Swab  Result Value Ref Range Status   SARS Coronavirus 2 NEGATIVE NEGATIVE Final    Comment: (NOTE) SARS-CoV-2 target nucleic acids are NOT DETECTED.  The SARS-CoV-2 RNA is generally detectable in upper and lower respiratory specimens during the acute phase of infection. The lowest concentration of SARS-CoV-2 viral copies this assay can detect is 250 copies / mL. A negative result does not preclude SARS-CoV-2 infection and should not be used as the sole basis for treatment or other patient management decisions.  A negative result may occur with improper specimen collection / handling, submission of specimen other than nasopharyngeal swab, presence of viral mutation(s) within  the areas targeted by this assay, and inadequate number of viral copies (<250 copies / mL). A negative result must be combined with clinical observations, patient history, and epidemiological information.  Fact Sheet for Patients:   StrictlyIdeas.no  Fact Sheet for Healthcare Providers: BankingDealers.co.za  This test is not yet approved or  cleared by the [place] FDA and has been authorized for detection and/or diagnosis of SARS-CoV-2 by FDA under an Emergency Use Authorization (EUA).  This EUA will remain in effect (meaning this  test can be used) for the duration of the COVID-19 declaration under Section 564(b)(1) of the Act, 21 U.S.C. section 360bbb-3(b)(1), unless the authorization is terminated or revoked sooner.  Performed at [facility], [address], [place], [place] [address]   Blood culture (routine x 2)     Status: None (Preliminary result)   Collection Time: 08/05/19 10:22 AM   Specimen: BLOOD RIGHT HAND  Result Value Ref Range Status   Specimen Description BLOOD RIGHT HAND  Final   Special Requests   Final    BOTTLES DRAWN AEROBIC AND ANAEROBIC Blood Culture adequate volume   Culture   Final    NO GROWTH 3 DAYS Performed at [facility], [address] [place], [place] [address]    Report Status PENDING  Incomplete    Rexene Alberts, MD [facility] [phone] pager   [phone] cell 08/08/2019, 1:10 PM

## 2019-08-08 NOTE — Progress Notes (Signed)
Pharmacy Antibiotic Note  Timothy Christensen is a 24 y.o. male admitted on 08/05/2019 with bacteremia and endocarditis.  Pharmacy has been consulted for ampicillin and gentamicin dosing for enterococcal MV endocarditis/bacteremia. Pt was admitted to [facility] and undergoing treatment but left 6/19. Current course of therapy was started on 5/27, making today D#28 of his current course of therapy.   Gentamicin peak last night was collected about 30 minutes early (right at the end of the infusion) and resulted as 3.9. Gent trough <0.5 and drawn about 1 hour early. True gent peak is likely slightly lower but trough is at goal. Renal function is stable with Scr 0.90.   Plan: Continue Ampicillin 2gm IV Q4H Continue Gentamicin 60 mg q 12 hours  Consider repeat gent levels in a week if renal function remains stable F/U treatment plan   Height: 6' (182.9 cm) Weight: 77.9 kg (171 lb 11.8 oz) IBW/kg (Calculated) : 77.6  Temp (24hrs), Avg:98.3 F (36.8 C), Min:98.3 F (36.8 C), Max:98.3 F (36.8 C)  Recent Labs  Lab 08/05/19 0233 08/05/19 1013 08/06/19 0551 08/07/19 2340 08/08/19 0915  WBC 4.1  --  2.9*  --   --   CREATININE 0.86  --  0.90  --   --   LATICACIDVEN  --  1.8  --   --   --   GENTTROUGH  --   --   --   --  <0.5*  GENTPEAK  --   --   --  3.9*  --     Estimated Creatinine Clearance: 136.5 mL/min (by C-G formula based on SCr of 0.9 mg/dL).    No Known Allergies  Antimicrobials this admission: Amp 5/27>>6/19; 6/21>> Natasha Bence 6/1>>6/19; 6/21>>  Dose adjustments this admission: 6/24 gent peak- 3.9 and gent trough < 0.5- Cont 60 q 12   Microbiology results: 6/21 BCx:  NGTD   Thank you for allowing pharmacy to be a part of this patient's care.  Sharin Mons, PharmD, BCPS, BCIDP Infectious Diseases Clinical Pharmacist Phone: [phone] 08/08/2019 12:11 PM

## 2019-08-08 NOTE — TOC Transition Note (Signed)
Transition of Care [facility]) - CM/SW Discharge Note   Patient Details  Name: Dayvon Dax MRN: [id number] Date of Birth: Jun 19, 1995  Transition [facility]) CM/SW Contact:  Leone Haven, RN Phone Number: 08/08/2019, 1:27 PM   Clinical Narrative:    Patient is for dc today, NCM assisted patient with Medications with Match Letter.  TOC filling meds , will bring to patient room.  Patient states he follows up with Infectious disease and he has an apt.   Final next level of care: Home/Self Care Barriers to Discharge: No Barriers Identified   Patient Goals and CMS Choice        Discharge Placement                       Discharge Plan and Services In-house Referral: Clinical Social Work                                   Social Determinants of Health (SDOH) Interventions     Readmission Risk Interventions Readmission Risk Prevention Plan 08/07/2019  Medication Screening Complete  Transportation Screening Complete  Some recent data might be hidden

## 2019-08-08 NOTE — Plan of Care (Signed)
  Problem: Education: Goal: Knowledge of General Education information will improve Description: Including pain rating scale, medication(s)/side effects and non-pharmacologic comfort measures 08/08/2019 1419 by Saunders Revel, RN Outcome: Adequate for Discharge 08/08/2019 1305 by Saunders Revel, RN Outcome: Progressing   Problem: Health Behavior/Discharge Planning: Goal: Ability to manage health-related needs will improve 08/08/2019 1419 by Saunders Revel, RN Outcome: Adequate for Discharge 08/08/2019 1305 by Saunders Revel, RN Outcome: Progressing   Problem: Clinical Measurements: Goal: Ability to maintain clinical measurements within normal limits will improve 08/08/2019 1419 by Saunders Revel, RN Outcome: Adequate for Discharge 08/08/2019 1305 by Saunders Revel, RN Outcome: Progressing Goal: Will remain free from infection 08/08/2019 1419 by Saunders Revel, RN Outcome: Adequate for Discharge 08/08/2019 1305 by Saunders Revel, RN Outcome: Progressing Goal: Diagnostic test results will improve 08/08/2019 1419 by Saunders Revel, RN Outcome: Adequate for Discharge 08/08/2019 1305 by Saunders Revel, RN Outcome: Progressing Goal: Respiratory complications will improve 08/08/2019 1419 by Saunders Revel, RN Outcome: Adequate for Discharge 08/08/2019 1305 by Saunders Revel, RN Outcome: Progressing Goal: Cardiovascular complication will be avoided 08/08/2019 1419 by Saunders Revel, RN Outcome: Adequate for Discharge 08/08/2019 1305 by Saunders Revel, RN Outcome: Progressing   Problem: Activity: Goal: Risk for activity intolerance will decrease 08/08/2019 1419 by Saunders Revel, RN Outcome: Adequate for Discharge 08/08/2019 1305 by Saunders Revel, RN Outcome: Progressing   Problem: Nutrition: Goal: Adequate nutrition will be maintained 08/08/2019 1419 by Saunders Revel, RN Outcome: Adequate for Discharge 08/08/2019 1305 by  Saunders Revel, RN Outcome: Progressing   Problem: Coping: Goal: Level of anxiety will decrease 08/08/2019 1419 by Saunders Revel, RN Outcome: Adequate for Discharge 08/08/2019 1305 by Saunders Revel, RN Outcome: Progressing   Problem: Elimination: Goal: Will not experience complications related to bowel motility 08/08/2019 1419 by Saunders Revel, RN Outcome: Adequate for Discharge 08/08/2019 1305 by Saunders Revel, RN Outcome: Progressing Goal: Will not experience complications related to urinary retention 08/08/2019 1419 by Saunders Revel, RN Outcome: Adequate for Discharge 08/08/2019 1305 by Saunders Revel, RN Outcome: Progressing   Problem: Pain Managment: Goal: General experience of comfort will improve 08/08/2019 1419 by Saunders Revel, RN Outcome: Adequate for Discharge 08/08/2019 1305 by Saunders Revel, RN Outcome: Progressing   Problem: Safety: Goal: Ability to remain free from injury will improve 08/08/2019 1419 by Saunders Revel, RN Outcome: Adequate for Discharge 08/08/2019 1305 by Saunders Revel, RN Outcome: Progressing   Problem: Skin Integrity: Goal: Risk for impaired skin integrity will decrease 08/08/2019 1419 by Saunders Revel, RN Outcome: Adequate for Discharge 08/08/2019 1305 by Saunders Revel, RN Outcome: Progressing

## 2019-08-08 NOTE — Plan of Care (Signed)

## 2019-08-08 NOTE — Progress Notes (Signed)
Pt. Requesting Seroquel. Pt. Stated "It should be on my list". Pt. States he took med during previous hospital admission at [facility]. On call for [facility] paged to make aware.

## 2019-08-08 NOTE — Plan of Care (Signed)
  Problem: Activity: Goal: Risk for activity intolerance will decrease Outcome: Progressing   

## 2019-08-08 NOTE — Discharge Summary (Signed)
Physician Discharge Summary  Virginia Francisco WER:[id number] DOB: 1995-05-10 DOA: 08/05/2019  PCP: Patient, No Pcp Per  Admit date: 08/05/2019 Discharge date: 08/08/2019  Admitted From: Home Disposition: Home  recommendations for Outpatient Follow-up:  1. Follow up with PCP in 1-2 weeks 2. Please obtain BMP/CBC in one week  Home Health: None Equipment/Devices: None  Discharge Condition stable CODE STATUS full code  diet recommendation: Regular Brief/Interim Summary:Timothy Isleyis a 24 y.o.malewith medical history significant ofpolysubstance abuse(IV drug use, cocaine, opiate, tobacco)who presents to the hospital with complaints of chest pain. He reports that he just left [facility] hospital 2 days ago after being there for the last 4 weeks receiving IV antibiotics for Enterococcus faecalis bacteremia andmitral valve endocarditis with severe mitral regurgitation on echocardiogram. Patient had 2 more weeks left of IV antibiotics per discharge paperwork and thereafter was ultimately recommended to need surgical correction. States that he had to leave the hospital due to multiple reasons including someone getting involved in a car accident and being closer to his home. He complains of having continued severe left-sided chest pain that he describes as sharp and pressure-like in nature. Associated symptoms of shortness of breath and generalized malaise. After leaving the hospital patient did admit to snorting cocaine. Denies having any fevers, leg swelling, loss of consciousness, nausea, vomiting, or dysuria.  ED Course:Upon admission to the emergency department patient was noted to have stable vital signs. Labs are unremarkable including 2 - troponins. Chest x-ray showed no acute abnormalities. Blood cultures were obtained. Previous antibiotics given for confirmed by pharmacy and restarted onampicillin and gentamicin. TRH called to admit. 08/07/2019 records from outside hospital reviewed.  During  his last hospital stay there the scheduled meds he has been receiving of the following. Tylenol 1000 mg 3 times daily Suboxone sublingual 3 times daily Valium 25 mg 3 times daily Gabapentin 400 mg 3 times daily Ibuprofen 800 mg 3 times daily Melatonin 6 mg nightly Seroquel 25 mg 4 times a day which the patient does not want it Topiramate 25 mg twice daily Ampicillin 2 g IV every 4  Gentamicin 60 mg IV every 12 He reports his prescriptions are given to him by Dr. Robin [place]. He also reported he was getting oxycodone during the hospital stay along with the above medications   Discharge Diagnoses:  Principal Problem:   Endocarditis of mitral valve Active Problems:   IVDU (intravenous drug user)/Opiates/Cocaine   Polysubstance abuse /Cocaine and Opiates   Tobacco abuse   Bacteremia due to Enterococcus   #1 mitral valve endocarditis-reviewed records from [facility].  Patient was treated with amp and gent during the hospital stay.  He stayed for 3 days in the hospital.  Then he wanted to leave the hospital so ID was kind enough to get Zyvox and give it to him so he can go home and take the antibiotics.  #2 polysubstance abuse actively using cocaine.  Urine drug screen positive for cocaine opiates and benzos. He stated that he only snorts cocaine he does not use any IV drugs.  And he was planning to use cocaine as soon as he gets out of the hospital.   Estimated body mass index is 23.29 kg/m as calculated from the following:   Height as of this encounter: 6' (1.829 m).   Weight as of this encounter: 77.9 kg.  Discharge Instructions  Discharge Instructions    Diet - low sodium heart healthy   Complete by: As directed    Increase activity slowly  Complete by: As directed      Allergies as of 08/08/2019   No Known Allergies     Medication List    TAKE these medications   ibuprofen 800 MG tablet Commonly known as: ADVIL Take 1 tablet (800 mg total) by mouth 3  (three) times daily.   linezolid 600 MG tablet Commonly known as: ZYVOX Take 1 tablet (600 mg total) by mouth 2 (two) times daily for 14 days.       Follow-up Information    [facility] for Infectious Disease Follow up on 08/23/2019.   Specialty: Infectious Diseases Why: Appointment with Rexene Alberts, NP @ 11:15 am. Please arrive 15 minutes early to register.  Contact information: [address] [place], [address] [phone] mc [place] [address] [phone]             No Known Allergies  Consultations: Infectious disease  Procedures/Studies: DG Chest 2 View  Result Date: 08/05/2019 CLINICAL DATA:  Chest pain, history of endocarditis EXAM: CHEST - 2 VIEW COMPARISON:  11/15/2018 FINDINGS: Cardiomediastinal contours and hilar structures are normal. Lungs are clear.  No effusion. Visualized skeletal structures on limited assessment are unremarkable. IMPRESSION: No acute cardiopulmonary disease. Electronically Signed   By: Donzetta Kohut M.D.   On: 08/05/2019 10:30   ECHOCARDIOGRAM COMPLETE  Result Date: 08/06/2019    ECHOCARDIOGRAM REPORT   Patient Name:   Timothy Christensen Date of Exam: 08/06/2019 Medical Rec #:  [id number]   Height:       72.0 in Accession #:    [id number]  Weight:       168.7 lb Date of Birth:  Feb 08, 1996    BSA:          1.982 m Patient Age:    24 years    BP:           109/66 mmHg Patient Gender: M           HR:           58 bpm. Exam Location:  Inpatient Procedure: 2D Echo, 3D Echo, Cardiac Doppler and Color Doppler Indications:    Endocarditis I38  History:        Patient has no prior history of Echocardiogram examinations.  Sonographer:    Tiffany Dance Referring Phys: [id number] RONDELL A SMITH IMPRESSIONS  1. Left ventricular ejection fraction, by estimation, is 55 to 60%. The left ventricle has normal function. The left ventricle has no regional wall motion abnormalities. Left ventricular diastolic parameters were normal.  2. Right  ventricular systolic function is normal. The right ventricular size is normal.  3. Left atrial size was moderately dilated.  4. Cannot rule out vegetatoin on mitral valve on 3D images.     . The mitral valve is normal in structure. Moderate mitral valve regurgitation. No evidence of mitral stenosis.  5. The aortic valve is normal in structure. Aortic valve regurgitation is not visualized. No aortic stenosis is present.  6. There is mild dilatation at the level of the sinuses of Valsalva measuring 39 mm.  7. The inferior vena cava is normal in size with greater than 50% respiratory variability, suggesting right atrial pressure of 3 mmHg. Conclusion(s)/Recommendation(s): Cannot rule out mitral valvular vegetations on this transthoracic echocardiogram. Would recommend a transesophageal echocardiogram to exclude infective endocarditis if clinically indicated. FINDINGS  Left Ventricle: Left ventricular ejection fraction, by estimation, is 55 to 60%. The left ventricle has normal function. The left ventricle has no regional wall motion abnormalities.  The left ventricular internal cavity size was normal in size. There is  no left ventricular hypertrophy. Left ventricular diastolic parameters were normal. Normal left ventricular filling pressure. Right Ventricle: The right ventricular size is normal. No increase in right ventricular wall thickness. Right ventricular systolic function is normal. Left Atrium: Left atrial size was moderately dilated. Right Atrium: Right atrial size was normal in size. Pericardium: There is no evidence of pericardial effusion. Mitral Valve: Cannot rule out vegetatoin on mitral valve on 3D images. The mitral valve is normal in structure. Normal mobility of the mitral valve leaflets. Moderate mitral valve regurgitation. No evidence of mitral valve stenosis. Tricuspid Valve: The tricuspid valve is normal in structure. Tricuspid valve regurgitation is trivial. No evidence of tricuspid stenosis.  Aortic Valve: The aortic valve is normal in structure. Aortic valve regurgitation is not visualized. No aortic stenosis is present. Pulmonic Valve: The pulmonic valve was normal in structure. Pulmonic valve regurgitation is not visualized. No evidence of pulmonic stenosis. Aorta: The aortic root is normal in size and structure. There is mild dilatation at the level of the sinuses of Valsalva measuring 39 mm. Venous: The inferior vena cava is normal in size with greater than 50% respiratory variability, suggesting right atrial pressure of 3 mmHg. IAS/Shunts: No atrial level shunt detected by color flow Doppler.  LEFT VENTRICLE PLAX 2D LVIDd:         5.40 cm  Diastology LVIDs:         4.40 cm  LV e' lateral:   14.90 cm/s LV PW:         1.10 cm  LV E/e' lateral: 7.0 LV IVS:        1.00 cm  LV e' medial:    9.03 cm/s LVOT diam:     2.50 cm  LV E/e' medial:  11.5 LV SV:         81 LV SV Index:   41 LVOT Area:     4.91 cm  RIGHT VENTRICLE RV Basal diam:  3.50 cm RV Mid diam:    2.00 cm RV S prime:     11.00 cm/s TAPSE (M-mode): 2.1 cm LEFT ATRIUM             Index       RIGHT ATRIUM           Index LA diam:        4.30 cm 2.17 cm/m  RA Area:     17.60 cm LA Vol (A2C):   79.2 ml 39.97 ml/m RA Volume:   46.60 ml  23.52 ml/m LA Vol (A4C):   86.1 ml 43.45 ml/m LA Biplane Vol: 86.8 ml 43.80 ml/m  AORTIC VALVE LVOT Vmax:   79.10 cm/s LVOT Vmean:  48.900 cm/s LVOT VTI:    0.166 m  AORTA Ao Root diam: 3.90 cm Ao Asc diam:  3.10 cm MITRAL VALVE MV Area (PHT): 2.42 cm     SHUNTS MV Decel Time: 313 msec     Systemic VTI:  0.17 m MV E velocity: 104.00 cm/s  Systemic Diam: 2.50 cm MV A velocity: 39.80 cm/s MV E/A ratio:  2.61 Armanda Magic MD Electronically signed by Armanda Magic MD Signature Date/Time: 08/06/2019/3:17:54 PM    Final     (Echo, Carotid, EGD, Colonoscopy, ERCP)    Subjective:  Anxious to leave no other complaints Discharge Exam: Vitals:   08/07/19 0451 08/07/19 2040  BP: 116/85 107/67  Pulse: 62 74   Resp: 20 18  Temp: 98.1 F (36.7 C) 98.3 F (36.8 C)  SpO2: 99% 99%   Vitals:   08/07/19 0451 08/07/19 0615 08/07/19 2040 08/08/19 0605  BP: 116/85  107/67   Pulse: 62  74   Resp: 20  18   Temp: 98.1 F (36.7 C)  98.3 F (36.8 C)   TempSrc: Oral  Oral   SpO2: 99%  99%   Weight:  76.1 kg  77.9 kg  Height:        General: Pt is alert, awake, not in acute distress Cardiovascular: RRR, S1/S2 +, no rubs, no gallops systolic murmur Respiratory: CTA bilaterally, no wheezing, no rhonchi Abdominal: Soft, NT, ND, bowel sounds + Extremities: no edema, no cyanosis    The results of significant diagnostics from this hospitalization (including imaging, microbiology, ancillary and laboratory) are listed below for reference.     Microbiology: Recent Results (from the past 240 hour(s))  Blood culture (routine x 2)     Status: None (Preliminary result)   Collection Time: 08/05/19  9:48 AM   Specimen: BLOOD LEFT FOREARM  Result Value Ref Range Status   Specimen Description BLOOD LEFT FOREARM  Final   Special Requests   Final    BOTTLES DRAWN AEROBIC AND ANAEROBIC Blood Culture adequate volume   Culture   Final    NO GROWTH 3 DAYS Performed at [facility], [address] [place], [place] [address]    Report Status PENDING  Incomplete  SARS Coronavirus 2 by RT PCR (hospital order, performed in [facility] hospital lab) Nasopharyngeal Nasopharyngeal Swab     Status: None   Collection Time: 08/05/19  9:51 AM   Specimen: Nasopharyngeal Swab  Result Value Ref Range Status   SARS Coronavirus 2 NEGATIVE NEGATIVE Final    Comment: (NOTE) SARS-CoV-2 target nucleic acids are NOT DETECTED.  The SARS-CoV-2 RNA is generally detectable in upper and lower respiratory specimens during the acute phase of infection. The lowest concentration of SARS-CoV-2 viral copies this assay can detect is 250 copies / mL. A negative result does not preclude SARS-CoV-2 infection and should not be used  as the sole basis for treatment or other patient management decisions.  A negative result may occur with improper specimen collection / handling, submission of specimen other than nasopharyngeal swab, presence of viral mutation(s) within the areas targeted by this assay, and inadequate number of viral copies (<250 copies / mL). A negative result must be combined with clinical observations, patient history, and epidemiological information.  Fact Sheet for Patients:   StrictlyIdeas.no  Fact Sheet for Healthcare Providers: BankingDealers.co.za  This test is not yet approved or  cleared by the [place] FDA and has been authorized for detection and/or diagnosis of SARS-CoV-2 by FDA under an Emergency Use Authorization (EUA).  This EUA will remain in effect (meaning this test can be used) for the duration of the COVID-19 declaration under Section 564(b)(1) of the Act, 21 U.S.C. section 360bbb-3(b)(1), unless the authorization is terminated or revoked sooner.  Performed at [facility], [place] [address], [place], [place] [address]   Blood culture (routine x 2)     Status: None (Preliminary result)   Collection Time: 08/05/19 10:22 AM   Specimen: BLOOD RIGHT HAND  Result Value Ref Range Status   Specimen Description BLOOD RIGHT HAND  Final   Special Requests   Final    BOTTLES DRAWN AEROBIC AND ANAEROBIC Blood Culture adequate volume   Culture   Final  NO GROWTH 3 DAYS Performed at [facility], [address] [place], [place] [address]    Report Status PENDING  Incomplete     Labs: BNP (last 3 results) No results for input(s): BNP in the last 8760 hours. Basic Metabolic Panel: Recent Labs  Lab 08/05/19 0233 08/06/19 0551  NA 139 139  K 4.2 3.7  CL 107 108  CO2 20* 19*  GLUCOSE 113* 91  BUN 18 16  CREATININE 0.86 0.90  CALCIUM 9.2 9.1   Liver Function Tests: No results for input(s): AST, ALT, ALKPHOS,  BILITOT, PROT, ALBUMIN in the last 168 hours. No results for input(s): LIPASE, AMYLASE in the last 168 hours. No results for input(s): AMMONIA in the last 168 hours. CBC: Recent Labs  Lab 08/05/19 0233 08/06/19 0551  WBC 4.1 2.9*  HGB 13.5 13.0  HCT 42.5 40.3  MCV 85.7 83.8  PLT 255 229   Cardiac Enzymes: No results for input(s): CKTOTAL, CKMB, CKMBINDEX, TROPONINI in the last 168 hours. BNP: Invalid input(s): POCBNP CBG: No results for input(s): GLUCAP in the last 168 hours. D-Dimer No results for input(s): DDIMER in the last 72 hours. Hgb A1c No results for input(s): HGBA1C in the last 72 hours. Lipid Profile No results for input(s): CHOL, HDL, LDLCALC, TRIG, CHOLHDL, LDLDIRECT in the last 72 hours. Thyroid function studies No results for input(s): TSH, T4TOTAL, T3FREE, THYROIDAB in the last 72 hours.  Invalid input(s): FREET3 Anemia work up No results for input(s): VITAMINB12, FOLATE, FERRITIN, TIBC, IRON, RETICCTPCT in the last 72 hours. Urinalysis    Component Value Date/Time   COLORURINE YELLOW 07/05/2018 2024   APPEARANCEUR CLEAR 07/05/2018 2024   LABSPEC 1.013 07/05/2018 2024   PHURINE 6.0 07/05/2018 2024   GLUCOSEU NEGATIVE 07/05/2018 2024   HGBUR NEGATIVE 07/05/2018 2024   BILIRUBINUR NEGATIVE 07/05/2018 2024   KETONESUR NEGATIVE 07/05/2018 2024   PROTEINUR NEGATIVE 07/05/2018 2024   NITRITE NEGATIVE 07/05/2018 2024   LEUKOCYTESUR NEGATIVE 07/05/2018 2024   Sepsis Labs Invalid input(s): PROCALCITONIN,  WBC,  LACTICIDVEN Microbiology Recent Results (from the past 240 hour(s))  Blood culture (routine x 2)     Status: None (Preliminary result)   Collection Time: 08/05/19  9:48 AM   Specimen: BLOOD LEFT FOREARM  Result Value Ref Range Status   Specimen Description BLOOD LEFT FOREARM  Final   Special Requests   Final    BOTTLES DRAWN AEROBIC AND ANAEROBIC Blood Culture adequate volume   Culture   Final    NO GROWTH 3 DAYS Performed at [facility], [address] [place], [place] [address]    Report Status PENDING  Incomplete  SARS Coronavirus 2 by RT PCR (hospital order, performed in [facility] hospital lab) Nasopharyngeal Nasopharyngeal Swab     Status: None   Collection Time: 08/05/19  9:51 AM   Specimen: Nasopharyngeal Swab  Result Value Ref Range Status   SARS Coronavirus 2 NEGATIVE NEGATIVE Final    Comment: (NOTE) SARS-CoV-2 target nucleic acids are NOT DETECTED.  The SARS-CoV-2 RNA is generally detectable in upper and lower respiratory specimens during the acute phase of infection. The lowest concentration of SARS-CoV-2 viral copies this assay can detect is 250 copies / mL. A negative result does not preclude SARS-CoV-2 infection and should not be used as the sole basis for treatment or other patient management decisions.  A negative result may occur with improper specimen collection / handling, submission of specimen other than nasopharyngeal swab, presence of  viral mutation(s) within the areas targeted by this assay, and inadequate number of viral copies (<250 copies / mL). A negative result must be combined with clinical observations, patient history, and epidemiological information.  Fact Sheet for Patients:   BoilerBrush.com.cyhttps://www.fda.gov/media/136312/download  Fact Sheet for Healthcare Providers: https://pope.com/https://www.fda.gov/media/136313/download  This test is not yet approved or  cleared by the [place] FDA and has been authorized for detection and/or diagnosis of SARS-CoV-2 by FDA under an Emergency Use Authorization (EUA).  This EUA will remain in effect (meaning this test can be used) for the duration of the COVID-19 declaration under Section 564(b)(1) of the Act, 21 U.S.C. section 360bbb-3(b)(1), unless the authorization is terminated or revoked sooner.  Performed at [facility], [address] [place], [place] [address]   Blood culture (routine x 2)     Status: None (Preliminary result)    Collection Time: 08/05/19 10:22 AM   Specimen: BLOOD RIGHT HAND  Result Value Ref Range Status   Specimen Description BLOOD RIGHT HAND  Final   Special Requests   Final    BOTTLES DRAWN AEROBIC AND ANAEROBIC Blood Culture adequate volume   Culture   Final    NO GROWTH 3 DAYS Performed at [facility], [address] [place], [place] [address]    Report Status PENDING  Incomplete     Time coordinating discharge:  39 minutes  SIGNED:   Alwyn RenElizabeth G Issis Lindseth, MD  [facility] 08/08/2019, 1:40 PM   If 7PM-7AM, please contact night-coverage [email or web address] Password [email or web address]

## 2019-08-08 NOTE — Progress Notes (Signed)
Patient asking to go home now wants me tell MD can send his RX to [facility] and that his antibiotics can be converted to PO. Writer messaged MD to alert her of his request I told patient it was a process that took time,he stated did not have a lot of time. He has dressed himself told me would not wait long. Not sure patient is not trying to leave AMA.

## 2019-08-09 LAB — GENTAMICIN LEVEL, TROUGH: Gentamicin Trough: <0.5 ug/mL — ABNORMAL LOW (ref 0.5–2.0)

## 2019-08-10 LAB — CULTURE, BLOOD (ROUTINE X 2)
Culture: NO GROWTH
Culture: NO GROWTH
Special Requests: ADEQUATE
Special Requests: ADEQUATE

## 2019-08-23 ENCOUNTER — Inpatient Hospital Stay: Payer: Self-pay | Admitting: Infectious Diseases

## 2019-10-24 ENCOUNTER — Other Ambulatory Visit: Payer: Self-pay

## 2019-10-24 ENCOUNTER — Inpatient Hospital Stay (HOSPITAL_COMMUNITY)
Admission: EM | Admit: 2019-10-24 | Discharge: 2019-10-29 | DRG: 289 | Disposition: A | Discharge status: Home / Self Care | Payer: Self-pay | Attending: Family Medicine | Admitting: Family Medicine

## 2019-10-24 ENCOUNTER — Encounter (HOSPITAL_COMMUNITY): Payer: Self-pay | Admitting: Emergency Medicine

## 2019-10-24 DIAGNOSIS — Z8679 Personal history of other diseases of the circulatory system: Secondary | ICD-10-CM

## 2019-10-24 DIAGNOSIS — R7881 Bacteremia: Secondary | ICD-10-CM | POA: Diagnosis present

## 2019-10-24 DIAGNOSIS — R0989 Other specified symptoms and signs involving the circulatory and respiratory systems: Secondary | ICD-10-CM

## 2019-10-24 DIAGNOSIS — I330 Acute and subacute infective endocarditis: Principal | ICD-10-CM | POA: Diagnosis present

## 2019-10-24 DIAGNOSIS — F1990 Other psychoactive substance use, unspecified, uncomplicated: Secondary | ICD-10-CM | POA: Diagnosis present

## 2019-10-24 DIAGNOSIS — L298 Other pruritus: Secondary | ICD-10-CM | POA: Diagnosis not present

## 2019-10-24 DIAGNOSIS — I059 Rheumatic mitral valve disease, unspecified: Secondary | ICD-10-CM | POA: Diagnosis present

## 2019-10-24 DIAGNOSIS — Z5329 Procedure and treatment not carried out because of patient's decision for other reasons: Secondary | ICD-10-CM | POA: Diagnosis not present

## 2019-10-24 DIAGNOSIS — T368X5A Adverse effect of other systemic antibiotics, initial encounter: Secondary | ICD-10-CM | POA: Diagnosis not present

## 2019-10-24 DIAGNOSIS — Y92230 Patient room in hospital as the place of occurrence of the external cause: Secondary | ICD-10-CM | POA: Diagnosis not present

## 2019-10-24 DIAGNOSIS — F17210 Nicotine dependence, cigarettes, uncomplicated: Secondary | ICD-10-CM | POA: Diagnosis present

## 2019-10-24 DIAGNOSIS — F1410 Cocaine abuse, uncomplicated: Secondary | ICD-10-CM | POA: Diagnosis present

## 2019-10-24 DIAGNOSIS — K219 Gastro-esophageal reflux disease without esophagitis: Secondary | ICD-10-CM | POA: Diagnosis present

## 2019-10-24 DIAGNOSIS — K30 Functional dyspepsia: Secondary | ICD-10-CM | POA: Diagnosis not present

## 2019-10-24 DIAGNOSIS — Z20822 Contact with and (suspected) exposure to covid-19: Secondary | ICD-10-CM | POA: Diagnosis present

## 2019-10-24 DIAGNOSIS — F1910 Other psychoactive substance abuse, uncomplicated: Secondary | ICD-10-CM | POA: Diagnosis present

## 2019-10-24 DIAGNOSIS — B9561 Methicillin susceptible Staphylococcus aureus infection as the cause of diseases classified elsewhere: Secondary | ICD-10-CM | POA: Diagnosis present

## 2019-10-24 DIAGNOSIS — I5022 Chronic systolic (congestive) heart failure: Secondary | ICD-10-CM | POA: Diagnosis present

## 2019-10-24 LAB — CBC
HCT: 41.9 % (ref 39.0–52.0)
Hemoglobin: 13.5 g/dL (ref 13.0–17.0)
MCH: 28.2 pg (ref 26.0–34.0)
MCHC: 32.2 g/dL (ref 30.0–36.0)
MCV: 87.5 fL (ref 80.0–100.0)
Platelets: 209 10*3/uL (ref 150–400)
RBC: 4.79 MIL/uL (ref 4.22–5.81)
RDW: 13.9 % (ref 11.5–15.5)
WBC: 15.5 10*3/uL — ABNORMAL HIGH (ref 4.0–10.5)
nRBC: 0 % (ref 0.0–0.2)

## 2019-10-24 LAB — BASIC METABOLIC PANEL
Anion gap: 13 (ref 5–15)
BUN: 10 mg/dL (ref 6–20)
CO2: 23 mmol/L (ref 22–32)
Calcium: 9 mg/dL (ref 8.9–10.3)
Chloride: 103 mmol/L (ref 98–111)
Creatinine, Ser: 1.32 mg/dL — ABNORMAL HIGH (ref 0.61–1.24)
GFR calc Af Amer: >60 mL/min (ref >60)
GFR calc non Af Amer: >60 mL/min (ref >60)
Glucose, Bld: 131 mg/dL — ABNORMAL HIGH (ref 70–99)
Potassium: 4 mmol/L (ref 3.5–5.1)
Sodium: 139 mmol/L (ref 135–145)

## 2019-10-24 NOTE — ED Triage Notes (Signed)
Pt has c/o of weakness and pain in both legs and pain. Pt also c/o of fever and neck pain.  Pt was admitted months ago for sepsis and took indicated medications as prescribed. Pt took ibuprofen at 1:00pm 800mg .

## 2019-10-25 ENCOUNTER — Emergency Department (HOSPITAL_COMMUNITY): Payer: Self-pay

## 2019-10-25 ENCOUNTER — Encounter (HOSPITAL_COMMUNITY): Payer: Self-pay | Admitting: Emergency Medicine

## 2019-10-25 ENCOUNTER — Inpatient Hospital Stay (HOSPITAL_COMMUNITY): Payer: Self-pay

## 2019-10-25 ENCOUNTER — Other Ambulatory Visit: Payer: Self-pay

## 2019-10-25 DIAGNOSIS — I340 Nonrheumatic mitral (valve) insufficiency: Secondary | ICD-10-CM

## 2019-10-25 DIAGNOSIS — F1910 Other psychoactive substance abuse, uncomplicated: Secondary | ICD-10-CM

## 2019-10-25 DIAGNOSIS — R0989 Other specified symptoms and signs involving the circulatory and respiratory systems: Secondary | ICD-10-CM

## 2019-10-25 DIAGNOSIS — I38 Endocarditis, valve unspecified: Secondary | ICD-10-CM

## 2019-10-25 LAB — ECHOCARDIOGRAM COMPLETE
AR max vel: 0.73 cm2
AV Area VTI: 0.86 cm2
AV Area mean vel: 0.82 cm2
AV Mean grad: 26.7 mmHg
AV Peak grad: 49.5 mmHg
Ao pk vel: 3.52 m/s
Area-P 1/2: 4.06 cm2
Height: 72 in
S' Lateral: 4.1 cm
Weight: 2747.81 oz

## 2019-10-25 LAB — BLOOD CULTURE ID PANEL (REFLEXED) - BCID2

## 2019-10-25 LAB — CBC WITH DIFFERENTIAL/PLATELET
Abs Immature Granulocytes: 0.01 10*3/uL (ref 0.00–0.07)
Basophils Absolute: 0 10*3/uL (ref 0.0–0.1)
Basophils Relative: 0 %
Eosinophils Absolute: 0.1 10*3/uL (ref 0.0–0.5)
Eosinophils Relative: 1 %
HCT: 42 % (ref 39.0–52.0)
Hemoglobin: 13.3 g/dL (ref 13.0–17.0)
Immature Granulocytes: 0 %
Lymphocytes Relative: 5 %
Lymphs Abs: 0.5 10*3/uL — ABNORMAL LOW (ref 0.7–4.0)
MCH: 28 pg (ref 26.0–34.0)
MCHC: 31.7 g/dL (ref 30.0–36.0)
MCV: 88.4 fL (ref 80.0–100.0)
Monocytes Absolute: 0.4 10*3/uL (ref 0.1–1.0)
Monocytes Relative: 5 %
Neutro Abs: 8.4 10*3/uL — ABNORMAL HIGH (ref 1.7–7.7)
Neutrophils Relative %: 89 %
Platelets: 220 10*3/uL (ref 150–400)
RBC: 4.75 MIL/uL (ref 4.22–5.81)
RDW: 14.1 % (ref 11.5–15.5)
WBC: 9.4 10*3/uL (ref 4.0–10.5)
nRBC: 0 % (ref 0.0–0.2)

## 2019-10-25 LAB — RAPID URINE DRUG SCREEN, HOSP PERFORMED
Amphetamines: NOT DETECTED
Barbiturates: NOT DETECTED
Benzodiazepines: POSITIVE — AB
Cocaine: POSITIVE — AB
Opiates: NOT DETECTED
Tetrahydrocannabinol: NOT DETECTED

## 2019-10-25 LAB — COMPREHENSIVE METABOLIC PANEL
ALT: 48 U/L — ABNORMAL HIGH (ref 0–44)
AST: 48 U/L — ABNORMAL HIGH (ref 15–41)
Albumin: 3.4 g/dL — ABNORMAL LOW (ref 3.5–5.0)
Alkaline Phosphatase: 63 U/L (ref 38–126)
Anion gap: 7 (ref 5–15)
BUN: 11 mg/dL (ref 6–20)
CO2: 25 mmol/L (ref 22–32)
Calcium: 8.5 mg/dL — ABNORMAL LOW (ref 8.9–10.3)
Chloride: 107 mmol/L (ref 98–111)
Creatinine, Ser: 0.77 mg/dL (ref 0.61–1.24)
GFR calc Af Amer: >60 mL/min (ref >60)
GFR calc non Af Amer: >60 mL/min (ref >60)
Glucose, Bld: 90 mg/dL (ref 70–99)
Potassium: 4 mmol/L (ref 3.5–5.1)
Sodium: 139 mmol/L (ref 135–145)
Total Bilirubin: 0.6 mg/dL (ref 0.3–1.2)
Total Protein: 5.7 g/dL — ABNORMAL LOW (ref 6.5–8.1)

## 2019-10-25 LAB — PROCALCITONIN: Procalcitonin: 4.9 ng/mL

## 2019-10-25 LAB — URINALYSIS, ROUTINE W REFLEX MICROSCOPIC
Bilirubin Urine: NEGATIVE
Glucose, UA: NEGATIVE mg/dL
Hgb urine dipstick: NEGATIVE
Ketones, ur: NEGATIVE mg/dL
Leukocytes,Ua: NEGATIVE
Nitrite: NEGATIVE
Protein, ur: NEGATIVE mg/dL
Specific Gravity, Urine: 1.012 (ref 1.005–1.030)
pH: 7 (ref 5.0–8.0)

## 2019-10-25 LAB — TROPONIN I (HIGH SENSITIVITY)
Troponin I (High Sensitivity): 9 ng/L (ref <18)
Troponin I (High Sensitivity): 8 ng/L (ref <18)

## 2019-10-25 LAB — LACTIC ACID, PLASMA
Lactic Acid, Venous: 1.5 mmol/L (ref 0.5–1.9)
Lactic Acid, Venous: 0.8 mmol/L (ref 0.5–1.9)

## 2019-10-25 LAB — SARS CORONAVIRUS 2 BY RT PCR (HOSPITAL ORDER, PERFORMED IN ~~LOC~~ HOSPITAL LAB): SARS Coronavirus 2: NEGATIVE

## 2019-10-25 LAB — CK: Total CK: 37 U/L — ABNORMAL LOW (ref 49–397)

## 2019-10-25 MED ORDER — ONDANSETRON HCL 4 MG/2ML IJ SOLN
4.0000 mg | Freq: Four times a day (QID) | INTRAMUSCULAR | Status: DC | PRN
  Administered 2019-10-25: 4 mg via INTRAVENOUS
  Filled 2019-10-25: qty 2

## 2019-10-25 MED ORDER — VANCOMYCIN HCL 2000 MG/400ML IV SOLN
2000.0000 mg | Freq: Once | INTRAVENOUS | Status: AC
  Administered 2019-10-25: 2000 mg via INTRAVENOUS
  Filled 2019-10-25: qty 400

## 2019-10-25 MED ORDER — HALOPERIDOL LACTATE 5 MG/ML IJ SOLN
2.0000 mg | Freq: Once | INTRAMUSCULAR | Status: AC
  Administered 2019-10-25: 2 mg via INTRAVENOUS
  Filled 2019-10-25: qty 1

## 2019-10-25 MED ORDER — VANCOMYCIN HCL 1500 MG/300ML IV SOLN: 1500.0000 mg | INTRAVENOUS | Status: DC

## 2019-10-25 MED ORDER — KETOROLAC TROMETHAMINE 30 MG/ML IJ SOLN
30.0000 mg | Freq: Once | INTRAMUSCULAR | Status: AC
  Administered 2019-10-25: 30 mg via INTRAVENOUS
  Filled 2019-10-25: qty 1

## 2019-10-25 MED ORDER — SODIUM CHLORIDE 0.9 % IV SOLN
250.0000 mL | INTRAVENOUS | Status: DC | PRN
  Administered 2019-10-26 – 2019-10-29 (×2): 250 mL via INTRAVENOUS

## 2019-10-25 MED ORDER — ACETAMINOPHEN 325 MG PO TABS
650.0000 mg | ORAL_TABLET | Freq: Four times a day (QID) | ORAL | Status: DC | PRN
  Administered 2019-10-25: 650 mg via ORAL
  Filled 2019-10-25: qty 2

## 2019-10-25 MED ORDER — DIAZEPAM 5 MG PO TABS
10.0000 mg | ORAL_TABLET | Freq: Three times a day (TID) | ORAL | Status: DC
  Administered 2019-10-25 – 2019-10-26 (×3): 10 mg via ORAL
  Filled 2019-10-25 (×3): qty 2

## 2019-10-25 MED ORDER — SODIUM CHLORIDE 0.9 % IV SOLN
2.0000 g | INTRAVENOUS | Status: AC
  Administered 2019-10-25: 2 g via INTRAVENOUS
  Filled 2019-10-25: qty 2

## 2019-10-25 MED ORDER — BUPRENORPHINE HCL-NALOXONE HCL 8-2 MG SL SUBL
1.0000 | SUBLINGUAL_TABLET | Freq: Two times a day (BID) | SUBLINGUAL | Status: DC
  Administered 2019-10-25 – 2019-10-28 (×7): 1 via SUBLINGUAL
  Filled 2019-10-25 (×7): qty 1

## 2019-10-25 MED ORDER — POLYETHYLENE GLYCOL 3350 17 G PO PACK: 17.0000 g | PACK | Freq: Every day | ORAL | Status: DC | PRN

## 2019-10-25 MED ORDER — SODIUM CHLORIDE 0.9 % IV SOLN: 2.0000 g | Freq: Once | INTRAVENOUS | Status: DC

## 2019-10-25 MED ORDER — GABAPENTIN 400 MG PO CAPS
400.0000 mg | ORAL_CAPSULE | Freq: Three times a day (TID) | ORAL | Status: DC
  Administered 2019-10-25 – 2019-10-29 (×13): 400 mg via ORAL
  Filled 2019-10-25 (×13): qty 1

## 2019-10-25 MED ORDER — LACTATED RINGERS IV SOLN: INTRAVENOUS | Status: AC

## 2019-10-25 MED ORDER — VANCOMYCIN HCL IN DEXTROSE 1-5 GM/200ML-% IV SOLN
1000.0000 mg | Freq: Three times a day (TID) | INTRAVENOUS | Status: DC
  Administered 2019-10-25: 1000 mg via INTRAVENOUS
  Filled 2019-10-25: qty 200

## 2019-10-25 MED ORDER — VANCOMYCIN HCL IN DEXTROSE 1-5 GM/200ML-% IV SOLN: 1000.0000 mg | Freq: Once | INTRAVENOUS | Status: DC

## 2019-10-25 MED ORDER — CEFAZOLIN SODIUM-DEXTROSE 2-4 GM/100ML-% IV SOLN
2.0000 g | Freq: Three times a day (TID) | INTRAVENOUS | Status: DC
  Administered 2019-10-25 – 2019-10-29 (×11): 2 g via INTRAVENOUS
  Filled 2019-10-25 (×11): qty 100

## 2019-10-25 MED ORDER — SODIUM CHLORIDE 0.9% FLUSH
3.0000 mL | Freq: Two times a day (BID) | INTRAVENOUS | Status: DC
  Administered 2019-10-25 – 2019-10-29 (×7): 3 mL via INTRAVENOUS

## 2019-10-25 MED ORDER — SODIUM CHLORIDE 0.9 % IV BOLUS
500.0000 mL | Freq: Once | INTRAVENOUS | Status: AC
  Administered 2019-10-25: 500 mL via INTRAVENOUS

## 2019-10-25 MED ORDER — SODIUM CHLORIDE 0.9% FLUSH
3.0000 mL | INTRAVENOUS | Status: DC | PRN
  Administered 2019-10-26: 3 mL via INTRAVENOUS

## 2019-10-25 NOTE — Significant Event (Signed)
HOSPITAL MEDICINE OVERNIGHT EVENT NOTE    Notified by nursing that patient is refusing his LR infusion.  Notified by pharmacy that patient has 2 out of 4 blood culture bottles positive for MSSA.  IV Cefazolin initiated per pharmacy protocol.    Marinda Elk  MD [facility]

## 2019-10-25 NOTE — Progress Notes (Signed)
Patient refuses LR infusion citing annoyance with IV tubing. MD notified.

## 2019-10-25 NOTE — Progress Notes (Signed)
Pharmacy Antibiotic Note  Timothy Christensen is a 24 y.o. 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 Pharmacy has been consulted for Vancomycin dosing.  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 It appears he did receive Amp/Gent there for presumably 4 weeks, then left AMA. He was re-admitted to [facility] in June and received 3 additional days of Amp/Gent before being discharged on a 2 week course of Zyvox.  The patient already received a dose of Vanc 2g IV x 1 at 0630 this AM  Plan: - Start Vancomycin 1g IV every 8 hours - Will continue to follow renal function, culture results, LOT, and antibiotic de-escalation plans   Height: 6' (182.9 cm) Weight: 77.9 kg (171 lb 11.8 oz) IBW/kg (Calculated) : 77.6  Temp (24hrs), Avg:98.2 F (36.8 C), Min:97.4 F (36.3 C), Max:98.9 F (37.2 C)  Recent Labs  Lab 10/24/19 1452 10/25/19 0427 10/25/19 0630  WBC 15.5*  --   --   CREATININE 1.32*  --  0.77  LATICACIDVEN  --  1.5 0.8    Estimated Creatinine Clearance: 153.6 mL/min (by C-G formula based on SCr of 0.77 mg/dL).    No Known Allergies  Antimicrobials this admission: Cefepime 9/10 x 1 Vanc 9/10 >>  Dose adjustments this admission: n/a  Microbiology results: 9/10 UCx >> 9/10 BCx >> 9/10 COVID >> neg  Thank you for allowing pharmacy to be a part of this patient's care.  Georgina Pillion, PharmD, BCPS Clinical Pharmacist Clinical phone for 10/25/2019: [phone] 10/25/2019 10:33 AM   **Pharmacist phone directory can now be found on [email or web address] ([email or web address]).  Listed under [facility].

## 2019-10-25 NOTE — Plan of Care (Signed)
  Problem: Education: Goal: Knowledge of General Education information will improve Description: Including pain rating scale, medication(s)/side effects and non-pharmacologic comfort measures Outcome: Progressing   Problem: Fluid Volume: Goal: Hemodynamic stability will improve Outcome: Progressing   Problem: Clinical Measurements: Goal: Diagnostic test results will improve Outcome: Progressing Goal: Signs and symptoms of infection will decrease Outcome: Progressing   Problem: Respiratory: Goal: Ability to maintain adequate ventilation will improve Outcome: Progressing   Problem: Education: Goal: Knowledge of General Education information will improve Description: Including pain rating scale, medication(s)/side effects and non-pharmacologic comfort measures Outcome: Progressing   Problem: Health Behavior/Discharge Planning: Goal: Ability to manage health-related needs will improve Outcome: Progressing   Problem: Clinical Measurements: Goal: Ability to maintain clinical measurements within normal limits will improve Outcome: Progressing Goal: Will remain free from infection Outcome: Progressing Goal: Diagnostic test results will improve Outcome: Progressing Goal: Respiratory complications will improve Outcome: Progressing Goal: Cardiovascular complication will be avoided Outcome: Progressing   Problem: Activity: Goal: Risk for activity intolerance will decrease Outcome: Progressing   Problem: Nutrition: Goal: Adequate nutrition will be maintained Outcome: Progressing   Problem: Coping: Goal: Level of anxiety will decrease Outcome: Progressing   Problem: Elimination: Goal: Will not experience complications related to bowel motility Outcome: Progressing Goal: Will not experience complications related to urinary retention Outcome: Progressing   Problem: Pain Managment: Goal: General experience of comfort will improve Outcome: Progressing   Problem:  Safety: Goal: Ability to remain free from injury will improve Outcome: Progressing   Problem: Skin Integrity: Goal: Risk for impaired skin integrity will decrease Outcome: Progressing

## 2019-10-25 NOTE — ED Notes (Signed)
Patient continues to take monitor and b/p cuff off.

## 2019-10-25 NOTE — ED Notes (Signed)
Patient ambulatory to bathroom. States he needs to have BM

## 2019-10-25 NOTE — ED Notes (Signed)
Lunch tray at bedside , attempted to wake patient several times to eat, states he would get up in a little while

## 2019-10-25 NOTE — ED Provider Notes (Signed)
[facility] EMERGENCY DEPARTMENT Provider Note   CSN: [id number] Arrival date & time: 10/24/19  1442     History Chief Complaint  Patient presents with  . Weakness    Tysheem Accardo is a 24 y.o. male.  The history is provided by the patient.  Chest Pain Pain location:  Substernal area Pain quality: dull   Pain radiates to:  Does not radiate Pain severity:  Moderate Onset quality:  Gradual Timing:  Constant Progression:  Unchanged Chronicity:  Recurrent Context: at rest   Relieved by:  Nothing Worsened by:  Nothing Ineffective treatments:  None tried Associated symptoms: fever   Associated symptoms: no cough, no heartburn, no nausea, no near-syncope, no numbness, no vomiting and no weakness   Risk factors: no aortic disease   Risk factors comment:  Endocarditis  Patient with a h/o IVDA and endocarditis presentswith fatigue and fevers and chest pain.  No cough, no loss of taste or smell.  He also has pain in the legs.  Complains of ongoing joint pain.  Is refusing to undress for exam.  No bowel or bladder issues.       Past Medical History:  Diagnosis Date  . Endocarditis     Patient Active Problem List   Diagnosis Date Noted  . Endocarditis of mitral valve 08/05/2019  . Bacteremia due to Enterococcus 08/05/2019  . CAP (community acquired pneumonia) 11/15/2018  . Sepsis (HCC) 07/07/2018  . Noncompliance 07/06/2018  . Cocaine abuse (HCC) 07/06/2018  . Abscess   . Cellulitis and abscess of upper extremity 07/05/2018  . IVDU (intravenous drug user)/Opiates/Cocaine 07/05/2018  . Polysubstance abuse /Cocaine and Opiates 07/05/2018  . Tobacco abuse 07/05/2018    Past Surgical History:  Procedure Laterality Date  . I & D EXTREMITY Left 07/08/2018   Procedure: IRRIGATION AND DEBRIDEMENT HUMERUS;  Surgeon: Bjorn Pippin, MD;  Location: [facility] OR;  Service: Orthopedics;  Laterality: Left;  . WRIST SURGERY         History reviewed. No pertinent family  history.  Social History   Tobacco Use  . Smoking status: Current Every Day Smoker    Packs/day: 0.50    Types: Cigarettes  . Smokeless tobacco: Never Used  Vaping Use  . Vaping Use: Never used  Substance Use Topics  . Alcohol use: No  . Drug use: Yes    Types: Marijuana, Cocaine    Home Medications Prior to Admission medications   Medication Sig Start Date End Date Taking? Authorizing Provider  ibuprofen (ADVIL) 800 MG tablet Take 1 tablet (800 mg total) by mouth 3 (three) times daily. 08/08/19   Alwyn Ren, MD    Allergies    Patient has no known allergies.  Review of Systems   Review of Systems  Constitutional: Positive for fever.  HENT: Negative for congestion.   Eyes: Negative for visual disturbance.  Respiratory: Negative for cough.   Cardiovascular: Positive for chest pain. Negative for near-syncope.  Gastrointestinal: Negative for heartburn, nausea and vomiting.  Genitourinary: Negative for difficulty urinating.  Musculoskeletal: Positive for arthralgias. Negative for gait problem and joint swelling.  Skin: Negative for wound.  Neurological: Negative for weakness and numbness.  Psychiatric/Behavioral: Negative for confusion.  All other systems reviewed and are negative.   Physical Exam Updated Vital Signs BP (!) 100/55 (BP Location: Left Arm)   Pulse 88   Temp 98.9 F (37.2 C) (Oral)   Resp 20   Ht 6' (1.829 m)   Wt 77.9 kg  SpO2 98%   BMI 23.29 kg/m   Physical Exam Vitals and nursing note reviewed.  Constitutional:      General: He is not in acute distress.    Appearance: Normal appearance.  HENT:     Head: Normocephalic and atraumatic.     Nose: Nose normal.     Mouth/Throat:     Mouth: Mucous membranes are moist.  Eyes:     Conjunctiva/sclera: Conjunctivae normal.     Pupils: Pupils are equal, round, and reactive to light.  Cardiovascular:     Rate and Rhythm: Normal rate and regular rhythm.     Pulses: Normal pulses.      Heart sounds: Murmur heard.      Comments: Harsh 4/6 SEM and 2/6 DM  Pulmonary:     Effort: Pulmonary effort is normal.     Breath sounds: Normal breath sounds.  Abdominal:     General: Abdomen is flat. Bowel sounds are normal.     Palpations: Abdomen is soft. There is no mass.     Tenderness: There is no guarding or rebound.  Musculoskeletal:        General: Normal range of motion.     Cervical back: Normal range of motion and neck supple. No rigidity or tenderness.  Lymphadenopathy:     Cervical: No cervical adenopathy.  Skin:    General: Skin is warm and dry.     Capillary Refill: Capillary refill takes less than 2 seconds.     Comments: No splinter hemorrhages no osler nodes no Janeway lesions   Neurological:     Mental Status: He is alert.     Sensory: No sensory deficit.     Motor: No weakness.     Deep Tendon Reflexes: Reflexes normal.  Psychiatric:        Behavior: Behavior is aggressive.     ED Results / Procedures / Treatments   Labs (all labs ordered are listed, but only abnormal results are displayed) Labs Reviewed  BASIC METABOLIC PANEL - Abnormal; Notable for the following components:      Result Value   Glucose, Bld 131 (*)    Creatinine, Ser 1.32 (*)    All other components within normal limits  CBC - Abnormal; Notable for the following components:   WBC 15.5 (*)    All other components within normal limits  CULTURE, BLOOD (ROUTINE X 2)  CULTURE, BLOOD (ROUTINE X 2)  URINE CULTURE  SARS CORONAVIRUS 2 BY RT PCR (HOSPITAL ORDER, PERFORMED IN [facility])  URINALYSIS, ROUTINE W REFLEX MICROSCOPIC  CK  LACTIC ACID, PLASMA  LACTIC ACID, PLASMA  URINALYSIS, ROUTINE W REFLEX MICROSCOPIC  RAPID URINE DRUG SCREEN, HOSP PERFORMED  CBG MONITORING, ED  TROPONIN I (HIGH SENSITIVITY)    EKG None  Radiology DG Chest 2 View  Result Date: 10/25/2019 CLINICAL DATA:  Endocarditis. Weakness. Pain in both legs. Fever and neck pain. EXAM: CHEST - 2  VIEW COMPARISON:  08/05/2019 FINDINGS: The heart size and mediastinal contours are within normal limits. Both lungs are clear. The visualized skeletal structures are unremarkable. IMPRESSION: No active cardiopulmonary disease. Electronically Signed   By: Burman Nieves M.D.   On: 10/25/2019 04:10    Procedures Procedures (including critical care time)  Medications Ordered in ED Medications  haloperidol lactate (HALDOL) injection 2 mg (has no administration in time range)  ceFEPIme (MAXIPIME) 2 g in sodium chloride 0.9 % 100 mL IVPB (has no administration in time range)  vancomycin (VANCOREADY)  IVPB 2000 mg/400 mL (has no administration in time range)  sodium chloride 0.9 % bolus 500 mL (has no administration in time range)  ketorolac (TORADOL) 30 MG/ML injection 30 mg (has no administration in time range)    ED Course  I have reviewed the triage vital signs and the nursing notes.  Pertinent labs & imaging results that were available during my care of the patient were reviewed by me and considered in my medical decision making (see chart for details).    Refusing to get undressed but refusing to leave.  Secureity to the room.  Patient then decided to comply but is refusing to comply with Xrays.  Is 5/5 in BLE with intact sensation.  Completely non tender spine.  I suspect much of this is volitional as patient is refusing to do more than open his eyes.   Deovion Batrez was evaluated in Emergency Department on 10/25/2019 for the symptoms described in the history of present illness. He was evaluated in the context of the global COVID-19 pandemic, which necessitated consideration that the patient might be at risk for infection with the SARS-CoV-2 virus that causes COVID-19. Institutional protocols and algorithms that pertain to the evaluation of patients at risk for COVID-19 are in a state of rapid change based on information released by regulatory bodies including the CDC and federal and state  organizations. These policies and algorithms were followed during the patient's care in the ED.  Final Clinical Impression(s) / ED Diagnoses Final diagnoses:  Bacterial endocarditis, unspecified chronicity   Admit to medicine    Skyla Champagne, MD 10/25/19 1914

## 2019-10-25 NOTE — H&P (Signed)
History and Physical:    Timothy Christensen   ONG:[id number] DOB: 27-Apr-1995 DOA: 10/24/2019  Referring MD/provider: Rodman Christensen PCP: Patient, No Pcp Per   Patient coming from: Home  Chief Complaint: "I am not feeling well".  History of Present Illness:   Timothy Christensen is an 24 y.o. 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  Patient states that he was doing well until 6 weeks ago when he started feeling "unwell".  Patient is not a very cooperative historian.  Patient states that he started having weakness, fevers and chills.  Notes "it did feel like my endocarditis from before".  2 weeks ago he started helping shortness of breath, denies cough or hemoptysis.  He does admit to "a sharp pain in my chest near my heart" but is unable to state any relieving or aggravating factors notes "I do not pay attention to that".  Patient specifically denies any pleuritic chest pain.  Last week he started having abdominal pain, is unable to state what part of the abdomen.  No diarrhea.  No productive cough.  Patient does admit to back pain and generalized weakness.  ED Course:  The patient patient was noted to be afebrile, hemodynamically stable with no oxygen requirement.  He was uncooperative with history or physical exam.  He was belligerent and security had to be called and patient was given Haldol.  Given his PMH, and procalcitonin of 4, 1 set of blood cultures were drawn and patient was given vancomycin and cefepime.  He was called in for admission   ROS:   ROS   Review of Systems: Patient is uncooperative with review of systems  Past Medical History:   Past Medical History:  Diagnosis Date  . Endocarditis     Past Surgical History:   Past Surgical History:  Procedure Laterality Date  . I & D EXTREMITY Left 07/08/2018    Procedure: IRRIGATION AND DEBRIDEMENT HUMERUS;  Surgeon: Bjorn Pippin, MD;  Location: [facility] OR;  Service: Orthopedics;  Laterality: Left;  . WRIST SURGERY      Social History:   Social History   Socioeconomic History  . Marital status: Single    Spouse name: Not on file  . Number of children: Not on file  . Years of education: Not on file  . Highest education level: Not on file  Occupational History  . Not on file  Tobacco Use  . Smoking status: Current Every Day Smoker    Packs/day: 0.50    Types: Cigarettes  . Smokeless tobacco: Never Used  Vaping Use  . Vaping Use: Never used  Substance and Sexual Activity  . Alcohol use: No  . Drug use: Yes    Types: Marijuana, Cocaine  . Sexual activity: Yes    Birth control/protection: Condom  Other Topics Concern  . Not on file  Social History Narrative  . Not on file   Social Determinants of Health   Financial Resource Strain:   . Difficulty of Paying Living Expenses: Not on file  Food Insecurity:   . Worried About Programme researcher, broadcasting/film/video in the Last Year: Not on file  . Ran Out of Food in the Last Year: Not on file  Transportation Needs:   . Lack of Transportation (Medical): Not on file  . Lack of Transportation (Non-Medical): Not  on file  Physical Activity:   . Days of Exercise per Week: Not on file  . Minutes of Exercise per Session: Not on file  Stress:   . Feeling of Stress : Not on file  Social Connections:   . Frequency of Communication with Friends and Family: Not on file  . Frequency of Social Gatherings with Friends and Family: Not on file  . Attends Religious Services: Not on file  . Active Member of Clubs or Organizations: Not on file  . Attends Banker Meetings: Not on file  . Marital Status: Not on file  Intimate Partner Violence:   . Fear of Current or Ex-Partner: Not on file  . Emotionally Abused: Not on file  . Physically Abused: Not on file  . Sexually Abused: Not on file    Allergies    Patient has no known allergies.  Family history:   History reviewed. No pertinent family history.  Current Medications:   Prior to Admission medications   Medication Sig Start Date End Date Taking? Authorizing Provider  ibuprofen (ADVIL) 800 MG tablet Take 1 tablet (800 mg total) by mouth 3 (three) times daily. 08/08/19   Alwyn Ren, MD    Physical Exam:   Vitals:   10/25/19 0107 10/25/19 0700 10/25/19 0730 10/25/19 0839  BP: (!) 100/55 118/71 116/71   Pulse: 88 75 67   Resp: 20 17 18    Temp:    (!) 97.4 F (36.3 C)  TempSrc:    Oral  SpO2: 98% 100% 99%   Weight:      Height:         Physical Exam: Blood pressure 116/71, pulse 67, temperature (!) 97.4 F (36.3 C), temperature source Oral, resp. rate 18, height 6' (1.829 m), weight 77.9 kg, SpO2 99 %. Gen: Sleepy patient lying in bed who initially refuses to talk to me but after multiple attempts opens his eyes and will answer a few questions.  He answers questions coherently and does not seem confused or particularly somnolent just uncooperative. Eyes: sclera anicteric, conjuctiva mildly injected bilaterally CVS: Regular, 3/6 high-pitched blowing holosystolic murmur heard throughout left precordium loudest at apex with minimal radiation to the axilla. Respiratory:  decreased air entry likely secondary to decreased inspiratory effort, no rales. GI: NABS, soft, NT, no right upper quadrant tenderness, Murphy sign is negative. LE: No edema. No cyanosis, no splinter hemorrhages noted. Neuro: Occult to assess given patient's noncooperation Psych: Patient is uncooperative   Data Review:    Labs: Basic Metabolic Panel: Recent Labs  Lab 10/24/19 1452 10/25/19 0630  NA 139 139  K 4.0 4.0  CL 103 107  CO2 23 25  GLUCOSE 131* 90  BUN 10 11  CREATININE 1.32* 0.77  CALCIUM 9.0 8.5*   Liver Function Tests: Recent Labs  Lab 10/25/19 0630  AST 48*  ALT 48*  ALKPHOS 63  BILITOT 0.6  PROT 5.7*  ALBUMIN  3.4*   No results for input(s): LIPASE, AMYLASE in the last 168 hours. No results for input(s): AMMONIA in the last 168 hours. CBC: Recent Labs  Lab 10/24/19 1452  WBC 15.5*  HGB 13.5  HCT 41.9  MCV 87.5  PLT 209   Cardiac Enzymes: Recent Labs  Lab 10/25/19 0427  CKTOTAL 37*    BNP (last 3 results) No results for input(s): PROBNP in the last 8760 hours. CBG: No results for input(s): GLUCAP in the last 168 hours.  Urinalysis    Component Value Date/Time  COLORURINE YELLOW 07/05/2018 2024   APPEARANCEUR CLEAR 07/05/2018 2024   LABSPEC 1.013 07/05/2018 2024   PHURINE 6.0 07/05/2018 2024   GLUCOSEU NEGATIVE 07/05/2018 2024   HGBUR NEGATIVE 07/05/2018 2024   BILIRUBINUR NEGATIVE 07/05/2018 2024   KETONESUR NEGATIVE 07/05/2018 2024   PROTEINUR NEGATIVE 07/05/2018 2024   NITRITE NEGATIVE 07/05/2018 2024   LEUKOCYTESUR NEGATIVE 07/05/2018 2024      Radiographic Studies: DG Chest 2 View  Result Date: 10/25/2019 CLINICAL DATA:  Endocarditis. Weakness. Pain in both legs. Fever and neck pain. EXAM: CHEST - 2 VIEW COMPARISON:  08/05/2019 FINDINGS: The heart size and mediastinal contours are within normal limits. Both lungs are clear. The visualized skeletal structures are unremarkable. IMPRESSION: No active cardiopulmonary disease. Electronically Signed   By: Burman Nieves M.D.   On: 10/25/2019 04:10   DG Lumbar Spine Complete  Result Date: 10/25/2019 CLINICAL DATA:  Endocarditis. Weakness and pain in both legs. Fever and neck pain. EXAM: LUMBAR SPINE - COMPLETE 4+ VIEW COMPARISON:  None. FINDINGS: Five lumbar type vertebral bodies. Normal alignment. No compression deformities. No focal bone lesion or bone destruction. Disc space heights are preserved. Visualized sacrum appears intact. IMPRESSION: Negative. Electronically Signed   By: Burman Nieves M.D.   On: 10/25/2019 05:24    EKG: Independently reviewed.  Sinus rhythm at 100.  Normal intervals.  Normal axis no LAD.   Very high voltage.  No acute ST-T wave changes.   Assessment/Plan:   Principal Problem:   Endocarditis Active Problems:   IVDU (intravenous drug user)/Opiates/Cocaine   Polysubstance abuse /Cocaine and Opiates  24 year old man with IVDU and history of recurrent/untreated endocarditis with severe MR now presents with nonspecific symptoms.  Patient is afebrile here.  He does however have a new leukocytosis.  General malaise and reported fever with leukocytosis Symptoms that he tells [place] could very possibly be recurrent/untreated endocarditis I have no other clear source of fever/leukocytosis (although he is afebrile here) We will keep patient on vancomycin Will request ID consultation Avoid antiplatelets and anticoagulation given propensity of possible emboli to travel to the brain, patient placed on SCD for DVT prophylaxis.  Severe MR Patient does not appear to be in decompensated heart failure, lungs are clear, he denies shortness of breath or DOE. Cardiology consult requested TTE ordered TEE per cardiology as warranted  Polysubstance abuse Medicine reconciliation shows Suboxone and very high doses of Valium Will continue Suboxone I have made multiple attempts to call Dr. Robyn [place] from [facility] addiction clinic who apparently prescribes Valium 25 mg 3 times daily however I am unable to actually find a phone number for her, I have gotten the run around from multiple different people where phone numbers have been changed and place where she used to work. Will keep patient on Valium 10 mg 3 times daily which should prevent withdrawal seizures, discussed with neurology here.     Other information:   DVT prophylaxis: SCD ordered Code Status: Full Family Communication: None Disposition Plan: Home Consults called: Infectious disease, cardiology Admission status: Inpatient  Alyscia Carmon Tubl[place] [facility]  If 7PM-7AM, please contact  night-coverage [email or web address] Password [facility] 10/25/2019, 9:27 AM

## 2019-10-25 NOTE — Plan of Care (Signed)
  Problem: Education: Goal: Knowledge of General Education information will improve Description: Including pain rating scale, medication(s)/side effects and non-pharmacologic comfort measures 10/25/2019 1801 by Saunders Revel, RN Outcome: Progressing 10/25/2019 1759 by Saunders Revel, RN Outcome: Progressing   Problem: Fluid Volume: Goal: Hemodynamic stability will improve 10/25/2019 1801 by Saunders Revel, RN Outcome: Progressing 10/25/2019 1759 by Saunders Revel, RN Outcome: Progressing   Problem: Clinical Measurements: Goal: Diagnostic test results will improve 10/25/2019 1801 by Saunders Revel, RN Outcome: Progressing 10/25/2019 1759 by Saunders Revel, RN Outcome: Progressing Goal: Signs and symptoms of infection will decrease 10/25/2019 1801 by Saunders Revel, RN Outcome: Progressing 10/25/2019 1759 by Saunders Revel, RN Outcome: Progressing   Problem: Respiratory: Goal: Ability to maintain adequate ventilation will improve 10/25/2019 1801 by Saunders Revel, RN Outcome: Progressing 10/25/2019 1759 by Saunders Revel, RN Outcome: Progressing   Problem: Education: Goal: Knowledge of General Education information will improve Description: Including pain rating scale, medication(s)/side effects and non-pharmacologic comfort measures 10/25/2019 1801 by Saunders Revel, RN Outcome: Progressing 10/25/2019 1759 by Saunders Revel, RN Outcome: Progressing   Problem: Health Behavior/Discharge Planning: Goal: Ability to manage health-related needs will improve 10/25/2019 1801 by Saunders Revel, RN Outcome: Progressing 10/25/2019 1759 by Saunders Revel, RN Outcome: Progressing   Problem: Clinical Measurements: Goal: Ability to maintain clinical measurements within normal limits will improve 10/25/2019 1801 by Saunders Revel, RN Outcome: Progressing 10/25/2019 1759 by Saunders Revel, RN Outcome: Progressing Goal: Will remain  free from infection 10/25/2019 1801 by Saunders Revel, RN Outcome: Progressing 10/25/2019 1759 by Saunders Revel, RN Outcome: Progressing Goal: Diagnostic test results will improve 10/25/2019 1801 by Saunders Revel, RN Outcome: Progressing 10/25/2019 1759 by Saunders Revel, RN Outcome: Progressing Goal: Respiratory complications will improve 10/25/2019 1801 by Saunders Revel, RN Outcome: Progressing 10/25/2019 1759 by Saunders Revel, RN Outcome: Progressing Goal: Cardiovascular complication will be avoided 10/25/2019 1801 by Saunders Revel, RN Outcome: Progressing 10/25/2019 1759 by Saunders Revel, RN Outcome: Progressing   Problem: Activity: Goal: Risk for activity intolerance will decrease 10/25/2019 1801 by Saunders Revel, RN Outcome: Progressing 10/25/2019 1759 by Saunders Revel, RN Outcome: Progressing   Problem: Nutrition: Goal: Adequate nutrition will be maintained 10/25/2019 1801 by Saunders Revel, RN Outcome: Progressing 10/25/2019 1759 by Saunders Revel, RN Outcome: Progressing   Problem: Coping: Goal: Level of anxiety will decrease 10/25/2019 1801 by Saunders Revel, RN Outcome: Progressing 10/25/2019 1759 by Saunders Revel, RN Outcome: Progressing   Problem: Elimination: Goal: Will not experience complications related to bowel motility 10/25/2019 1801 by Saunders Revel, RN Outcome: Progressing 10/25/2019 1759 by Saunders Revel, RN Outcome: Progressing Goal: Will not experience complications related to urinary retention 10/25/2019 1801 by Saunders Revel, RN Outcome: Progressing 10/25/2019 1759 by Saunders Revel, RN Outcome: Progressing   Problem: Pain Managment: Goal: General experience of comfort will improve 10/25/2019 1801 by Saunders Revel, RN Outcome: Progressing 10/25/2019 1759 by Saunders Revel, RN Outcome: Progressing   Problem: Safety: Goal: Ability to remain free from injury  will improve 10/25/2019 1801 by Saunders Revel, RN Outcome: Progressing 10/25/2019 1759 by Saunders Revel, RN Outcome: Progressing   Problem: Skin Integrity: Goal: Risk for impaired skin integrity will decrease 10/25/2019 1801 by Saunders Revel, RN Outcome: Progressing 10/25/2019 1759 by Saunders Revel, RN Outcome: Progressing

## 2019-10-25 NOTE — Plan of Care (Signed)
Problem: Education: Goal: Knowledge of General Education information will improve Description: Including pain rating scale, medication(s)/side effects and non-pharmacologic comfort measures 10/25/2019 1801 by Saunders Revel, RN Outcome: Progressing 10/25/2019 1759 by Saunders Revel, RN Outcome: Progressing   Problem: Fluid Volume: Goal: Hemodynamic stability will improve 10/25/2019 1801 by Saunders Revel, RN Outcome: Progressing 10/25/2019 1801 by Saunders Revel, RN Outcome: Progressing 10/25/2019 1759 by Saunders Revel, RN Outcome: Progressing   Problem: Clinical Measurements: Goal: Diagnostic test results will improve 10/25/2019 1801 by Saunders Revel, RN Outcome: Progressing 10/25/2019 1801 by Saunders Revel, RN Outcome: Progressing 10/25/2019 1759 by Saunders Revel, RN Outcome: Progressing Goal: Signs and symptoms of infection will decrease 10/25/2019 1801 by Saunders Revel, RN Outcome: Progressing 10/25/2019 1801 by Saunders Revel, RN Outcome: Progressing 10/25/2019 1759 by Saunders Revel, RN Outcome: Progressing   Problem: Respiratory: Goal: Ability to maintain adequate ventilation will improve 10/25/2019 1801 by Saunders Revel, RN Outcome: Progressing 10/25/2019 1801 by Saunders Revel, RN Outcome: Progressing 10/25/2019 1759 by Saunders Revel, RN Outcome: Progressing   Problem: Education: Goal: Knowledge of General Education information will improve Description: Including pain rating scale, medication(s)/side effects and non-pharmacologic comfort measures 10/25/2019 1801 by Saunders Revel, RN Outcome: Progressing 10/25/2019 1759 by Saunders Revel, RN Outcome: Progressing   Problem: Health Behavior/Discharge Planning: Goal: Ability to manage health-related needs will improve 10/25/2019 1801 by Saunders Revel, RN Outcome: Progressing 10/25/2019 1759 by Saunders Revel, RN Outcome: Progressing    Problem: Clinical Measurements: Goal: Ability to maintain clinical measurements within normal limits will improve 10/25/2019 1801 by Saunders Revel, RN Outcome: Progressing 10/25/2019 1759 by Saunders Revel, RN Outcome: Progressing Goal: Will remain free from infection 10/25/2019 1801 by Saunders Revel, RN Outcome: Progressing 10/25/2019 1759 by Saunders Revel, RN Outcome: Progressing Goal: Diagnostic test results will improve 10/25/2019 1801 by Saunders Revel, RN Outcome: Progressing 10/25/2019 1759 by Saunders Revel, RN Outcome: Progressing Goal: Respiratory complications will improve 10/25/2019 1801 by Saunders Revel, RN Outcome: Progressing 10/25/2019 1759 by Saunders Revel, RN Outcome: Progressing Goal: Cardiovascular complication will be avoided 10/25/2019 1801 by Saunders Revel, RN Outcome: Progressing 10/25/2019 1759 by Saunders Revel, RN Outcome: Progressing   Problem: Activity: Goal: Risk for activity intolerance will decrease 10/25/2019 1801 by Saunders Revel, RN Outcome: Progressing 10/25/2019 1759 by Saunders Revel, RN Outcome: Progressing   Problem: Nutrition: Goal: Adequate nutrition will be maintained 10/25/2019 1801 by Saunders Revel, RN Outcome: Progressing 10/25/2019 1759 by Saunders Revel, RN Outcome: Progressing   Problem: Coping: Goal: Level of anxiety will decrease 10/25/2019 1801 by Saunders Revel, RN Outcome: Progressing 10/25/2019 1759 by Saunders Revel, RN Outcome: Progressing   Problem: Elimination: Goal: Will not experience complications related to bowel motility 10/25/2019 1801 by Saunders Revel, RN Outcome: Progressing 10/25/2019 1759 by Saunders Revel, RN Outcome: Progressing Goal: Will not experience complications related to urinary retention 10/25/2019 1801 by Saunders Revel, RN Outcome: Progressing 10/25/2019 1759 by Saunders Revel, RN Outcome: Progressing    Problem: Pain Managment: Goal: General experience of comfort will improve 10/25/2019 1801 by Saunders Revel, RN Outcome: Progressing 10/25/2019 1759 by Saunders Revel, RN Outcome: Progressing   Problem: Safety: Goal: Ability to remain free from injury will improve 10/25/2019 1801 by Saunders Revel, RN Outcome: Progressing 10/25/2019 1759 by Saunders Revel, RN Outcome: Progressing   Problem: Skin  Integrity: Goal: Risk for impaired skin integrity will decrease 10/25/2019 1801 by Saunders Revel, RN Outcome: Progressing 10/25/2019 1759 by Saunders Revel, RN Outcome: Progressing

## 2019-10-25 NOTE — Consult Note (Signed)
[facility]       Reason for Consult: weakness    Referring Physician: Dr. Luberta Robertson  Principal Problem:   Endocarditis Active Problems:   IVDU (intravenous drug user)/Opiates/Cocaine   Polysubstance abuse /Cocaine and Opiates   . sodium chloride flush  3 mL Intravenous Q12H    Recommendations: Supportive care Will stop antibiotics  TTE to evaluate valve dysfunction with previous endocarditis  Assessment: He came in feeling poorly for weeks with some chills the last week.  No fever.  He denies injecting drugs, only smoking cocaine. He is not altered, BPok, no significant tacycardia, normal lactate.  Not c/w sepsis and no concerns for infection at this time.  Consider cardiac work up such an echo (already ordered).    Dr. Ninetta Lights is available over the weekend if needed, otherwise Dr. Orvan Falconer will follow up on Monday.   Antibiotics: Vancomycin and cefepime  HPI: Timothy Christensen is a 24 y.o. 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  He had left AMA previously and was told at [facility] at that time he would likely need valve replacement at some point.  TTE at that time here did not find any vegetation, only that it couldn't be 'ruled out'.     Review of Systems:  Constitutional: positive for chills or negative for fevers and anorexia Respiratory: negative for cough or sputum Gastrointestinal: negative for nausea and diarrhea Integument/breast: negative for rash Musculoskeletal: negative for myalgias and arthralgias All other systems reviewed and are negative    Past Medical History:  Diagnosis Date  . Endocarditis     Social History   Tobacco Use  . Smoking status: Current Every Day Smoker    Packs/day: 0.50    Types: Cigarettes  . Smokeless tobacco: Never Used  Vaping Use  . Vaping Use: Never used    Substance Use Topics  . Alcohol use: No  . Drug use: Yes    Types: Marijuana, Cocaine    FMH: no pertinent family history  No Known Allergies  Physical Exam: Constitutional: in no apparent distress  Vitals:   10/25/19 0730 10/25/19 0839  BP: 116/71   Pulse: 67   Resp: 18   Temp:  (!) 97.4 F (36.3 C)  SpO2: 99%    EYES: anicteric Cardiovascular: Cor RRR and 2/6 Holosystolic murmur Respiratory: clear; GI: Bowel sounds are normal, liver is not enlarged, spleen is not enlarged Musculoskeletal: no edema Skin: negatives: no rash Neuro: non-focal  Lab Results  Component Value Date   WBC 15.5 (H) 10/24/2019   HGB 13.5 10/24/2019   HCT 41.9 10/24/2019   MCV 87.5 10/24/2019   PLT 209 10/24/2019    Lab Results  Component Value Date   CREATININE 0.77 10/25/2019   BUN 11 10/25/2019   NA 139 10/25/2019   K 4.0 10/25/2019   CL 107 10/25/2019   CO2 25 10/25/2019    Lab Results  Component Value Date   ALT 48 (H) 10/25/2019   AST 48 (H) 10/25/2019   ALKPHOS 63 10/25/2019     Microbiology: Recent Results (from the past 240 hour(s))  SARS Coronavirus 2 by RT PCR (hospital order, performed in [facility] hospital lab) Nasopharyngeal Nasopharyngeal Swab     Status: None   Collection Time: 10/25/19  4:27 AM   Specimen: Nasopharyngeal Swab  Result Value Ref  Range Status   SARS Coronavirus 2 NEGATIVE NEGATIVE Final    Comment: (NOTE) SARS-CoV-2 target nucleic acids are NOT DETECTED.  The SARS-CoV-2 RNA is generally detectable in upper and lower respiratory specimens during the acute phase of infection. The lowest concentration of SARS-CoV-2 viral copies this assay can detect is 250 copies / mL. A negative result does not preclude SARS-CoV-2 infection and should not be used as the sole basis for treatment or other patient management decisions.  A negative result may occur with improper specimen collection / handling, submission of specimen other than nasopharyngeal  swab, presence of viral mutation(s) within the areas targeted by this assay, and inadequate number of viral copies (<250 copies / mL). A negative result must be combined with clinical observations, patient history, and epidemiological information.  Fact Sheet for Patients:   BoilerBrush.com.cy  Fact Sheet for Healthcare Providers: https://pope.com/  This test is not yet approved or  cleared by the [place] FDA and has been authorized for detection and/or diagnosis of SARS-CoV-2 by FDA under an Emergency Use Authorization (EUA).  This EUA will remain in effect (meaning this test can be used) for the duration of the COVID-19 declaration under Section 564(b)(1) of the Act, 21 U.S.C. section 360bbb-3(b)(1), unless the authorization is terminated or revoked sooner.  Performed at [facility], [address], [place], [place] [address]     Gardiner Barefoot, MD [facility] [email or web address] 10/25/2019, 1:54 PM

## 2019-10-25 NOTE — Plan of Care (Signed)
Problem: Education: Goal: Knowledge of General Education information will improve Description: Including pain rating scale, medication(s)/side effects and non-pharmacologic comfort measures 10/25/2019 1802 by Saunders Revel, RN Outcome: Progressing 10/25/2019 1801 by Saunders Revel, RN Outcome: Progressing 10/25/2019 1759 by Saunders Revel, RN Outcome: Progressing   Problem: Fluid Volume: Goal: Hemodynamic stability will improve 10/25/2019 1802 by Saunders Revel, RN Outcome: Progressing 10/25/2019 1801 by Saunders Revel, RN Outcome: Progressing 10/25/2019 1801 by Saunders Revel, RN Outcome: Progressing 10/25/2019 1759 by Saunders Revel, RN Outcome: Progressing   Problem: Clinical Measurements: Goal: Diagnostic test results will improve 10/25/2019 1802 by Saunders Revel, RN Outcome: Progressing 10/25/2019 1801 by Saunders Revel, RN Outcome: Progressing 10/25/2019 1801 by Saunders Revel, RN Outcome: Progressing 10/25/2019 1759 by Saunders Revel, RN Outcome: Progressing Goal: Signs and symptoms of infection will decrease 10/25/2019 1802 by Saunders Revel, RN Outcome: Progressing 10/25/2019 1801 by Saunders Revel, RN Outcome: Progressing 10/25/2019 1801 by Saunders Revel, RN Outcome: Progressing 10/25/2019 1759 by Saunders Revel, RN Outcome: Progressing   Problem: Respiratory: Goal: Ability to maintain adequate ventilation will improve 10/25/2019 1802 by Saunders Revel, RN Outcome: Progressing 10/25/2019 1801 by Saunders Revel, RN Outcome: Progressing 10/25/2019 1801 by Saunders Revel, RN Outcome: Progressing 10/25/2019 1759 by Saunders Revel, RN Outcome: Progressing   Problem: Education: Goal: Knowledge of General Education information will improve Description: Including pain rating scale, medication(s)/side effects and non-pharmacologic comfort measures 10/25/2019 1802 by Saunders Revel, RN Outcome:  Progressing 10/25/2019 1801 by Saunders Revel, RN Outcome: Progressing 10/25/2019 1759 by Saunders Revel, RN Outcome: Progressing   Problem: Health Behavior/Discharge Planning: Goal: Ability to manage health-related needs will improve 10/25/2019 1802 by Saunders Revel, RN Outcome: Progressing 10/25/2019 1801 by Saunders Revel, RN Outcome: Progressing 10/25/2019 1759 by Saunders Revel, RN Outcome: Progressing   Problem: Clinical Measurements: Goal: Ability to maintain clinical measurements within normal limits will improve 10/25/2019 1802 by Saunders Revel, RN Outcome: Progressing 10/25/2019 1801 by Saunders Revel, RN Outcome: Progressing 10/25/2019 1759 by Saunders Revel, RN Outcome: Progressing Goal: Will remain free from infection 10/25/2019 1802 by Saunders Revel, RN Outcome: Progressing 10/25/2019 1801 by Saunders Revel, RN Outcome: Progressing 10/25/2019 1759 by Saunders Revel, RN Outcome: Progressing Goal: Diagnostic test results will improve 10/25/2019 1802 by Saunders Revel, RN Outcome: Progressing 10/25/2019 1801 by Saunders Revel, RN Outcome: Progressing 10/25/2019 1759 by Saunders Revel, RN Outcome: Progressing Goal: Respiratory complications will improve 10/25/2019 1802 by Saunders Revel, RN Outcome: Progressing 10/25/2019 1801 by Saunders Revel, RN Outcome: Progressing 10/25/2019 1759 by Saunders Revel, RN Outcome: Progressing Goal: Cardiovascular complication will be avoided 10/25/2019 1802 by Saunders Revel, RN Outcome: Progressing 10/25/2019 1801 by Saunders Revel, RN Outcome: Progressing 10/25/2019 1759 by Saunders Revel, RN Outcome: Progressing   Problem: Activity: Goal: Risk for activity intolerance will decrease 10/25/2019 1802 by Saunders Revel, RN Outcome: Progressing 10/25/2019 1801 by Saunders Revel, RN Outcome: Progressing 10/25/2019 1759 by Saunders Revel,  RN Outcome: Progressing   Problem: Nutrition: Goal: Adequate nutrition will be maintained 10/25/2019 1802 by Saunders Revel, RN Outcome: Progressing 10/25/2019 1801 by Saunders Revel, RN Outcome: Progressing 10/25/2019 1759 by Saunders Revel, RN Outcome: Progressing   Problem: Coping: Goal: Level of anxiety will decrease 10/25/2019 1802 by Saunders Revel, RN Outcome: Progressing 10/25/2019 1801 by Saunders Revel,  RN Outcome: Progressing 10/25/2019 1759 by Saunders Revel, RN Outcome: Progressing   Problem: Elimination: Goal: Will not experience complications related to bowel motility 10/25/2019 1802 by Saunders Revel, RN Outcome: Progressing 10/25/2019 1801 by Saunders Revel, RN Outcome: Progressing 10/25/2019 1759 by Saunders Revel, RN Outcome: Progressing Goal: Will not experience complications related to urinary retention 10/25/2019 1802 by Saunders Revel, RN Outcome: Progressing 10/25/2019 1801 by Saunders Revel, RN Outcome: Progressing 10/25/2019 1759 by Saunders Revel, RN Outcome: Progressing   Problem: Pain Managment: Goal: General experience of comfort will improve 10/25/2019 1802 by Saunders Revel, RN Outcome: Progressing 10/25/2019 1801 by Saunders Revel, RN Outcome: Progressing 10/25/2019 1759 by Saunders Revel, RN Outcome: Progressing   Problem: Safety: Goal: Ability to remain free from injury will improve 10/25/2019 1802 by Saunders Revel, RN Outcome: Progressing 10/25/2019 1801 by Saunders Revel, RN Outcome: Progressing 10/25/2019 1759 by Saunders Revel, RN Outcome: Progressing   Problem: Skin Integrity: Goal: Risk for impaired skin integrity will decrease 10/25/2019 1802 by Saunders Revel, RN Outcome: Progressing 10/25/2019 1801 by Saunders Revel, RN Outcome: Progressing 10/25/2019 1759 by Saunders Revel, RN Outcome: Progressing

## 2019-10-25 NOTE — Progress Notes (Signed)
  Echocardiogram 2D Echocardiogram has been performed.  Gerda Diss 10/25/2019, 3:43 PM

## 2019-10-25 NOTE — Plan of Care (Signed)
Problem: Education: Goal: Knowledge of General Education information will improve Description: Including pain rating scale, medication(s)/side effects and non-pharmacologic comfort measures 10/25/2019 1804 by Saunders Revel, RN Outcome: Progressing 10/25/2019 1802 by Saunders Revel, RN Outcome: Progressing 10/25/2019 1801 by Saunders Revel, RN Outcome: Progressing 10/25/2019 1759 by Saunders Revel, RN Outcome: Progressing   Problem: Fluid Volume: Goal: Hemodynamic stability will improve 10/25/2019 1804 by Saunders Revel, RN Outcome: Progressing 10/25/2019 1802 by Saunders Revel, RN Outcome: Progressing 10/25/2019 1801 by Saunders Revel, RN Outcome: Progressing 10/25/2019 1801 by Saunders Revel, RN Outcome: Progressing 10/25/2019 1759 by Saunders Revel, RN Outcome: Progressing   Problem: Clinical Measurements: Goal: Diagnostic test results will improve 10/25/2019 1804 by Saunders Revel, RN Outcome: Progressing 10/25/2019 1802 by Saunders Revel, RN Outcome: Progressing 10/25/2019 1801 by Saunders Revel, RN Outcome: Progressing 10/25/2019 1801 by Saunders Revel, RN Outcome: Progressing 10/25/2019 1759 by Saunders Revel, RN Outcome: Progressing Goal: Signs and symptoms of infection will decrease 10/25/2019 1804 by Saunders Revel, RN Outcome: Progressing 10/25/2019 1802 by Saunders Revel, RN Outcome: Progressing 10/25/2019 1801 by Saunders Revel, RN Outcome: Progressing 10/25/2019 1801 by Saunders Revel, RN Outcome: Progressing 10/25/2019 1759 by Saunders Revel, RN Outcome: Progressing   Problem: Respiratory: Goal: Ability to maintain adequate ventilation will improve 10/25/2019 1804 by Saunders Revel, RN Outcome: Progressing 10/25/2019 1802 by Saunders Revel, RN Outcome: Progressing 10/25/2019 1801 by Saunders Revel, RN Outcome: Progressing 10/25/2019 1801 by Saunders Revel, RN Outcome:  Progressing 10/25/2019 1759 by Saunders Revel, RN Outcome: Progressing   Problem: Education: Goal: Knowledge of General Education information will improve Description: Including pain rating scale, medication(s)/side effects and non-pharmacologic comfort measures 10/25/2019 1804 by Saunders Revel, RN Outcome: Progressing 10/25/2019 1802 by Saunders Revel, RN Outcome: Progressing 10/25/2019 1801 by Saunders Revel, RN Outcome: Progressing 10/25/2019 1759 by Saunders Revel, RN Outcome: Progressing   Problem: Health Behavior/Discharge Planning: Goal: Ability to manage health-related needs will improve 10/25/2019 1804 by Saunders Revel, RN Outcome: Progressing 10/25/2019 1802 by Saunders Revel, RN Outcome: Progressing 10/25/2019 1801 by Saunders Revel, RN Outcome: Progressing 10/25/2019 1759 by Saunders Revel, RN Outcome: Progressing   Problem: Clinical Measurements: Goal: Ability to maintain clinical measurements within normal limits will improve 10/25/2019 1804 by Saunders Revel, RN Outcome: Progressing 10/25/2019 1802 by Saunders Revel, RN Outcome: Progressing 10/25/2019 1801 by Saunders Revel, RN Outcome: Progressing 10/25/2019 1759 by Saunders Revel, RN Outcome: Progressing Goal: Will remain free from infection 10/25/2019 1804 by Saunders Revel, RN Outcome: Progressing 10/25/2019 1802 by Saunders Revel, RN Outcome: Progressing 10/25/2019 1801 by Saunders Revel, RN Outcome: Progressing 10/25/2019 1759 by Saunders Revel, RN Outcome: Progressing Goal: Diagnostic test results will improve 10/25/2019 1804 by Saunders Revel, RN Outcome: Progressing 10/25/2019 1802 by Saunders Revel, RN Outcome: Progressing 10/25/2019 1801 by Saunders Revel, RN Outcome: Progressing 10/25/2019 1759 by Saunders Revel, RN Outcome: Progressing Goal: Respiratory complications will improve 10/25/2019 1804 by Saunders Revel,  RN Outcome: Progressing 10/25/2019 1802 by Saunders Revel, RN Outcome: Progressing 10/25/2019 1801 by Saunders Revel, RN Outcome: Progressing 10/25/2019 1759 by Saunders Revel, RN Outcome: Progressing Goal: Cardiovascular complication will be avoided 10/25/2019 1804 by Saunders Revel, RN Outcome: Progressing 10/25/2019 1802 by Saunders Revel, RN Outcome: Progressing 10/25/2019 1801 by Saunders Revel, RN Outcome: Progressing 10/25/2019  1759 by Saunders Revel, RN Outcome: Progressing   Problem: Activity: Goal: Risk for activity intolerance will decrease 10/25/2019 1804 by Saunders Revel, RN Outcome: Progressing 10/25/2019 1802 by Saunders Revel, RN Outcome: Progressing 10/25/2019 1801 by Saunders Revel, RN Outcome: Progressing 10/25/2019 1759 by Saunders Revel, RN Outcome: Progressing   Problem: Nutrition: Goal: Adequate nutrition will be maintained 10/25/2019 1804 by Saunders Revel, RN Outcome: Progressing 10/25/2019 1802 by Saunders Revel, RN Outcome: Progressing 10/25/2019 1801 by Saunders Revel, RN Outcome: Progressing 10/25/2019 1759 by Saunders Revel, RN Outcome: Progressing   Problem: Coping: Goal: Level of anxiety will decrease 10/25/2019 1804 by Saunders Revel, RN Outcome: Progressing 10/25/2019 1802 by Saunders Revel, RN Outcome: Progressing 10/25/2019 1801 by Saunders Revel, RN Outcome: Progressing 10/25/2019 1759 by Saunders Revel, RN Outcome: Progressing   Problem: Elimination: Goal: Will not experience complications related to bowel motility 10/25/2019 1804 by Saunders Revel, RN Outcome: Progressing 10/25/2019 1802 by Saunders Revel, RN Outcome: Progressing 10/25/2019 1801 by Saunders Revel, RN Outcome: Progressing 10/25/2019 1759 by Saunders Revel, RN Outcome: Progressing Goal: Will not experience complications related to urinary retention 10/25/2019 1804 by Saunders Revel, RN Outcome: Progressing 10/25/2019 1802 by Saunders Revel, RN Outcome: Progressing 10/25/2019 1801 by Saunders Revel, RN Outcome: Progressing 10/25/2019 1759 by Saunders Revel, RN Outcome: Progressing   Problem: Pain Managment: Goal: General experience of comfort will improve 10/25/2019 1804 by Saunders Revel, RN Outcome: Progressing 10/25/2019 1802 by Saunders Revel, RN Outcome: Progressing 10/25/2019 1801 by Saunders Revel, RN Outcome: Progressing 10/25/2019 1759 by Saunders Revel, RN Outcome: Progressing   Problem: Safety: Goal: Ability to remain free from injury will improve 10/25/2019 1804 by Saunders Revel, RN Outcome: Progressing 10/25/2019 1802 by Saunders Revel, RN Outcome: Progressing 10/25/2019 1801 by Saunders Revel, RN Outcome: Progressing 10/25/2019 1759 by Saunders Revel, RN Outcome: Progressing   Problem: Skin Integrity: Goal: Risk for impaired skin integrity will decrease 10/25/2019 1804 by Saunders Revel, RN Outcome: Progressing 10/25/2019 1802 by Saunders Revel, RN Outcome: Progressing 10/25/2019 1801 by Saunders Revel, RN Outcome: Progressing 10/25/2019 1759 by Saunders Revel, RN Outcome: Progressing   Problem: Fluid Volume: Goal: Hemodynamic stability will improve Outcome: Progressing   Problem: Clinical Measurements: Goal: Diagnostic test results will improve Outcome: Progressing Goal: Signs and symptoms of infection will decrease Outcome: Progressing   Problem: Respiratory: Goal: Ability to maintain adequate ventilation will improve Outcome: Progressing

## 2019-10-25 NOTE — Progress Notes (Signed)
PHARMACY - PHYSICIAN COMMUNICATION CRITICAL VALUE ALERT - BLOOD CULTURE IDENTIFICATION (BCID)  Timothy Christensen is an 24 y.o. 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 He had left AMA previously and was told at [facility] at that time he would likely need valve replacement at some point.  TTE at that time here did not find any vegetation, only that it couldn't be 'ruled out.'   Patient received vancomycin/cefepime earlier today, but all antibiotics were d/c'd by ID this afternoon. Patient now growing MSSA in 2 of 4 BCx bottles (both from one set).  Name of physician (or Provider) Contacted: Shalhoub, G  Current antibiotics: none  Changes to prescribed antibiotics recommended: Add cefazolin 2g IV q8h per [facility] BCID protocol  Results for orders placed or performed during the hospital encounter of 10/24/19  Blood Culture ID Panel (Reflexed) (Collected: 10/25/2019  4:27 AM)  Result Value Ref Range   Enterococcus faecalis NOT DETECTED NOT DETECTED   Enterococcus Faecium NOT DETECTED NOT DETECTED   Listeria monocytogenes NOT DETECTED NOT DETECTED   Staphylococcus species DETECTED (A) NOT DETECTED   Staphylococcus aureus (BCID) DETECTED (A) NOT DETECTED   Staphylococcus epidermidis NOT DETECTED NOT DETECTED   Staphylococcus lugdunensis NOT DETECTED NOT DETECTED   Streptococcus species NOT DETECTED NOT DETECTED   Streptococcus agalactiae NOT DETECTED NOT DETECTED   Streptococcus pneumoniae NOT DETECTED NOT DETECTED   Streptococcus pyogenes NOT DETECTED NOT DETECTED   A.calcoaceticus-baumannii NOT DETECTED NOT DETECTED   Bacteroides fragilis NOT DETECTED NOT DETECTED   Enterobacterales NOT DETECTED NOT DETECTED   Enterobacter cloacae complex NOT DETECTED NOT DETECTED   Escherichia coli NOT  DETECTED NOT DETECTED   Klebsiella aerogenes NOT DETECTED NOT DETECTED   Klebsiella oxytoca NOT DETECTED NOT DETECTED   Klebsiella pneumoniae NOT DETECTED NOT DETECTED   Proteus species NOT DETECTED NOT DETECTED   Salmonella species NOT DETECTED NOT DETECTED   Serratia marcescens NOT DETECTED NOT DETECTED   Haemophilus influenzae NOT DETECTED NOT DETECTED   Neisseria meningitidis NOT DETECTED NOT DETECTED   Pseudomonas aeruginosa NOT DETECTED NOT DETECTED   Stenotrophomonas maltophilia NOT DETECTED NOT DETECTED   Candida albicans NOT DETECTED NOT DETECTED   Candida auris NOT DETECTED NOT DETECTED   Candida glabrata NOT DETECTED NOT DETECTED   Candida krusei NOT DETECTED NOT DETECTED   Candida parapsilosis NOT DETECTED NOT DETECTED   Candida tropicalis NOT DETECTED NOT DETECTED   Cryptococcus neoformans/gattii NOT DETECTED NOT DETECTED   Meth resistant mecA/C and MREJ NOT DETECTED NOT DETECTED    Toney Rakes Von Dohlen 10/25/2019  10:03 PM

## 2019-10-25 NOTE — ED Notes (Signed)
Pt will not get up and we have asked him 4 times, warned him that if he did not get up to go to the treatment room we would give it to someone else. He just opens his eyes, closes them then will not respond. Security is aware and is trying to get him up to leave. Tech is currently assessing vitals to be sure there is no reason for pt to fail to comply.

## 2019-10-26 DIAGNOSIS — I330 Acute and subacute infective endocarditis: Principal | ICD-10-CM

## 2019-10-26 DIAGNOSIS — R7881 Bacteremia: Secondary | ICD-10-CM

## 2019-10-26 DIAGNOSIS — F1990 Other psychoactive substance use, unspecified, uncomplicated: Secondary | ICD-10-CM

## 2019-10-26 DIAGNOSIS — B9561 Methicillin susceptible Staphylococcus aureus infection as the cause of diseases classified elsewhere: Secondary | ICD-10-CM

## 2019-10-26 LAB — HIV ANTIBODY (ROUTINE TESTING W REFLEX): HIV Screen 4th Generation wRfx: NONREACTIVE

## 2019-10-26 LAB — CBC
HCT: 38.8 % — ABNORMAL LOW (ref 39.0–52.0)
Hemoglobin: 12.3 g/dL — ABNORMAL LOW (ref 13.0–17.0)
MCH: 27.2 pg (ref 26.0–34.0)
MCHC: 31.7 g/dL (ref 30.0–36.0)
MCV: 85.7 fL (ref 80.0–100.0)
Platelets: 188 10*3/uL (ref 150–400)
RBC: 4.53 MIL/uL (ref 4.22–5.81)
RDW: 13.8 % (ref 11.5–15.5)
WBC: 5.6 10*3/uL (ref 4.0–10.5)
nRBC: 0 % (ref 0.0–0.2)

## 2019-10-26 LAB — COMPREHENSIVE METABOLIC PANEL
ALT: 32 U/L (ref 0–44)
AST: 20 U/L (ref 15–41)
Albumin: 3.1 g/dL — ABNORMAL LOW (ref 3.5–5.0)
Alkaline Phosphatase: 73 U/L (ref 38–126)
Anion gap: 10 (ref 5–15)
BUN: 12 mg/dL (ref 6–20)
CO2: 26 mmol/L (ref 22–32)
Calcium: 8.9 mg/dL (ref 8.9–10.3)
Chloride: 106 mmol/L (ref 98–111)
Creatinine, Ser: 0.81 mg/dL (ref 0.61–1.24)
GFR calc Af Amer: >60 mL/min (ref >60)
GFR calc non Af Amer: >60 mL/min (ref >60)
Glucose, Bld: 111 mg/dL — ABNORMAL HIGH (ref 70–99)
Potassium: 4.2 mmol/L (ref 3.5–5.1)
Sodium: 142 mmol/L (ref 135–145)
Total Bilirubin: 0.4 mg/dL (ref 0.3–1.2)
Total Protein: 5.5 g/dL — ABNORMAL LOW (ref 6.5–8.1)

## 2019-10-26 LAB — URINE CULTURE
Culture: NO GROWTH
Special Requests: NORMAL

## 2019-10-26 LAB — HEPATITIS PANEL, ACUTE
HCV Ab: NONREACTIVE
Hep A IgM: NONREACTIVE
Hep B C IgM: NONREACTIVE
Hepatitis B Surface Ag: NONREACTIVE

## 2019-10-26 MED ORDER — DIAZEPAM 5 MG PO TABS
25.0000 mg | ORAL_TABLET | Freq: Three times a day (TID) | ORAL | Status: DC
  Administered 2019-10-26 – 2019-10-29 (×10): 25 mg via ORAL
  Filled 2019-10-26 (×10): qty 5

## 2019-10-26 MED ORDER — ALUM & MAG HYDROXIDE-SIMETH 200-200-20 MG/5ML PO SUSP
30.0000 mL | Freq: Four times a day (QID) | ORAL | Status: DC | PRN
  Administered 2019-10-26: 30 mL via ORAL
  Filled 2019-10-26 (×2): qty 30

## 2019-10-26 MED ORDER — PANTOPRAZOLE SODIUM 40 MG PO TBEC
40.0000 mg | DELAYED_RELEASE_TABLET | Freq: Every day | ORAL | Status: DC
  Administered 2019-10-26 – 2019-10-27 (×2): 40 mg via ORAL
  Filled 2019-10-26 (×2): qty 1

## 2019-10-26 MED ORDER — ALUM & MAG HYDROXIDE-SIMETH 200-200-20 MG/5ML PO SUSP
30.0000 mL | Freq: Once | ORAL | Status: AC
  Administered 2019-10-26: 30 mL via ORAL
  Filled 2019-10-26: qty 30

## 2019-10-26 NOTE — Progress Notes (Signed)
PROGRESS NOTE    Mumin Denomme  ZOX:[id number] DOB: Nov 13, 1995 DOA: 10/24/2019 PCP: Patient, No Pcp Per    Brief Narrative: 24 years old male with PMH significant for polysubstance IV drug abuse, History of partially or untreated infective endocarditis( stated he was treated with oral antibiotics) .  He was doing fine until 6 weeks ago when he started having this progressive shortness of breath.  He is found to have positive blood cultures for MSSA , Echocardiogram shows reduced EF 40-45% but no vegetation.  Cardiology is consulted to do TEE.  Patient was vancomycin and ceftriaxone transitioned to Ancef. Patient has been very demanding with regards to polysubstance abuse, He is taking very high doses of Valium 25 mg 3 times daily and Sub oxone.  There is a documentation dated he has left AGAINST MEDICAL ADVICE from [facility].  Assessment & Plan:   Principal Problem:   Endocarditis Active Problems:   IVDU (intravenous drug user)/Opiates/Cocaine   Polysubstance abuse /Cocaine and Opiates   General malaise and reported fever with leukocytosis: This could be recurrent/untreated endocarditis. Admitted for suspected infective endocarditis. Started on vancomycin and ceftriaxone. Blood cultures grew MSSA antibiotic transition to Ancef. ID consult appreciated. Echocardiogram shows EF 40 to 45%.  There is no vegetation. Cardiology consulted for TEE to rule out endocarditis. Avoid antiplatelets and anticoagulation given propensity of possible emboli to travel to the brain, patient placed on SCD for DVT prophylaxis.  Chronic systolic heart failure.: Patient does not appear to be in decompensated heart failure,  lungs are clear, he denies shortness of breath or DOE. Cardiology consulted, awaiting recommendation. TTE: LVEF 40-45% consistent with systolic heart failure. TEE per cardiology as warranted.  Polysubstance abuse Medicine reconciliation shows Suboxone and very high doses of Valium Will  continue Suboxone Patient is placed on his Valium 25 mg 3 times daily confirmed with [facility] pharmacy.  DVT prophylaxis: SCDs. Code Status: Full Family Communication:No one at bed side. Disposition Plan: Status is: Inpatient  Remains inpatient appropriate because:IV treatments appropriate due to intensity of illness or inability to take PO   Dispo: The patient is from: Home              Anticipated d/c is to: Home              Anticipated d/c date is: 3 days              Patient currently is not medically stable to d/c.      Consultants:   Infectious Diseases, Cardiology.  Procedures: TTE, schedule TEE  Antimicrobials:  Anti-infectives (From admission, onward)   Start     Dose/Rate Route Frequency Ordered Stop   10/25/19 2300  ceFAZolin (ANCEF) IVPB 2g/100 mL premix        2 g 200 mL/hr over 30 Minutes Intravenous Every 8 hours 10/25/19 2205     10/25/19 1400  vancomycin (VANCOCIN) IVPB 1000 mg/200 mL premix  Status:  Discontinued        1,000 mg 200 mL/hr over 60 Minutes Intravenous Every 8 hours 10/25/19 1047 10/25/19 1358   10/25/19 0630  vancomycin (VANCOREADY) IVPB 1500 mg/300 mL  Status:  Discontinued        1,500 mg 150 mL/hr over 120 Minutes Intravenous Every 24 hours 10/25/19 0618 10/25/19 0622   10/25/19 0630  ceFEPIme (MAXIPIME) 2 g in sodium chloride 0.9 % 100 mL IVPB  Status:  Discontinued        2 g 200 mL/hr over 30 Minutes Intravenous  Once 10/25/19 0618 10/25/19 1358   10/25/19 0430  vancomycin (VANCOCIN) IVPB 1000 mg/200 mL premix  Status:  Discontinued        1,000 mg 200 mL/hr over 60 Minutes Intravenous  Once 10/25/19 0415 10/25/19 0421   10/25/19 0430  ceFEPIme (MAXIPIME) 2 g in sodium chloride 0.9 % 100 mL IVPB        2 g 200 mL/hr over 30 Minutes Intravenous STAT 10/25/19 0416 10/25/19 0610   10/25/19 0430  vancomycin (VANCOREADY) IVPB 2000 mg/400 mL        2,000 mg 200 mL/hr over 120 Minutes Intravenous  Once 10/25/19 0421 10/25/19 0821      Subjective: Patient was seen and examined at bedside.  Overnight events noted.  Patient is very demanding asking to increase his Valium to his home dosing that is 25 mg 3 times a day.  He also wanted Suboxone to be increased.  He denies any chest pain shortness of breath.  Objective: Vitals:   10/25/19 1800 10/25/19 2037 10/26/19 0900 10/26/19 1136  BP: 106/84 110/60 104/63 107/73  Pulse: 82 98 88 94  Resp:  15  18  Temp:  98.6 F (37 C) 98.3 F (36.8 C) 98.1 F (36.7 C)  TempSrc:  Oral Oral Oral  SpO2:  99% 97% 99%  Weight:      Height:        Intake/Output Summary (Last 24 hours) at 10/26/2019 1612 Last data filed at 10/26/2019 1400 Gross per 24 hour  Intake 2663.93 ml  Output --  Net 2663.93 ml   Filed Weights   10/24/19 1451 10/25/19 1702  Weight: 77.9 kg 65.8 kg    Examination:  General exam: Appears calm and comfortable  Respiratory system: Clear to auscultation. Respiratory effort normal. Cardiovascular system: S1 & S2 heard, RRR. No JVD, murmurs, rubs, gallops or clicks. No pedal edema. Gastrointestinal system: Abdomen is nondistended, soft and nontender. No organomegaly or masses felt. Normal bowel sounds heard. Central nervous system: Alert and oriented. No focal neurological deficits. Extremities: Symmetric 5 x 5 power. Skin: No rashes, lesions or ulcers Psychiatry: Judgement and insight appear normal. Mood & affect appropriate.     Data Reviewed: I have personally reviewed following labs and imaging studies  CBC: Recent Labs  Lab 10/24/19 1452 10/25/19 1701 10/26/19 0649  WBC 15.5* 9.4 5.6  NEUTROABS  --  8.4*  --   HGB 13.5 13.3 12.3*  HCT 41.9 42.0 38.8*  MCV 87.5 88.4 85.7  PLT 209 220 188   Basic Metabolic Panel: Recent Labs  Lab 10/24/19 1452 10/25/19 0630 10/26/19 0649  NA 139 139 142  K 4.0 4.0 4.2  CL 103 107 106  CO2 23 25 26   GLUCOSE 131* 90 111*  BUN 10 11 12   CREATININE 1.32* 0.77 0.81  CALCIUM 9.0 8.5* 8.9    GFR: Estimated Creatinine Clearance: 128.6 mL/min (by C-G formula based on SCr of 0.81 mg/dL). Liver Function Tests: Recent Labs  Lab 10/25/19 0630 10/26/19 0649  AST 48* 20  ALT 48* 32  ALKPHOS 63 73  BILITOT 0.6 0.4  PROT 5.7* 5.5*  ALBUMIN 3.4* 3.1*   No results for input(s): LIPASE, AMYLASE in the last 168 hours. No results for input(s): AMMONIA in the last 168 hours. Coagulation Profile: No results for input(s): INR, PROTIME in the last 168 hours. Cardiac Enzymes: Recent Labs  Lab 10/25/19 0427  CKTOTAL 37*   BNP (last 3 results) No results for input(s): PROBNP in the  last 8760 hours. HbA1C: No results for input(s): HGBA1C in the last 72 hours. CBG: No results for input(s): GLUCAP in the last 168 hours. Lipid Profile: No results for input(s): CHOL, HDL, LDLCALC, TRIG, CHOLHDL, LDLDIRECT in the last 72 hours. Thyroid Function Tests: No results for input(s): TSH, T4TOTAL, FREET4, T3FREE, THYROIDAB in the last 72 hours. Anemia Panel: No results for input(s): VITAMINB12, FOLATE, FERRITIN, TIBC, IRON, RETICCTPCT in the last 72 hours. Sepsis Labs: Recent Labs  Lab 10/25/19 0427 10/25/19 0630  PROCALCITON  --  4.90  LATICACIDVEN 1.5 0.8    Recent Results (from the past 240 hour(s))  Urine culture     Status: None   Collection Time: 10/25/19  3:35 AM   Specimen: Urine, Catheterized  Result Value Ref Range Status   Specimen Description URINE, CATHETERIZED  Final   Special Requests Normal  Final   Culture   Final    NO GROWTH Performed at [facility], [address], [place], [place] [address]    Report Status 10/26/2019 FINAL  Final  Blood culture (routine x 2)     Status: Abnormal (Preliminary result)   Collection Time: 10/25/19  4:27 AM   Specimen: BLOOD  Result Value Ref Range Status   Specimen Description BLOOD LEFT ANTECUBITAL  Final   Special Requests   Final    BOTTLES DRAWN AEROBIC AND ANAEROBIC Blood Culture adequate volume   Culture   Setup Time   Final    GRAM POSITIVE COCCI IN CLUSTERS IN BOTH AEROBIC AND ANAEROBIC BOTTLES Organism ID to follow CRITICAL RESULT CALLED TO, READ BACK BY AND VERIFIED WITH: H,VONDOHLEN PHARMD @2145  10/25/19 EB Performed at [facility], [address] [place], [place]    Culture STAPHYLOCOCCUS AUREUS (A)  Final   Report Status PENDING  Incomplete  SARS Coronavirus 2 by RT PCR (hospital order, performed in [facility] hospital lab) Nasopharyngeal Nasopharyngeal Swab     Status: None   Collection Time: 10/25/19  4:27 AM   Specimen: Nasopharyngeal Swab  Result Value Ref Range Status   SARS Coronavirus 2 NEGATIVE NEGATIVE Final    Comment: (NOTE) SARS-CoV-2 target nucleic acids are NOT DETECTED.  The SARS-CoV-2 RNA is generally detectable in upper and lower respiratory specimens during the acute phase of infection. The lowest concentration of SARS-CoV-2 viral copies this assay can detect is 250 copies / mL. A negative result does not preclude SARS-CoV-2 infection and should not be used as the sole basis for treatment or other patient management decisions.  A negative result may occur with improper specimen collection / handling, submission of specimen other than nasopharyngeal swab, presence of viral mutation(s) within the areas targeted by this assay, and inadequate number of viral copies (<250 copies / mL). A negative result must be combined with clinical observations, patient history, and epidemiological information.  Fact Sheet for Patients:   12/25/19  Fact Sheet for Healthcare Providers: BoilerBrush.com.cy  This test is not yet approved or  cleared by the https://pope.com/ FDA and has been authorized for detection and/or diagnosis of SARS-CoV-2 by FDA under an Emergency Use Authorization (EUA).  This EUA will remain in effect (meaning this test can be used) for the duration of the COVID-19 declaration under  Section 564(b)(1) of the Act, 21 U.S.C. section 360bbb-3(b)(1), unless the authorization is terminated or revoked sooner.  Performed at [facility], [address], [place], [place]   Blood Culture ID Panel (Reflexed)  Status: Abnormal   Collection Time: 10/25/19  4:27 AM  Result Value Ref Range Status   Enterococcus faecalis NOT DETECTED NOT DETECTED Final   Enterococcus Faecium NOT DETECTED NOT DETECTED Final   Listeria monocytogenes NOT DETECTED NOT DETECTED Final   Staphylococcus species DETECTED (A) NOT DETECTED Final    Comment: CRITICAL RESULT CALLED TO, READ BACK BY AND VERIFIED WITH: H,VONDOHLEN PHARMD @2145  10/25/19 EB    Staphylococcus aureus (BCID) DETECTED (A) NOT DETECTED Final    Comment: CRITICAL RESULT CALLED TO, READ BACK BY AND VERIFIED WITH: H,VONDOHLEN PHARMD @2145  10/25/19 EB    Staphylococcus epidermidis NOT DETECTED NOT DETECTED Final   Staphylococcus lugdunensis NOT DETECTED NOT DETECTED Final   Streptococcus species NOT DETECTED NOT DETECTED Final   Streptococcus agalactiae NOT DETECTED NOT DETECTED Final   Streptococcus pneumoniae NOT DETECTED NOT DETECTED Final   Streptococcus pyogenes NOT DETECTED NOT DETECTED Final   A.calcoaceticus-baumannii NOT DETECTED NOT DETECTED Final   Bacteroides fragilis NOT DETECTED NOT DETECTED Final   Enterobacterales NOT DETECTED NOT DETECTED Final   Enterobacter cloacae complex NOT DETECTED NOT DETECTED Final   Escherichia coli NOT DETECTED NOT DETECTED Final   Klebsiella aerogenes NOT DETECTED NOT DETECTED Final   Klebsiella oxytoca NOT DETECTED NOT DETECTED Final   Klebsiella pneumoniae NOT DETECTED NOT DETECTED Final   Proteus species NOT DETECTED NOT DETECTED Final   Salmonella species NOT DETECTED NOT DETECTED Final   Serratia marcescens NOT DETECTED NOT DETECTED Final   Haemophilus influenzae NOT DETECTED NOT DETECTED Final   Neisseria meningitidis NOT DETECTED NOT DETECTED Final   Pseudomonas  aeruginosa NOT DETECTED NOT DETECTED Final   Stenotrophomonas maltophilia NOT DETECTED NOT DETECTED Final   Candida albicans NOT DETECTED NOT DETECTED Final   Candida auris NOT DETECTED NOT DETECTED Final   Candida glabrata NOT DETECTED NOT DETECTED Final   Candida krusei NOT DETECTED NOT DETECTED Final   Candida parapsilosis NOT DETECTED NOT DETECTED Final   Candida tropicalis NOT DETECTED NOT DETECTED Final   Cryptococcus neoformans/gattii NOT DETECTED NOT DETECTED Final   Meth resistant mecA/C and MREJ NOT DETECTED NOT DETECTED Final    Comment: Performed at [facility], [address] [place], [place] [address]  Blood culture (routine x 2)     Status: None (Preliminary result)   Collection Time: 10/25/19  6:30 AM   Specimen: BLOOD RIGHT HAND  Result Value Ref Range Status   Specimen Description BLOOD RIGHT HAND  Final   Special Requests   Final    BOTTLES DRAWN AEROBIC AND ANAEROBIC Blood Culture adequate volume   Culture   Final    NO GROWTH 1 DAY Performed at [facility], [address] [place], [place] [address]    Report Status PENDING  Incomplete         Radiology Studies: DG Chest 2 View  Result Date: 10/25/2019 CLINICAL DATA:  Endocarditis. Weakness. Pain in both legs. Fever and neck pain. EXAM: CHEST - 2 VIEW COMPARISON:  08/05/2019 FINDINGS: The heart size and mediastinal contours are within normal limits. Both lungs are clear. The visualized skeletal structures are unremarkable. IMPRESSION: No active cardiopulmonary disease. Electronically Signed   By: Burman NievesWilliam  Stevens M.D.   On: 10/25/2019 04:10   DG Lumbar Spine Complete  Result Date: 10/25/2019 CLINICAL DATA:  Endocarditis. Weakness and pain in both legs. Fever and neck pain. EXAM: LUMBAR SPINE - COMPLETE 4+ VIEW COMPARISON:  None. FINDINGS: Five lumbar type vertebral bodies. Normal alignment.  No compression deformities. No focal bone lesion or bone destruction. Disc space heights are preserved.  Visualized sacrum appears intact. IMPRESSION: Negative. Electronically Signed   By: Burman Nieves M.D.   On: 10/25/2019 05:24   ECHOCARDIOGRAM COMPLETE  Result Date: 10/25/2019    ECHOCARDIOGRAM REPORT   Patient Name:   SAKIB NOGUEZ Date of Exam: 10/25/2019 Medical Rec #:  [id number]   Height:       72.0 in Accession #:    [id number]  Weight:       171.7 lb Date of Birth:  March 26, 1995    BSA:          1.997 m Patient Age:    24 years    BP:           71/67 mmHg Patient Gender: M           HR:           116 bpm. Exam Location:  Inpatient Procedure: 2D Echo, Cardiac Doppler and Color Doppler Indications:    Endocarditis  History:        Patient has prior history of Echocardiogram examinations, most                 recent 08/06/2019. Risk Factors:Current Smoker. Polysubstance                 abuse.  Sonographer:    Ross Ludwig RDCS (AE) Referring Phys: [id number] [facility] TUBLU CHATTERJEE  Sonographer Comments: Image acquisition challenging due to respiratory motion and patient movement. IMPRESSIONS  1. Suggest TEE for further assessment of his valves.  2. Left ventricular ejection fraction, by estimation, is 45 to 50%. The left ventricle has mildly decreased function. The left ventricle demonstrates global hypokinesis. Left ventricular diastolic parameters were normal.  3. Right ventricular systolic function is normal. The right ventricular size is mildly enlarged.  4. Right atrial size was mildly dilated.  5. The MR is eccentric and may be more severe than what it appears. The valve is thickened and I cannot rule out vegetation on the mitral valve. Suggest TEE for further assessment of his valves.     . The mitral valve is abnormal. Moderate to severe mitral valve regurgitation.  6. There appears to be a vegetation attached to the tricupsid valve.     . The tricuspid valve is abnormal. Tricuspid valve regurgitation is severe.  7. The aortic valve is normal in structure. Aortic valve regurgitation is not visualized.  FINDINGS  Left Ventricle: Left ventricular ejection fraction, by estimation, is 45 to 50%. The left ventricle has mildly decreased function. The left ventricle demonstrates global hypokinesis. The left ventricular internal cavity size was normal in size. There is  no left ventricular hypertrophy. Left ventricular diastolic parameters were normal. Right Ventricle: The right ventricular size is mildly enlarged. Right vetricular wall thickness was not well visualized. Right ventricular systolic function is normal. Left Atrium: Left atrial size was normal in size. Right Atrium: Right atrial size was mildly dilated. Pericardium: There is no evidence of pericardial effusion. Mitral Valve: The MR is eccentric and may be more severe than what it appears. The valve is thickened and I cannot rule out vegetation on the mitral valve. Suggest TEE for further assessment of his valves. The mitral valve is abnormal. There is moderate thickening of the mitral valve leaflet(s). Moderate to severe mitral valve regurgitation. Tricuspid Valve: There appears to be a vegetation attached to the tricupsid valve. The tricuspid valve is abnormal. Tricuspid valve  regurgitation is severe. Aortic Valve: The aortic valve is normal in structure. Aortic valve regurgitation is not visualized. Aortic valve mean gradient measures 26.7 mmHg. Aortic valve peak gradient measures 49.5 mmHg. Aortic valve area, by VTI measures 0.86 cm. Pulmonic Valve: The pulmonic valve was normal in structure. Pulmonic valve regurgitation is not visualized. Aorta: The aortic root and ascending aorta are structurally normal, with no evidence of dilitation. IAS/Shunts: The atrial septum is grossly normal. Additional Comments: Suggest TEE for further assessment of his valves.  LEFT VENTRICLE PLAX 2D LVIDd:         5.80 cm LVIDs:         4.10 cm LV PW:         1.00 cm LV IVS:        1.20 cm LVOT diam:     2.00 cm LV SV:         57 LV SV Index:   28 LVOT Area:     3.14 cm   RIGHT VENTRICLE             IVC RV Basal diam:  3.20 cm     IVC diam: 2.30 cm RV S prime:     12.80 cm/s TAPSE (M-mode): 2.6 cm LEFT ATRIUM             Index       RIGHT ATRIUM           Index LA diam:        3.50 cm 1.75 cm/m  RA Area:     19.20 cm LA Vol (A2C):   73.8 ml 36.96 ml/m RA Volume:   52.20 ml  26.14 ml/m LA Vol (A4C):   68.6 ml 34.35 ml/m LA Biplane Vol: 75.2 ml 37.66 ml/m  AORTIC VALVE AV Area (Vmax):    0.73 cm AV Area (Vmean):   0.82 cm AV Area (VTI):     0.86 cm AV Vmax:           351.67 cm/s AV Vmean:          233.333 cm/s AV VTI:            0.657 m AV Peak Grad:      49.5 mmHg AV Mean Grad:      26.7 mmHg LVOT Vmax:         82.20 cm/s LVOT Vmean:        61.200 cm/s LVOT VTI:          0.180 m LVOT/AV VTI ratio: 0.27  AORTA Ao Root diam: 3.80 cm Ao Asc diam:  3.20 cm MITRAL VALVE MV Area (PHT): 4.06 cm    SHUNTS MV Decel Time: 187 msec    Systemic VTI:  0.18 m MV E velocity: 99.40 cm/s  Systemic Diam: 2.00 cm MV A velocity: 49.30 cm/s MV E/A ratio:  2.02 Kristeen Miss MD Electronically signed by Kristeen Miss MD Signature Date/Time: 10/25/2019/5:04:40 PM    Final     Scheduled Meds: . buprenorphine-naloxone  1 tablet Sublingual BID  . diazepam  25 mg Oral TID  . gabapentin  400 mg Oral TID  . sodium chloride flush  3 mL Intravenous Q12H   Continuous Infusions: . sodium chloride    .  ceFAZolin (ANCEF) IV 2 g (10/26/19 1525)     LOS: 1 day    Time spent:25 mins.    Cipriano Bunker, MD [facility]   If 7PM-7AM, please contact night-coverage

## 2019-10-26 NOTE — Progress Notes (Signed)
Patient refuses to wear cardiac monitor; MD made aware.

## 2019-10-26 NOTE — Progress Notes (Signed)
Patient complains of heartburn. Maalox ordered. Patient states that he uses suboxone 3 times a day and wants the MD to change. MD states there will be no change at this time. Patient made aware.

## 2019-10-26 NOTE — Progress Notes (Signed)
Patient refused vital signs. Will reattempt at 0400.

## 2019-10-26 NOTE — Significant Event (Signed)
HOSPITAL MEDICINE OVERNIGHT EVENT NOTE    Notified by nursing that patient is complaining of midsternal chest discomfort.  Patient describes it as heartburn. Chart reviewed, it seems that patient was complaining of similar symptoms at approximately 4 PM this afternoon and was administered a dose of Maalox at that time.  I have asked that the patient undergo an EKG for reassurance that this is not cardiac.  Patient has refused stating "I know what is causing my pain".  We will try another dose of Maalox and place patient on scheduled daily Protonix.   Marinda Elk  MD [facility]

## 2019-10-26 NOTE — Progress Notes (Signed)
INFECTIOUS DISEASE PROGRESS NOTE  ID: Timothy Christensen is a 24 y.o. male with  Principal Problem:   Endocarditis Active Problems:   IVDU (intravenous drug user)/Opiates/Cocaine   Polysubstance abuse /Cocaine and Opiates  Subjective: Resting quietly, awakens transiently.  No complaints.   Abtx:  Anti-infectives (From admission, onward)   Start     Dose/Rate Route Frequency Ordered Stop   10/25/19 2300  ceFAZolin (ANCEF) IVPB 2g/100 mL premix        2 g 200 mL/hr over 30 Minutes Intravenous Every 8 hours 10/25/19 2205     10/25/19 1400  vancomycin (VANCOCIN) IVPB 1000 mg/200 mL premix  Status:  Discontinued        1,000 mg 200 mL/hr over 60 Minutes Intravenous Every 8 hours 10/25/19 1047 10/25/19 1358   10/25/19 0630  vancomycin (VANCOREADY) IVPB 1500 mg/300 mL  Status:  Discontinued        1,500 mg 150 mL/hr over 120 Minutes Intravenous Every 24 hours 10/25/19 0618 10/25/19 0622   10/25/19 0630  ceFEPIme (MAXIPIME) 2 g in sodium chloride 0.9 % 100 mL IVPB  Status:  Discontinued        2 g 200 mL/hr over 30 Minutes Intravenous  Once 10/25/19 0618 10/25/19 1358   10/25/19 0430  vancomycin (VANCOCIN) IVPB 1000 mg/200 mL premix  Status:  Discontinued        1,000 mg 200 mL/hr over 60 Minutes Intravenous  Once 10/25/19 0415 10/25/19 0421   10/25/19 0430  ceFEPIme (MAXIPIME) 2 g in sodium chloride 0.9 % 100 mL IVPB        2 g 200 mL/hr over 30 Minutes Intravenous STAT 10/25/19 0416 10/25/19 0610   10/25/19 0430  vancomycin (VANCOREADY) IVPB 2000 mg/400 mL        2,000 mg 200 mL/hr over 120 Minutes Intravenous  Once 10/25/19 0421 10/25/19 0821      Medications:  Scheduled: . buprenorphine-naloxone  1 tablet Sublingual BID  . diazepam  25 mg Oral TID  . gabapentin  400 mg Oral TID  . sodium chloride flush  3 mL Intravenous Q12H    Objective: Vital signs in last 24 hours: Temp:  [98.1 F (36.7 C)-98.6 F (37 C)] 98.1 F (36.7 C) (09/11 1136) Pulse Rate:  [78-105] 94 (09/11  1136) Resp:  [15-20] 18 (09/11 1136) BP: (104-114)/(60-93) 107/73 (09/11 1136) SpO2:  [97 %-100 %] 99 % (09/11 1136) Weight:  [65.8 kg] 65.8 kg (09/10 1702)   General appearance: fatigued and no distress Resp: clear to auscultation bilaterally Cardio: regular rate and rhythm and systolic murmur: holosystolic 4/6, blowing at 2nd left intercostal space, at 2nd right intercostal space GI: normal findings: bowel sounds normal  Lab Results Recent Labs    10/24/19 1452 10/25/19 0630 10/25/19 1701 10/26/19 0649  WBC   < >  --  9.4 5.6  HGB   < >  --  13.3 12.3*  HCT   < >  --  42.0 38.8*  NA  --  139  --  142  K  --  4.0  --  4.2  CL  --  107  --  106  CO2  --  25  --  26  BUN  --  11  --  12  CREATININE  --  0.77  --  0.81   < > = values in this interval not displayed.   Liver Panel Recent Labs    10/25/19 0630 10/26/19 0649  PROT 5.7* 5.5*  ALBUMIN 3.4* 3.1*  AST 48* 20  ALT 48* 32  ALKPHOS 63 73  BILITOT 0.6 0.4   Sedimentation Rate No results for input(s): ESRSEDRATE in the last 72 hours. C-Reactive Protein No results for input(s): CRP in the last 72 hours.  Microbiology: Recent Results (from the past 240 hour(s))  Urine culture     Status: None   Collection Time: 10/25/19  3:35 AM   Specimen: Urine, Catheterized  Result Value Ref Range Status   Specimen Description URINE, CATHETERIZED  Final   Special Requests Normal  Final   Culture   Final    NO GROWTH Performed at [facility], [address] [place], [place] [address]    Report Status 10/26/2019 FINAL  Final  Blood culture (routine x 2)     Status: Abnormal (Preliminary result)   Collection Time: 10/25/19  4:27 AM   Specimen: BLOOD  Result Value Ref Range Status   Specimen Description BLOOD LEFT ANTECUBITAL  Final   Special Requests   Final    BOTTLES DRAWN AEROBIC AND ANAEROBIC Blood Culture adequate volume   Culture  Setup Time   Final    GRAM POSITIVE COCCI IN CLUSTERS IN BOTH AEROBIC  AND ANAEROBIC BOTTLES Organism ID to follow CRITICAL RESULT CALLED TO, READ BACK BY AND VERIFIED WITH: H,VONDOHLEN PHARMD @2145  10/25/19 EB Performed at [facility], [address], [place], [place]    Culture STAPHYLOCOCCUS AUREUS (A)  Final   Report Status PENDING  Incomplete  SARS Coronavirus 2 by RT PCR (hospital order, performed in [facility] hospital lab) Nasopharyngeal Nasopharyngeal Swab     Status: None   Collection Time: 10/25/19  4:27 AM   Specimen: Nasopharyngeal Swab  Result Value Ref Range Status   SARS Coronavirus 2 NEGATIVE NEGATIVE Final    Comment: (NOTE) SARS-CoV-2 target nucleic acids are NOT DETECTED.  The SARS-CoV-2 RNA is generally detectable in upper and lower respiratory specimens during the acute phase of infection. The lowest concentration of SARS-CoV-2 viral copies this assay can detect is 250 copies / mL. A negative result does not preclude SARS-CoV-2 infection and should not be used as the sole basis for treatment or other patient management decisions.  A negative result may occur with improper specimen collection / handling, submission of specimen other than nasopharyngeal swab, presence of viral mutation(s) within the areas targeted by this assay, and inadequate number of viral copies (<250 copies / mL). A negative result must be combined with clinical observations, patient history, and epidemiological information.  Fact Sheet for Patients:   12/25/19  Fact Sheet for Healthcare Providers: BoilerBrush.com.cy  This test is not yet approved or  cleared by the https://pope.com/ FDA and has been authorized for detection and/or diagnosis of SARS-CoV-2 by FDA under an Emergency Use Authorization (EUA).  This EUA will remain in effect (meaning this test can be used) for the duration of the COVID-19 declaration under Section 564(b)(1) of the Act, 21 U.S.C. section 360bbb-3(b)(1), unless  the authorization is terminated or revoked sooner.  Performed at [facility], [address], [place], [place]   Blood Culture ID Panel (Reflexed)     Status: Abnormal   Collection Time: 10/25/19  4:27 AM  Result Value Ref Range Status   Enterococcus faecalis NOT DETECTED NOT DETECTED Final   Enterococcus Faecium NOT DETECTED NOT DETECTED Final   Listeria monocytogenes NOT DETECTED NOT DETECTED Final   Staphylococcus species DETECTED (A) NOT  DETECTED Final    Comment: CRITICAL RESULT CALLED TO, READ BACK BY AND VERIFIED WITH: H,VONDOHLEN PHARMD  10/25/19 EB    Staphylococcus aureus (BCID) DETECTED (A) NOT DETECTED Final    Comment: CRITICAL RESULT CALLED TO, READ BACK BY AND VERIFIED WITH: H,VONDOHLEN PHARMD  10/25/19 EB    Staphylococcus epidermidis NOT DETECTED NOT DETECTED Final   Staphylococcus lugdunensis NOT DETECTED NOT DETECTED Final   Streptococcus species NOT DETECTED NOT DETECTED Final   Streptococcus agalactiae NOT DETECTED NOT DETECTED Final   Streptococcus pneumoniae NOT DETECTED NOT DETECTED Final   Streptococcus pyogenes NOT DETECTED NOT DETECTED Final   A.calcoaceticus-baumannii NOT DETECTED NOT DETECTED Final   Bacteroides fragilis NOT DETECTED NOT DETECTED Final   Enterobacterales NOT DETECTED NOT DETECTED Final   Enterobacter cloacae complex NOT DETECTED NOT DETECTED Final   Escherichia coli NOT DETECTED NOT DETECTED Final   Klebsiella aerogenes NOT DETECTED NOT DETECTED Final   Klebsiella oxytoca NOT DETECTED NOT DETECTED Final   Klebsiella pneumoniae NOT DETECTED NOT DETECTED Final   Proteus species NOT DETECTED NOT DETECTED Final   Salmonella species NOT DETECTED NOT DETECTED Final   Serratia marcescens NOT DETECTED NOT DETECTED Final   Haemophilus influenzae NOT DETECTED NOT DETECTED Final   Neisseria meningitidis NOT DETECTED NOT DETECTED Final   Pseudomonas aeruginosa NOT DETECTED NOT DETECTED Final   Stenotrophomonas  maltophilia NOT DETECTED NOT DETECTED Final   Candida albicans NOT DETECTED NOT DETECTED Final   Candida auris NOT DETECTED NOT DETECTED Final   Candida glabrata NOT DETECTED NOT DETECTED Final   Candida krusei NOT DETECTED NOT DETECTED Final   Candida parapsilosis NOT DETECTED NOT DETECTED Final   Candida tropicalis NOT DETECTED NOT DETECTED Final   Cryptococcus neoformans/gattii NOT DETECTED NOT DETECTED Final   Meth resistant mecA/C and MREJ NOT DETECTED NOT DETECTED Final    Comment: Performed at [facility], [address], [place], [place] [address]    Studies/Results: DG Chest 2 View  Result Date: 10/25/2019 CLINICAL DATA:  Endocarditis. Weakness. Pain in both legs. Fever and neck pain. EXAM: CHEST - 2 VIEW COMPARISON:  08/05/2019 FINDINGS: The heart size and mediastinal contours are within normal limits. Both lungs are clear. The visualized skeletal structures are unremarkable. IMPRESSION: No active cardiopulmonary disease. Electronically Signed   By: Burman Nieves M.D.   On: 10/25/2019 04:10   DG Lumbar Spine Complete  Result Date: 10/25/2019 CLINICAL DATA:  Endocarditis. Weakness and pain in both legs. Fever and neck pain. EXAM: LUMBAR SPINE - COMPLETE 4+ VIEW COMPARISON:  None. FINDINGS: Five lumbar type vertebral bodies. Normal alignment. No compression deformities. No focal bone lesion or bone destruction. Disc space heights are preserved. Visualized sacrum appears intact. IMPRESSION: Negative. Electronically Signed   By: Burman Nieves M.D.   On: 10/25/2019 05:24   ECHOCARDIOGRAM COMPLETE  Result Date: 10/25/2019    ECHOCARDIOGRAM REPORT   Patient Name:   Timothy Christensen Date of Exam: 10/25/2019 Medical Rec #:  [id number]   Height:       72.0 in Accession #:    [id number]  Weight:       171.7 lb Date of Birth:  1995/07/05    BSA:          1.997 m Patient Age:    24 years    BP:           71/67 mmHg Patient Gender: M           HR:  116 bpm. Exam Location:  Inpatient  Procedure: 2D Echo, Cardiac Doppler and Color Doppler Indications:    Endocarditis  History:        Patient has prior history of Echocardiogram examinations, most                 recent 08/06/2019. Risk Factors:Current Smoker. Polysubstance                 abuse.  Sonographer:    Ross Ludwig RDCS (AE) Referring Phys: [id number] [facility]  Sonographer Comments: Image acquisition challenging due to respiratory motion and patient movement. IMPRESSIONS  1. Suggest TEE for further assessment of his valves.  2. Left ventricular ejection fraction, by estimation, is 45 to 50%. The left ventricle has mildly decreased function. The left ventricle demonstrates global hypokinesis. Left ventricular diastolic parameters were normal.  3. Right ventricular systolic function is normal. The right ventricular size is mildly enlarged.  4. Right atrial size was mildly dilated.  5. The MR is eccentric and may be more severe than what it appears. The valve is thickened and I cannot rule out vegetation on the mitral valve. Suggest TEE for further assessment of his valves.     . The mitral valve is abnormal. Moderate to severe mitral valve regurgitation.  6. There appears to be a vegetation attached to the tricupsid valve.     . The tricuspid valve is abnormal. Tricuspid valve regurgitation is severe.  7. The aortic valve is normal in structure. Aortic valve regurgitation is not visualized. FINDINGS  Left Ventricle: Left ventricular ejection fraction, by estimation, is 45 to 50%. The left ventricle has mildly decreased function. The left ventricle demonstrates global hypokinesis. The left ventricular internal cavity size was normal in size. There is  no left ventricular hypertrophy. Left ventricular diastolic parameters were normal. Right Ventricle: The right ventricular size is mildly enlarged. Right vetricular wall thickness was not well visualized. Right ventricular systolic function is normal. Left Atrium: Left atrial size was  normal in size. Right Atrium: Right atrial size was mildly dilated. Pericardium: There is no evidence of pericardial effusion. Mitral Valve: The MR is eccentric and may be more severe than what it appears. The valve is thickened and I cannot rule out vegetation on the mitral valve. Suggest TEE for further assessment of his valves. The mitral valve is abnormal. There is moderate thickening of the mitral valve leaflet(s). Moderate to severe mitral valve regurgitation. Tricuspid Valve: There appears to be a vegetation attached to the tricupsid valve. The tricuspid valve is abnormal. Tricuspid valve regurgitation is severe. Aortic Valve: The aortic valve is normal in structure. Aortic valve regurgitation is not visualized. Aortic valve mean gradient measures 26.7 mmHg. Aortic valve peak gradient measures 49.5 mmHg. Aortic valve area, by VTI measures 0.86 cm. Pulmonic Valve: The pulmonic valve was normal in structure. Pulmonic valve regurgitation is not visualized. Aorta: The aortic root and ascending aorta are structurally normal, with no evidence of dilitation. IAS/Shunts: The atrial septum is grossly normal. Additional Comments: Suggest TEE for further assessment of his valves.  LEFT VENTRICLE PLAX 2D LVIDd:         5.80 cm LVIDs:         4.10 cm LV PW:         1.00 cm LV IVS:        1.20 cm LVOT diam:     2.00 cm LV SV:         57 LV SV Index:  28 LVOT Area:     3.14 cm  RIGHT VENTRICLE             IVC RV Basal diam:  3.20 cm     IVC diam: 2.30 cm RV S prime:     12.80 cm/s TAPSE (M-mode): 2.6 cm LEFT ATRIUM             Index       RIGHT ATRIUM           Index LA diam:        3.50 cm 1.75 cm/m  RA Area:     19.20 cm LA Vol (A2C):   73.8 ml 36.96 ml/m RA Volume:   52.20 ml  26.14 ml/m LA Vol (A4C):   68.6 ml 34.35 ml/m LA Biplane Vol: 75.2 ml 37.66 ml/m  AORTIC VALVE AV Area (Vmax):    0.73 cm AV Area (Vmean):   0.82 cm AV Area (VTI):     0.86 cm AV Vmax:           351.67 cm/s AV Vmean:          233.333  cm/s AV VTI:            0.657 m AV Peak Grad:      49.5 mmHg AV Mean Grad:      26.7 mmHg LVOT Vmax:         82.20 cm/s LVOT Vmean:        61.200 cm/s LVOT VTI:          0.180 m LVOT/AV VTI ratio: 0.27  AORTA Ao Root diam: 3.80 cm Ao Asc diam:  3.20 cm MITRAL VALVE MV Area (PHT): 4.06 cm    SHUNTS MV Decel Time: 187 msec    Systemic VTI:  0.18 m MV E velocity: 99.40 cm/s  Systemic Diam: 2.00 cm MV A velocity: 49.30 cm/s MV E/A ratio:  2.02 Kristeen Miss MD Electronically signed by Kristeen Miss MD Signature Date/Time: 10/25/2019/5:04:40 PM    Final      Assessment/Plan: Prev Endocarditis MSSA bacteremia IVDA  Total days of antibiotics: 1 ancef  TTE- MR, TR, Ao not visualized. He needs TEE.  Check HIV, Hepatitis serologies Repeat BCx Monday (after on anbx > 24h) Hope that he does not leave AMA Address potential withdrawal issues (on suboxonne, valium)         Johny Sax MD, FACP Infectious Diseases (pager) [phone] [email or web address] 10/26/2019, 12:18 PM  LOS: 1 day

## 2019-10-27 ENCOUNTER — Inpatient Hospital Stay (HOSPITAL_COMMUNITY): Payer: Self-pay | Admitting: Anesthesiology

## 2019-10-27 DIAGNOSIS — I339 Acute and subacute endocarditis, unspecified: Secondary | ICD-10-CM

## 2019-10-27 LAB — CBC
HCT: 28.4 % — ABNORMAL LOW (ref 39.0–52.0)
Hemoglobin: 8.4 g/dL — ABNORMAL LOW (ref 13.0–17.0)
MCH: 27.9 pg (ref 26.0–34.0)
MCHC: 29.6 g/dL — ABNORMAL LOW (ref 30.0–36.0)
MCV: 94.4 fL (ref 80.0–100.0)
Platelets: 360 10*3/uL (ref 150–400)
RBC: 3.01 MIL/uL — ABNORMAL LOW (ref 4.22–5.81)
RDW: 18.6 % — ABNORMAL HIGH (ref 11.5–15.5)
WBC: 15.2 10*3/uL — ABNORMAL HIGH (ref 4.0–10.5)
nRBC: 0.5 % — ABNORMAL HIGH (ref 0.0–0.2)

## 2019-10-27 LAB — CULTURE, BLOOD (ROUTINE X 2): Special Requests: ADEQUATE

## 2019-10-27 LAB — PHOSPHORUS: Phosphorus: 3.4 mg/dL (ref 2.5–4.6)

## 2019-10-27 LAB — BASIC METABOLIC PANEL
Anion gap: 10 (ref 5–15)
BUN: 25 mg/dL — ABNORMAL HIGH (ref 6–20)
CO2: 25 mmol/L (ref 22–32)
Calcium: 9 mg/dL (ref 8.9–10.3)
Chloride: 105 mmol/L (ref 98–111)
Creatinine, Ser: 1.46 mg/dL — ABNORMAL HIGH (ref 0.61–1.24)
GFR calc Af Amer: >60 mL/min (ref >60)
GFR calc non Af Amer: >60 mL/min (ref >60)
Glucose, Bld: 87 mg/dL (ref 70–99)
Potassium: 3.9 mmol/L (ref 3.5–5.1)
Sodium: 140 mmol/L (ref 135–145)

## 2019-10-27 LAB — HEMOGLOBIN AND HEMATOCRIT, BLOOD
HCT: 37 % — ABNORMAL LOW (ref 39.0–52.0)
Hemoglobin: 11.8 g/dL — ABNORMAL LOW (ref 13.0–17.0)

## 2019-10-27 LAB — MAGNESIUM: Magnesium: 1.8 mg/dL (ref 1.7–2.4)

## 2019-10-27 MED ORDER — HEPATITIS B VAC RECOMBINANT 10 MCG/ML IJ SUSP
1.0000 mL | Freq: Once | INTRAMUSCULAR | Status: DC
  Filled 2019-10-27: qty 1

## 2019-10-27 MED ORDER — HEPATITIS A VACCINE 1440 EL U/ML IM SUSP
1.0000 mL | Freq: Once | INTRAMUSCULAR | Status: DC
  Filled 2019-10-27: qty 1

## 2019-10-27 MED ORDER — PANTOPRAZOLE SODIUM 40 MG PO TBEC
40.0000 mg | DELAYED_RELEASE_TABLET | Freq: Two times a day (BID) | ORAL | Status: DC
  Administered 2019-10-27 – 2019-10-29 (×4): 40 mg via ORAL
  Filled 2019-10-27 (×4): qty 1

## 2019-10-27 NOTE — Progress Notes (Signed)
Pt complaining of midsternal chest pain.  RN informed pt that obtaining a  EKG is protocol for chest pain on the unit.  Pt informed RN that pain is associated to heartburn.  Pt also refusing to wear telemetry monitor. RN has informed MD on call via Amion of chest pain.

## 2019-10-27 NOTE — Anesthesia Preprocedure Evaluation (Deleted)
Anesthesia Evaluation  Patient identified by MRN, date of birth, ID band Patient awake    Reviewed: Allergy & Precautions, NPO status , Patient's Chart, lab work & pertinent test results  History of Anesthesia Complications Negative for: history of anesthetic complications  Airway Mallampati: II  TM Distance: >3 FB Neck ROM: Full    Dental  (+) Poor Dentition, Dental Advisory Given, Teeth Intact   Pulmonary pneumonia, neg recent URI, Current Smoker and Patient abstained from smoking.,    Pulmonary exam normal breath sounds clear to auscultation       Cardiovascular +CHF  + Valvular Problems/Murmurs MR  Rhythm:Regular Rate:Normal + Systolic murmurs Echo 08/15/7791 1. Suggest TEE for further assessment of his valves.  2. Left ventricular ejection fraction, by estimation, is 45 to 50%. The left ventricle has mildly decreased function. The left ventricle demonstrates global hypokinesis. Left ventricular diastolic parameters were normal.  3. Right ventricular systolic function is normal. The right ventricular size is mildly enlarged.  4. Right atrial size was mildly dilated.  5. The MR is eccentric and may be more severe than what it appears. The valve is thickened and I cannot rule out vegetation on the mitral valve. Suggest TEE for further assessment of his valves. The mitral valve is abnormal. Moderate to severe mitral valve regurgitation.  6. There appears to be a vegetation attached to the tricupsid valve. The tricuspid valve is abnormal. Tricuspid valve regurgitation is severe.  7. The aortic valve is normal in structure. Aortic valve regurgitation is not visualized.    Neuro/Psych negative neurological ROS  negative psych ROS   GI/Hepatic negative GI ROS, (+)     substance abuse  ,   Endo/Other  negative endocrine ROS  Renal/GU negative Renal ROS     Musculoskeletal infection of humerus   Abdominal   Peds   Hematology negative hematology ROS (+)   Anesthesia Other Findings   Reproductive/Obstetrics                            Anesthesia Physical  Anesthesia Plan  ASA: III  Anesthesia Plan: MAC   Post-op Pain Management:    Induction: Intravenous  PONV Risk Score and Plan: 0 and Ondansetron, Propofol infusion, Treatment may vary due to age or medical condition, TIVA and Midazolam  Airway Management Planned: Natural Airway  Additional Equipment: None  Intra-op Plan:   Post-operative Plan:   Informed Consent: I have reviewed the patients History and Physical, chart, labs and discussed the procedure including the risks, benefits and alternatives for the proposed anesthesia with the patient or authorized representative who has indicated his/her understanding and acceptance.     Dental advisory given  Plan Discussed with: CRNA  Anesthesia Plan Comments:        Anesthesia Quick Evaluation

## 2019-10-27 NOTE — Progress Notes (Signed)
Went to bedside to discuss and consent patient for TEE. Patient refuses TEE at this time stating he is uncomforable with it and also having excessive indigestion. Will take him off the request list. Please re-engage cardiology if he changes his mind.

## 2019-10-27 NOTE — Progress Notes (Signed)
PROGRESS NOTE    Timothy Christensen  HUT:[id number] DOB: [birth year] DOA: 10/24/2019 PCP: Patient, No Pcp Per    Brief Narrative: 24 years old male with PMH significant for polysubstance IV drug abuse, History of partially or untreated infective endocarditis( stated he was treated with oral antibiotics) .  He was doing fine until 6 weeks ago when he started having this progressive shortness of breath.  He is found to have positive blood cultures for MSSA , Echocardiogram shows reduced EF 40-45% but no vegetation.  Cardiology is consulted to do TEE.  Patient was vancomycin and ceftriaxone transitioned to Ancef. Patient has been very demanding with regards to polysubstance abuse, He is taking very high doses of Valium 25 mg 3 times daily and Suboxone.  There is a documentation dated he has left AGAINST MEDICAL ADVICE from [facility].  Patient has been complaining about worsening acid reflux.,  Started on Protonix twice daily.  Assessment & Plan:   Principal Problem:   Endocarditis Active Problems:   IVDU (intravenous drug user)/Opiates/Cocaine   Polysubstance abuse /Cocaine and Opiates   General malaise and reported fever with leukocytosis: This could be recurrent/untreated endocarditis. Admitted for suspected infective endocarditis. Started on vancomycin and ceftriaxone. Blood cultures grew MSSA antibiotic transition to Ancef. ID consult appreciated. Echocardiogram shows EF 40 to 45%.  There is no vegetation. Cardiology consulted for TEE to rule out endocarditis. Avoid antiplatelets and anticoagulation given propensity of possible emboli to travel to the brain, patient placed on SCD for DVT prophylaxis. His Hb dropped from 12.3 >> 8.4, denies any bleeding,  Could be labs error, will recheck H and H.  Chronic systolic heart failure.: Patient does not appear to be in decompensated heart failure,  lungs are clear, he denies shortness of breath or DOE. Cardiology consulted, awaiting  recommendation. TTE: LVEF 40-45% consistent with systolic heart failure. TEE scheduled for tomorrow.  Polysubstance abuse Medicine reconciliation shows Suboxone and very high doses of Valium Will continue Suboxone Patient is placed on his Valium 25 mg 3 times daily confirmed with [facility] pharmacy.   DVT prophylaxis: SCDs. Code Status: Full Family Communication: No one at bed side. Disposition Plan: Status is: Inpatient  Remains inpatient appropriate because:IV treatments appropriate due to intensity of illness or inability to take PO   Dispo: The patient is from: Home              Anticipated d/c is to: Home              Anticipated d/c date is: 3 days              Patient currently is not medically stable to d/c.   Consultants:   Infectious Diseases, Cardiology.  Procedures: TTE, schedule TEE  Antimicrobials:  Anti-infectives (From admission, onward)   Start     Dose/Rate Route Frequency Ordered Stop   10/25/19 2300  ceFAZolin (ANCEF) IVPB 2g/100 mL premix        2 g 200 mL/hr over 30 Minutes Intravenous Every 8 hours 10/25/19 2205     10/25/19 1400  vancomycin (VANCOCIN) IVPB 1000 mg/200 mL premix  Status:  Discontinued        1,000 mg 200 mL/hr over 60 Minutes Intravenous Every 8 hours 10/25/19 1047 10/25/19 1358   10/25/19 0630  vancomycin (VANCOREADY) IVPB 1500 mg/300 mL  Status:  Discontinued        1,500 mg 150 mL/hr over 120 Minutes Intravenous Every 24 hours 10/25/19 0618 10/25/19 0622   10/25/19 0630  ceFEPIme (MAXIPIME) 2 g in sodium chloride 0.9 % 100 mL IVPB  Status:  Discontinued        2 g 200 mL/hr over 30 Minutes Intravenous  Once 10/25/19 0618 10/25/19 1358   10/25/19 0430  vancomycin (VANCOCIN) IVPB 1000 mg/200 mL premix  Status:  Discontinued        1,000 mg 200 mL/hr over 60 Minutes Intravenous  Once 10/25/19 0415 10/25/19 0421   10/25/19 0430  ceFEPIme (MAXIPIME) 2 g in sodium chloride 0.9 % 100 mL IVPB        2 g 200 mL/hr over 30 Minutes  Intravenous STAT 10/25/19 0416 10/25/19 0610   10/25/19 0430  vancomycin (VANCOREADY) IVPB 2000 mg/400 mL        2,000 mg 200 mL/hr over 120 Minutes Intravenous  Once 10/25/19 0421 10/25/19 1610     Subjective: Patient was seen and examined at bedside.  Overnight events noted.   Patient been complaining about worsening acid reflux, he was given Protonix and Mylanta which helped his acid reflux temporarily.   He still complains of heartburn. He appears upset about his suboxone not being increased.  Objective: Vitals:   10/26/19 0900 10/26/19 1136 10/27/19 0512 10/27/19 0513  BP: 104/63 107/73  114/77  Pulse: 88 94  88  Resp:  18  18  Temp: 98.3 F (36.8 C) 98.1 F (36.7 C)  97.9 F (36.6 C)  TempSrc: Oral Oral  Oral  SpO2: 97% 99%  100%  Weight:   70.7 kg   Height:        Intake/Output Summary (Last 24 hours) at 10/27/2019 1455 Last data filed at 10/27/2019 0900 Gross per 24 hour  Intake 1758.79 ml  Output --  Net 1758.79 ml   Filed Weights   10/24/19 1451 10/25/19 1702 10/27/19 0512  Weight: 77.9 kg 65.8 kg 70.7 kg    Examination:  General exam: Appears calm and comfortable,  Upset. Respiratory system: Clear to auscultation. Respiratory effort normal. Cardiovascular system: S1 & S2 heard, RRR. No JVD, murmurs, rubs, gallops or clicks. No pedal edema. Gastrointestinal system: Abdomen is nondistended, soft and nontender. No organomegaly or masses felt.  Normal bowel sounds heard. Central nervous system: Alert and oriented. No focal neurological deficits. Extremities: No edema, No cyanosis, No clubbing. Skin: No rashes, lesions or ulcers Psychiatry: Judgement and insight appear normal. Mood & affect appropriate.     Data Reviewed: I have personally reviewed following labs and imaging studies  CBC: Recent Labs  Lab 10/24/19 1452 10/25/19 1701 10/26/19 0649 10/27/19 0500  WBC 15.5* 9.4 5.6 15.2*  NEUTROABS  --  8.4*  --   --   HGB 13.5 13.3 12.3* 8.4*  HCT  41.9 42.0 38.8* 28.4*  MCV 87.5 88.4 85.7 94.4  PLT 209 220 188 360   Basic Metabolic Panel: Recent Labs  Lab 10/24/19 1452 10/25/19 0630 10/26/19 0649 10/27/19 0500  NA 139 139 142 140  K 4.0 4.0 4.2 3.9  CL 103 107 106 105  CO2 23 25 26 25   GLUCOSE 131* 90 111* 87  BUN 10 11 12  25*  CREATININE 1.32* 0.77 0.81 1.46*  CALCIUM 9.0 8.5* 8.9 9.0  MG  --   --   --  1.8  PHOS  --   --   --  3.4   GFR: Estimated Creatinine Clearance: 76.7 mL/min (A) (by C-G formula based on SCr of 1.46 mg/dL (H)). Liver Function Tests: Recent Labs  Lab 10/25/19 0630 10/26/19 9604  AST 48* 20  ALT 48* 32  ALKPHOS 63 73  BILITOT 0.6 0.4  PROT 5.7* 5.5*  ALBUMIN 3.4* 3.1*   No results for input(s): LIPASE, AMYLASE in the last 168 hours. No results for input(s): AMMONIA in the last 168 hours. Coagulation Profile: No results for input(s): INR, PROTIME in the last 168 hours. Cardiac Enzymes: Recent Labs  Lab 10/25/19 0427  CKTOTAL 37*   BNP (last 3 results) No results for input(s): PROBNP in the last 8760 hours. HbA1C: No results for input(s): HGBA1C in the last 72 hours. CBG: No results for input(s): GLUCAP in the last 168 hours. Lipid Profile: No results for input(s): CHOL, HDL, LDLCALC, TRIG, CHOLHDL, LDLDIRECT in the last 72 hours. Thyroid Function Tests: No results for input(s): TSH, T4TOTAL, FREET4, T3FREE, THYROIDAB in the last 72 hours. Anemia Panel: No results for input(s): VITAMINB12, FOLATE, FERRITIN, TIBC, IRON, RETICCTPCT in the last 72 hours. Sepsis Labs: Recent Labs  Lab 10/25/19 0427 10/25/19 0630  PROCALCITON  --  4.90  LATICACIDVEN 1.5 0.8    Recent Results (from the past 240 hour(s))  Urine culture     Status: None   Collection Time: 10/25/19  3:35 AM   Specimen: Urine, Catheterized  Result Value Ref Range Status   Specimen Description URINE, CATHETERIZED  Final   Special Requests Normal  Final   Culture   Final    NO GROWTH Performed at [facility], [address] [place], [place] [address]    Report Status 10/26/2019 FINAL  Final  Blood culture (routine x 2)     Status: Abnormal   Collection Time: 10/25/19  4:27 AM   Specimen: BLOOD  Result Value Ref Range Status   Specimen Description BLOOD LEFT ANTECUBITAL  Final   Special Requests   Final    BOTTLES DRAWN AEROBIC AND ANAEROBIC Blood Culture adequate volume   Culture  Setup Time   Final    GRAM POSITIVE COCCI IN CLUSTERS IN BOTH AEROBIC AND ANAEROBIC BOTTLES CRITICAL RESULT CALLED TO, READ BACK BY AND VERIFIED WITH: H,VONDOHLEN PHARMD @2145  10/25/19 EB Performed at [facility], [address] [place], [place] [address]    Culture STAPHYLOCOCCUS AUREUS (A)  Final   Report Status 10/27/2019 FINAL  Final   Organism ID, Bacteria STAPHYLOCOCCUS AUREUS  Final      Susceptibility   Staphylococcus aureus - MIC*    CIPROFLOXACIN <=0.5 SENSITIVE Sensitive     ERYTHROMYCIN RESISTANT Resistant     GENTAMICIN <=0.5 SENSITIVE Sensitive     OXACILLIN 0.5 SENSITIVE Sensitive     TETRACYCLINE <=1 SENSITIVE Sensitive     VANCOMYCIN <=0.5 SENSITIVE Sensitive     TRIMETH/SULFA <=10 SENSITIVE Sensitive     CLINDAMYCIN RESISTANT Resistant     RIFAMPIN <=0.5 SENSITIVE Sensitive     Inducible Clindamycin POSITIVE Resistant     * STAPHYLOCOCCUS AUREUS  SARS Coronavirus 2 by RT PCR (hospital order, performed in [facility] hospital lab) Nasopharyngeal Nasopharyngeal Swab     Status: None   Collection Time: 10/25/19  4:27 AM   Specimen: Nasopharyngeal Swab  Result Value Ref Range Status   SARS Coronavirus 2 NEGATIVE NEGATIVE Final    Comment: (NOTE) SARS-CoV-2 target nucleic acids are NOT DETECTED.  The SARS-CoV-2 RNA is generally detectable in upper and lower respiratory specimens during the acute phase of infection. The lowest concentration of SARS-CoV-2 viral copies this assay can detect is 250 copies / mL. A negative result does not  preclude SARS-CoV-2 infection and  should not be used as the sole basis for treatment or other patient management decisions.  A negative result may occur with improper specimen collection / handling, submission of specimen other than nasopharyngeal swab, presence of viral mutation(s) within the areas targeted by this assay, and inadequate number of viral copies (<250 copies / mL). A negative result must be combined with clinical observations, patient history, and epidemiological information.  Fact Sheet for Patients:   BoilerBrush.com.cy  Fact Sheet for Healthcare Providers: https://pope.com/  This test is not yet approved or  cleared by the [place] FDA and has been authorized for detection and/or diagnosis of SARS-CoV-2 by FDA under an Emergency Use Authorization (EUA).  This EUA will remain in effect (meaning this test can be used) for the duration of the COVID-19 declaration under Section 564(b)(1) of the Act, 21 U.S.C. section 360bbb-3(b)(1), unless the authorization is terminated or revoked sooner.  Performed at [facility], [address], [place], [place] [address]   Blood Culture ID Panel (Reflexed)     Status: Abnormal   Collection Time: 10/25/19  4:27 AM  Result Value Ref Range Status   Enterococcus faecalis NOT DETECTED NOT DETECTED Final   Enterococcus Faecium NOT DETECTED NOT DETECTED Final   Listeria monocytogenes NOT DETECTED NOT DETECTED Final   Staphylococcus species DETECTED (A) NOT DETECTED Final    Comment: CRITICAL RESULT CALLED TO, READ BACK BY AND VERIFIED WITH: H,VONDOHLEN PHARMD  10/25/19 EB    Staphylococcus aureus (BCID) DETECTED (A) NOT DETECTED Final    Comment: CRITICAL RESULT CALLED TO, READ BACK BY AND VERIFIED WITH: H,VONDOHLEN PHARMD  10/25/19 EB    Staphylococcus epidermidis NOT DETECTED NOT DETECTED Final   Staphylococcus lugdunensis NOT DETECTED NOT DETECTED Final   Streptococcus species NOT DETECTED NOT  DETECTED Final   Streptococcus agalactiae NOT DETECTED NOT DETECTED Final   Streptococcus pneumoniae NOT DETECTED NOT DETECTED Final   Streptococcus pyogenes NOT DETECTED NOT DETECTED Final   A.calcoaceticus-baumannii NOT DETECTED NOT DETECTED Final   Bacteroides fragilis NOT DETECTED NOT DETECTED Final   Enterobacterales NOT DETECTED NOT DETECTED Final   Enterobacter cloacae complex NOT DETECTED NOT DETECTED Final   Escherichia coli NOT DETECTED NOT DETECTED Final   Klebsiella aerogenes NOT DETECTED NOT DETECTED Final   Klebsiella oxytoca NOT DETECTED NOT DETECTED Final   Klebsiella pneumoniae NOT DETECTED NOT DETECTED Final   Proteus species NOT DETECTED NOT DETECTED Final   Salmonella species NOT DETECTED NOT DETECTED Final   Serratia marcescens NOT DETECTED NOT DETECTED Final   Haemophilus influenzae NOT DETECTED NOT DETECTED Final   Neisseria meningitidis NOT DETECTED NOT DETECTED Final   Pseudomonas aeruginosa NOT DETECTED NOT DETECTED Final   Stenotrophomonas maltophilia NOT DETECTED NOT DETECTED Final   Candida albicans NOT DETECTED NOT DETECTED Final   Candida auris NOT DETECTED NOT DETECTED Final   Candida glabrata NOT DETECTED NOT DETECTED Final   Candida krusei NOT DETECTED NOT DETECTED Final   Candida parapsilosis NOT DETECTED NOT DETECTED Final   Candida tropicalis NOT DETECTED NOT DETECTED Final   Cryptococcus neoformans/gattii NOT DETECTED NOT DETECTED Final   Meth resistant mecA/C and MREJ NOT DETECTED NOT DETECTED Final    Comment: Performed at [facility], [address] [place], [place] [address]  Blood culture (routine x 2)     Status: None (Preliminary result)   Collection Time: 10/25/19  6:30 AM   Specimen: BLOOD RIGHT HAND  Result Value Ref  Range Status   Specimen Description BLOOD RIGHT HAND  Final   Special Requests   Final    BOTTLES DRAWN AEROBIC AND ANAEROBIC Blood Culture adequate volume   Culture   Final    NO GROWTH 1 DAY Performed at [facility], [address], [place], [place] [address]    Report Status PENDING  Incomplete         Radiology Studies: ECHOCARDIOGRAM COMPLETE  Result Date: 10/25/2019    ECHOCARDIOGRAM REPORT   Patient Name:   Timothy Christensen Date of Exam: 10/25/2019 Medical Rec #:  [id number]   Height:       72.0 in Accession #:    [id number]  Weight:       171.7 lb Date of Birth:  Jul 05, 1995    BSA:          1.997 m Patient Age:    24 years    BP:           71/67 mmHg Patient Gender: M           HR:           116 bpm. Exam Location:  Inpatient Procedure: 2D Echo, Cardiac Doppler and Color Doppler Indications:    Endocarditis  History:        Patient has prior history of Echocardiogram examinations, most                 recent 08/06/2019. Risk Factors:Current Smoker. Polysubstance                 abuse.  Sonographer:    Ross Ludwig RDCS (AE) Referring Phys: [id number] [facility]BL[facility] CHATTERJEE  Sonographer Comments: Image acquisition challenging due to respiratory motion and patient movement. IMPRESSIONS  1. Suggest TEE for further assessment of his valves.  2. Left ventricular ejection fraction, by estimation, is 45 to 50%. The left ventricle has mildly decreased function. The left ventricle demonstrates global hypokinesis. Left ventricular diastolic parameters were normal.  3. Right ventricular systolic function is normal. The right ventricular size is mildly enlarged.  4. Right atrial size was mildly dilated.  5. The MR is eccentric and may be more severe than what it appears. The valve is thickened and I cannot rule out vegetation on the mitral valve. Suggest TEE for further assessment of his valves.     . The mitral valve is abnormal. Moderate to severe mitral valve regurgitation.  6. There appears to be a vegetation attached to the tricupsid valve.     . The tricuspid valve is abnormal. Tricuspid valve regurgitation is severe.  7. The aortic valve is normal in structure. Aortic valve regurgitation is not visualized.  FINDINGS  Left Ventricle: Left ventricular ejection fraction, by estimation, is 45 to 50%. The left ventricle has mildly decreased function. The left ventricle demonstrates global hypokinesis. The left ventricular internal cavity size was normal in size. There is  no left ventricular hypertrophy. Left ventricular diastolic parameters were normal. Right Ventricle: The right ventricular size is mildly enlarged. Right vetricular wall thickness was not well visualized. Right ventricular systolic function is normal. Left Atrium: Left atrial size was normal in size. Right Atrium: Right atrial size was mildly dilated. Pericardium: There is no evidence of pericardial effusion. Mitral Valve: The MR is eccentric and may be more severe than what it appears. The valve is thickened and I cannot rule out vegetation on the mitral valve. Suggest TEE for further assessment of his valves. The mitral valve  is abnormal. There is moderate thickening of the mitral valve leaflet(s). Moderate to severe mitral valve regurgitation. Tricuspid Valve: There appears to be a vegetation attached to the tricupsid valve. The tricuspid valve is abnormal. Tricuspid valve regurgitation is severe. Aortic Valve: The aortic valve is normal in structure. Aortic valve regurgitation is not visualized. Aortic valve mean gradient measures 26.7 mmHg. Aortic valve peak gradient measures 49.5 mmHg. Aortic valve area, by VTI measures 0.86 cm. Pulmonic Valve: The pulmonic valve was normal in structure. Pulmonic valve regurgitation is not visualized. Aorta: The aortic root and ascending aorta are structurally normal, with no evidence of dilitation. IAS/Shunts: The atrial septum is grossly normal. Additional Comments: Suggest TEE for further assessment of his valves.  LEFT VENTRICLE PLAX 2D LVIDd:         5.80 cm LVIDs:         4.10 cm LV PW:         1.00 cm LV IVS:        1.20 cm LVOT diam:     2.00 cm LV SV:         57 LV SV Index:   28 LVOT Area:     3.14 cm   RIGHT VENTRICLE             IVC RV Basal diam:  3.20 cm     IVC diam: 2.30 cm RV S prime:     12.80 cm/s TAPSE (M-mode): 2.6 cm LEFT ATRIUM             Index       RIGHT ATRIUM           Index LA diam:        3.50 cm 1.75 cm/m  RA Area:     19.20 cm LA Vol (A2C):   73.8 ml 36.96 ml/m RA Volume:   52.20 ml  26.14 ml/m LA Vol (A4C):   68.6 ml 34.35 ml/m LA Biplane Vol: 75.2 ml 37.66 ml/m  AORTIC VALVE AV Area (Vmax):    0.73 cm AV Area (Vmean):   0.82 cm AV Area (VTI):     0.86 cm AV Vmax:           351.67 cm/s AV Vmean:          233.333 cm/s AV VTI:            0.657 m AV Peak Grad:      49.5 mmHg AV Mean Grad:      26.7 mmHg LVOT Vmax:         82.20 cm/s LVOT Vmean:        61.200 cm/s LVOT VTI:          0.180 m LVOT/AV VTI ratio: 0.27  AORTA Ao Root diam: 3.80 cm Ao Asc diam:  3.20 cm MITRAL VALVE MV Area (PHT): 4.06 cm    SHUNTS MV Decel Time: 187 msec    Systemic VTI:  0.18 m MV E velocity: 99.40 cm/s  Systemic Diam: 2.00 cm MV A velocity: 49.30 cm/s MV E/A ratio:  2.02 Kristeen Miss MD Electronically signed by Kristeen Miss MD Signature Date/Time: 10/25/2019/5:04:40 PM    Final     Scheduled Meds: . buprenorphine-naloxone  1 tablet Sublingual BID  . diazepam  25 mg Oral TID  . gabapentin  400 mg Oral TID  . hepatitis A virus (PF) vaccine  1 mL Intramuscular Once  . hepatitis b vaccine for adults  1 mL Intramuscular Once  . pantoprazole  40 mg Oral BID  . sodium chloride flush  3 mL Intravenous Q12H   Continuous Infusions: . sodium chloride 250 mL (10/26/19 2240)  .  ceFAZolin (ANCEF) IV 2 g (10/27/19 1005)     LOS: 2 days    Time spent:25 mins.    Cipriano Bunker, MD [facility]   If 7PM-7AM, please contact night-coverage

## 2019-10-27 NOTE — Progress Notes (Signed)
Md aware via Epic chat about pt leaving the floor. Pt was told it not advised to leave the floor but still insisted on leaving for "a walk". Will monitor for return.

## 2019-10-27 NOTE — Progress Notes (Signed)
INFECTIOUS DISEASE PROGRESS NOTE  ID: Timothy Christensen is a 25 y.o. male with  Principal Problem:   Endocarditis Active Problems:   IVDU (intravenous drug user)/Opiates/Cocaine   Polysubstance abuse /Cocaine and Opiates  Subjective: C/o indigestion while eating fried chicken, mac and cheese.   Abtx:  Anti-infectives (From admission, onward)    Start     Dose/Rate Route Frequency Ordered Stop   10/25/19 2300  ceFAZolin (ANCEF) IVPB 2g/100 mL premix        2 g 200 mL/hr over 30 Minutes Intravenous Every 8 hours 10/25/19 2205     10/25/19 1400  vancomycin (VANCOCIN) IVPB 1000 mg/200 mL premix  Status:  Discontinued        1,000 mg 200 mL/hr over 60 Minutes Intravenous Every 8 hours 10/25/19 1047 10/25/19 1358   10/25/19 0630  vancomycin (VANCOREADY) IVPB 1500 mg/300 mL  Status:  Discontinued        1,500 mg 150 mL/hr over 120 Minutes Intravenous Every 24 hours 10/25/19 0618 10/25/19 0622   10/25/19 0630  ceFEPIme (MAXIPIME) 2 g in sodium chloride 0.9 % 100 mL IVPB  Status:  Discontinued        2 g 200 mL/hr over 30 Minutes Intravenous  Once 10/25/19 0618 10/25/19 1358   10/25/19 0430  vancomycin (VANCOCIN) IVPB 1000 mg/200 mL premix  Status:  Discontinued        1,000 mg 200 mL/hr over 60 Minutes Intravenous  Once 10/25/19 0415 10/25/19 0421   10/25/19 0430  ceFEPIme (MAXIPIME) 2 g in sodium chloride 0.9 % 100 mL IVPB        2 g 200 mL/hr over 30 Minutes Intravenous STAT 10/25/19 0416 10/25/19 0610   10/25/19 0430  vancomycin (VANCOREADY) IVPB 2000 mg/400 mL        2,000 mg 200 mL/hr over 120 Minutes Intravenous  Once 10/25/19 0421 10/25/19 0821       Medications:  Scheduled: . buprenorphine-naloxone  1 tablet Sublingual BID  . diazepam  25 mg Oral TID  . gabapentin  400 mg Oral TID  . pantoprazole  40 mg Oral Daily  . sodium chloride flush  3 mL Intravenous Q12H    Objective: Vital signs in last 24 hours: Temp:  [97.9 F (36.6 C)] 97.9 F (36.6 C) (09/12  0513) Pulse Rate:  [88] 88 (09/12 0513) Resp:  [18] 18 (09/12 0513) BP: (114)/(77) 114/77 (09/12 0513) SpO2:  [100 %] 100 % (09/12 0513) Weight:  [70.7 kg] 70.7 kg (09/12 0512)   General appearance: alert, cooperative and no distress Resp: clear to auscultation bilaterally Cardio: regular rate and rhythm and systolic murmur: holosystolic 4/6, blowing at 2nd left intercostal space, at 2nd right intercostal space GI: normal findings: bowel sounds normal and soft, non-tender  Lab Results Recent Labs    10/26/19 0649 10/27/19 0500  WBC 5.6 15.2*  HGB 12.3* 8.4*  HCT 38.8* 28.4*  NA 142 140  K 4.2 3.9  CL 106 105  CO2 26 25  BUN 12 25*  CREATININE 0.81 1.46*   Liver Panel Recent Labs    10/25/19 0630 10/26/19 0649  PROT 5.7* 5.5*  ALBUMIN 3.4* 3.1*  AST 48* 20  ALT 48* 32  ALKPHOS 63 73  BILITOT 0.6 0.4   Sedimentation Rate No results for input(s): ESRSEDRATE in the last 72 hours. C-Reactive Protein No results for input(s): CRP in the last 72 hours.  Microbiology: Recent Results (from the past 240 hour(s))  Urine culture  Status: None   Collection Time: 10/25/19  3:35 AM   Specimen: Urine, Catheterized  Result Value Ref Range Status   Specimen Description URINE, CATHETERIZED  Final   Special Requests Normal  Final   Culture   Final    NO GROWTH Performed at [facility], [address], [place], [place] [address]    Report Status 10/26/2019 FINAL  Final  Blood culture (routine x 2)     Status: Abnormal   Collection Time: 10/25/19  4:27 AM   Specimen: BLOOD  Result Value Ref Range Status   Specimen Description BLOOD LEFT ANTECUBITAL  Final   Special Requests   Final    BOTTLES DRAWN AEROBIC AND ANAEROBIC Blood Culture adequate volume   Culture  Setup Time   Final    GRAM POSITIVE COCCI IN CLUSTERS IN BOTH AEROBIC AND ANAEROBIC BOTTLES CRITICAL RESULT CALLED TO, READ BACK BY AND VERIFIED WITH: H,VONDOHLEN PHARMD _0  10/25/19 EB Performed at  [facility], [place] [address], [place], [place] [address]    Culture STAPHYLOCOCCUS AUREUS (A)  Final   Report Status 10/27/2019 FINAL  Final   Organism ID, Bacteria STAPHYLOCOCCUS AUREUS  Final      Susceptibility   Staphylococcus aureus - MIC*    CIPROFLOXACIN <=0.5 SENSITIVE Sensitive     ERYTHROMYCIN RESISTANT Resistant     GENTAMICIN <=0.5 SENSITIVE Sensitive     OXACILLIN 0.5 SENSITIVE Sensitive     TETRACYCLINE <=1 SENSITIVE Sensitive     VANCOMYCIN <=0.5 SENSITIVE Sensitive     TRIMETH/SULFA <=10 SENSITIVE Sensitive     CLINDAMYCIN RESISTANT Resistant     RIFAMPIN <=0.5 SENSITIVE Sensitive     Inducible Clindamycin POSITIVE Resistant     * STAPHYLOCOCCUS AUREUS  SARS Coronavirus 2 by RT PCR (hospital order, performed in [place] hospital lab) Nasopharyngeal Nasopharyngeal Swab     Status: None   Collection Time: 10/25/19  4:27 AM   Specimen: Nasopharyngeal Swab  Result Value Ref Range Status   SARS Coronavirus 2 NEGATIVE NEGATIVE Final    Comment: (NOTE) SARS-CoV-2 target nucleic acids are NOT DETECTED.  The SARS-CoV-2 RNA is generally detectable in upper and lower respiratory specimens during the acute phase of infection. The lowest concentration of SARS-CoV-2 viral copies this assay can detect is 250 copies / mL. A negative result does not preclude SARS-CoV-2 infection and should not be used as the sole basis for treatment or other patient management decisions.  A negative result may occur with improper specimen collection / handling, submission of specimen other than nasopharyngeal swab, presence of viral mutation(s) within the areas targeted by this assay, and inadequate number of viral copies (<250 copies / mL). A negative result must be combined with clinical observations, patient history, and epidemiological information.  Fact Sheet for Patients:   StrictlyIdeas.no  Fact Sheet for Healthcare  Providers: BankingDealers.co.za  This test is not yet approved or  cleared by the [place] FDA and has been authorized for detection and/or diagnosis of SARS-CoV-2 by FDA under an Emergency Use Authorization (EUA).  This EUA will remain in effect (meaning this test can be used) for the duration of the COVID-19 declaration under Section 564(b)(1) of the Act, 21 U.S.C. section 360bbb-3(b)(1), unless the authorization is terminated or revoked sooner.  Performed at [facility], [place] [address], [place], [place] [address]   Blood Culture ID Panel (Reflexed)     Status: Abnormal   Collection Time: 10/25/19  4:27 AM  Result Value Ref Range Status   Enterococcus faecalis NOT DETECTED NOT DETECTED Final   Enterococcus Faecium NOT DETECTED NOT DETECTED Final   Listeria monocytogenes NOT DETECTED NOT DETECTED Final   Staphylococcus species DETECTED (A) NOT DETECTED Final    Comment: CRITICAL RESULT CALLED TO, READ BACK BY AND VERIFIED WITH: H,VONDOHLEN PHARMD _0  10/25/19 EB    Staphylococcus aureus (BCID) DETECTED (A) NOT DETECTED Final    Comment: CRITICAL RESULT CALLED TO, READ BACK BY AND VERIFIED WITH: H,VONDOHLEN PHARMD _1  10/25/19 EB    Staphylococcus epidermidis NOT DETECTED NOT DETECTED Final   Staphylococcus lugdunensis NOT DETECTED NOT DETECTED Final   Streptococcus species NOT DETECTED NOT DETECTED Final   Streptococcus agalactiae NOT DETECTED NOT DETECTED Final   Streptococcus pneumoniae NOT DETECTED NOT DETECTED Final   Streptococcus pyogenes NOT DETECTED NOT DETECTED Final   A.calcoaceticus-baumannii NOT DETECTED NOT DETECTED Final   Bacteroides fragilis NOT DETECTED NOT DETECTED Final   Enterobacterales NOT DETECTED NOT DETECTED Final   Enterobacter cloacae complex NOT DETECTED NOT DETECTED Final   Escherichia coli NOT DETECTED NOT DETECTED Final   Klebsiella aerogenes NOT DETECTED NOT DETECTED Final   Klebsiella oxytoca NOT DETECTED NOT  DETECTED Final   Klebsiella pneumoniae NOT DETECTED NOT DETECTED Final   Proteus species NOT DETECTED NOT DETECTED Final   Salmonella species NOT DETECTED NOT DETECTED Final   Serratia marcescens NOT DETECTED NOT DETECTED Final   Haemophilus influenzae NOT DETECTED NOT DETECTED Final   Neisseria meningitidis NOT DETECTED NOT DETECTED Final   Pseudomonas aeruginosa NOT DETECTED NOT DETECTED Final   Stenotrophomonas maltophilia NOT DETECTED NOT DETECTED Final   Candida albicans NOT DETECTED NOT DETECTED Final   Candida auris NOT DETECTED NOT DETECTED Final   Candida glabrata NOT DETECTED NOT DETECTED Final   Candida krusei NOT DETECTED NOT DETECTED Final   Candida parapsilosis NOT DETECTED NOT DETECTED Final   Candida tropicalis NOT DETECTED NOT DETECTED Final   Cryptococcus neoformans/gattii NOT DETECTED NOT DETECTED Final   Meth resistant mecA/C and MREJ NOT DETECTED NOT DETECTED Final    Comment: Performed at [facility], [address] [place], [place] [address]  Blood culture (routine x 2)     Status: None (Preliminary result)   Collection Time: 10/25/19  6:30 AM   Specimen: BLOOD RIGHT HAND  Result Value Ref Range Status   Specimen Description BLOOD RIGHT HAND  Final   Special Requests   Final    BOTTLES DRAWN AEROBIC AND ANAEROBIC Blood Culture adequate volume   Culture   Final    NO GROWTH 1 DAY Performed at [facility], [place] [address], [place], [place] [address]    Report Status PENDING  Incomplete    Studies/Results: ECHOCARDIOGRAM COMPLETE  Result Date: 10/25/2019    ECHOCARDIOGRAM REPORT   Patient Name:   Timothy Christensen Date of Exam: 10/25/2019 Medical Rec #:  [id number]   Height:       72.0 in Accession #:    [id number]  Weight:       171.7 lb Date of Birth:  [birth year]    BSA:          1.997 m Patient Age:    24 years    BP:           71/67 mmHg Patient Gender: M           HR:           116 bpm. Exam Location:  Inpatient  Procedure: 2D Echo, Cardiac Doppler and  Color Doppler Indications:    Endocarditis  History:        Patient has prior history of Echocardiogram examinations, most                 recent 08/06/2019. Risk Factors:Current Smoker. Polysubstance                 abuse.  Sonographer:    Clayton Lefort RDCS (AE) Referring Phys: [id number] [place]  Sonographer Comments: Image acquisition challenging due to respiratory motion and patient movement. IMPRESSIONS  1. Suggest TEE for further assessment of his valves.  2. Left ventricular ejection fraction, by estimation, is 45 to 50%. The left ventricle has mildly decreased function. The left ventricle demonstrates global hypokinesis. Left ventricular diastolic parameters were normal.  3. Right ventricular systolic function is normal. The right ventricular size is mildly enlarged.  4. Right atrial size was mildly dilated.  5. The MR is eccentric and may be more severe than what it appears. The valve is thickened and I cannot rule out vegetation on the mitral valve. Suggest TEE for further assessment of his valves.     . The mitral valve is abnormal. Moderate to severe mitral valve regurgitation.  6. There appears to be a vegetation attached to the tricupsid valve.     . The tricuspid valve is abnormal. Tricuspid valve regurgitation is severe.  7. The aortic valve is normal in structure. Aortic valve regurgitation is not visualized. FINDINGS  Left Ventricle: Left ventricular ejection fraction, by estimation, is 45 to 50%. The left ventricle has mildly decreased function. The left ventricle demonstrates global hypokinesis. The left ventricular internal cavity size was normal in size. There is  no left ventricular hypertrophy. Left ventricular diastolic parameters were normal. Right Ventricle: The right ventricular size is mildly enlarged. Right vetricular wall thickness was not well visualized. Right ventricular systolic function is normal. Left Atrium: Left atrial size was normal in size. Right Atrium: Right  atrial size was mildly dilated. Pericardium: There is no evidence of pericardial effusion. Mitral Valve: The MR is eccentric and may be more severe than what it appears. The valve is thickened and I cannot rule out vegetation on the mitral valve. Suggest TEE for further assessment of his valves. The mitral valve is abnormal. There is moderate thickening of the mitral valve leaflet(s). Moderate to severe mitral valve regurgitation. Tricuspid Valve: There appears to be a vegetation attached to the tricupsid valve. The tricuspid valve is abnormal. Tricuspid valve regurgitation is severe. Aortic Valve: The aortic valve is normal in structure. Aortic valve regurgitation is not visualized. Aortic valve mean gradient measures 26.7 mmHg. Aortic valve peak gradient measures 49.5 mmHg. Aortic valve area, by VTI measures 0.86 cm. Pulmonic Valve: The pulmonic valve was normal in structure. Pulmonic valve regurgitation is not visualized. Aorta: The aortic root and ascending aorta are structurally normal, with no evidence of dilitation. IAS/Shunts: The atrial septum is grossly normal. Additional Comments: Suggest TEE for further assessment of his valves.  LEFT VENTRICLE PLAX 2D LVIDd:         5.80 cm LVIDs:         4.10 cm LV PW:         1.00 cm LV IVS:        1.20 cm LVOT diam:     2.00 cm LV SV:         57 LV SV Index:   28 LVOT Area:  3.14 cm  RIGHT VENTRICLE             IVC RV Basal diam:  3.20 cm     IVC diam: 2.30 cm RV S prime:     12.80 cm/s TAPSE (M-mode): 2.6 cm LEFT ATRIUM             Index       RIGHT ATRIUM           Index LA diam:        3.50 cm 1.75 cm/m  RA Area:     19.20 cm LA Vol (A2C):   73.8 ml 36.96 ml/m RA Volume:   52.20 ml  26.14 ml/m LA Vol (A4C):   68.6 ml 34.35 ml/m LA Biplane Vol: 75.2 ml 37.66 ml/m  AORTIC VALVE AV Area (Vmax):    0.73 cm AV Area (Vmean):   0.82 cm AV Area (VTI):     0.86 cm AV Vmax:           351.67 cm/s AV Vmean:          233.333 cm/s AV VTI:            0.657 m AV  Peak Grad:      49.5 mmHg AV Mean Grad:      26.7 mmHg LVOT Vmax:         82.20 cm/s LVOT Vmean:        61.200 cm/s LVOT VTI:          0.180 m LVOT/AV VTI ratio: 0.27  AORTA Ao Root diam: 3.80 cm Ao Asc diam:  3.20 cm MITRAL VALVE MV Area (PHT): 4.06 cm    SHUNTS MV Decel Time: 187 msec    Systemic VTI:  0.18 m MV E velocity: 99.40 cm/s  Systemic Diam: 2.00 cm MV A velocity: 49.30 cm/s MV E/A ratio:  2.02 Mertie Moores MD Electronically signed by Mertie Moores MD Signature Date/Time: 10/25/2019/5:04:40 PM    Final      Assessment/Plan: Prev Endocarditis MSSA bacteremia IVDA indigestion   Total days of antibiotics: 2 ancef     Hepatitis and HIV studies (-) Will start Hep A/B vax Repeat Bcx ordered for AM.  TEE would be optimal for this pt. He agrees to have TEE when indigestion controlled.  Will defer this to his primary team.       Bobby Rumpf MD, FACP Infectious Diseases (pager) [phone] [email or web address] 10/27/2019, 12:03 PM  LOS: 2 days

## 2019-10-27 NOTE — Progress Notes (Signed)
Patient came to front desk and reported IV came out. Attempted to start IV. Patient became aggressive when attempting to start IV. IV not started, no IV access currently.

## 2019-10-27 NOTE — Progress Notes (Signed)
Patient disconnected self from IV stating "y'all weren't coming down to stop it". Informed patient not to interact with IV or attached IV equipment for safety and infection prevention concerns. Patient expressed noncompliance with this policy.

## 2019-10-27 NOTE — Progress Notes (Signed)
Pt returned to floor, stated the heart burn is better

## 2019-10-27 NOTE — Progress Notes (Signed)
Cardiology aware of pt agreeing to TEE

## 2019-10-27 NOTE — Plan of Care (Signed)
  Problem: Activity: Goal: Risk for activity intolerance will decrease Outcome: Progressing   

## 2019-10-28 ENCOUNTER — Encounter (HOSPITAL_COMMUNITY): Payer: Self-pay | Admitting: Internal Medicine

## 2019-10-28 ENCOUNTER — Encounter (HOSPITAL_COMMUNITY)
Admission: EM | Disposition: A | Discharge status: Home / Self Care | Payer: Self-pay | Source: Home / Self Care | Attending: Family Medicine

## 2019-10-28 DIAGNOSIS — R7881 Bacteremia: Secondary | ICD-10-CM | POA: Diagnosis present

## 2019-10-28 DIAGNOSIS — I058 Other rheumatic mitral valve diseases: Secondary | ICD-10-CM

## 2019-10-28 SURGERY — CANCELLED PROCEDURE
Anesthesia: Monitor Anesthesia Care

## 2019-10-28 MED ORDER — BUPRENORPHINE HCL-NALOXONE HCL 8-2 MG SL SUBL
1.0000 | SUBLINGUAL_TABLET | Freq: Three times a day (TID) | SUBLINGUAL | Status: DC
  Administered 2019-10-28 – 2019-10-29 (×4): 1 via SUBLINGUAL
  Filled 2019-10-28 (×4): qty 1

## 2019-10-28 NOTE — Progress Notes (Addendum)
[facility]  Date of Admission:  10/24/2019      Total days of antibiotics 5  Cefazolin day 4            ASSESSMENT: Timothy Christensen is a 24 y.o. male with a history of injection drug use (states has been clean > 3 months but continues to smoke crack) here with chills, MSSA bacteremia and thickening of mitral valve with MR; he has enough minor findings to be considered for endocarditis that would warrant full 6 weeks of treatment. I tried to explain to him that the TEE is more sensitive for smaller vegetations and can be valuable when considering surgical planning (which he has been told he will need). Would ensure he has follow up with cardiology outpatient after his stay.   Discussed treatment options for left sided staph aureus endocarditis - he would be willing to stay 2 weeks for supervised IV therapy then continue treatment with Linezolid PO BID for 4 additional weeks.   His labs from today (including repeat blood cultures are still pending). Will need to make sure they are done to ensure clearance of bacteremia.   Hepatitis and HIV panels negative     PLAN: 1. Please ensure repeat blood cultures are drawn 2. Continue IV cefazolin - planning 2 weeks in hospital then D/C with Linezolid 600 mg BID (will send with him via [facility] clinic).  3. Needs cardiology follow up outpatient.    Principal Problem:   Endocarditis Active Problems:   IVDU (intravenous drug user)/Opiates/Cocaine   Polysubstance abuse /Cocaine and Opiates   . buprenorphine-naloxone  1 tablet Sublingual BID  . diazepam  25 mg Oral TID  . gabapentin  400 mg Oral TID  . hepatitis A virus (PF) vaccine  1 mL Intramuscular Once  . hepatitis b vaccine for adults  1 mL Intramuscular Once  . pantoprazole  40 mg Oral BID  . sodium chloride flush  3 mL Intravenous Q12H    SUBJECTIVE: Frustrated today. Did not see the point in doing the TEE and did not want his throat to be sore.  Requesting to be seen by cardiology / surgeons because he has been told int he past he will need corrective surgery for valve.  He wonders if this is due to the fact he did not take previous course of PO antibiotics "the right way."    Review of Systems: Review of Systems  Constitutional: Positive for malaise/fatigue. Negative for fever.  Respiratory: Negative for cough.   Cardiovascular: Negative for chest pain, orthopnea and leg swelling.  Gastrointestinal: Negative for abdominal pain, nausea and vomiting.  Skin: Negative for rash.  Neurological: Negative for headaches.    No Known Allergies  OBJECTIVE: Vitals:   10/27/19 0512 10/27/19 0513 10/27/19 1636 10/28/19 0729  BP:  114/77 105/64 109/60  Pulse:  88 81 84  Resp:  18 18 11   Temp:  97.9 F (36.6 C) 98.7 F (37.1 C) (!) 97.5 F (36.4 C)  TempSrc:  Oral Oral Oral  SpO2:  100% 98% 99%  Weight: 70.7 kg     Height:       Body mass index is 21.13 kg/m.  Physical Exam Cardiovascular:     Rate and Rhythm: Normal rate and regular rhythm.     Heart sounds: Murmur (3/6 holosystolic murmur ) heard.   Pulmonary:     Effort: Pulmonary effort is normal.     Breath sounds: Normal  breath sounds.  Abdominal:     General: Bowel sounds are normal.     Palpations: Abdomen is soft.     Tenderness: There is no abdominal tenderness.  Skin:    General: Skin is warm and dry.     Capillary Refill: Capillary refill takes less than 2 seconds.  Neurological:     Mental Status: He is alert and oriented to person, place, and time.     Lab Results Lab Results  Component Value Date   WBC 15.2 (H) 10/27/2019   HGB 11.8 (L) 10/27/2019   HCT 37.0 (L) 10/27/2019   MCV 94.4 10/27/2019   PLT 360 10/27/2019    Lab Results  Component Value Date   CREATININE 1.46 (H) 10/27/2019   BUN 25 (H) 10/27/2019   NA 140 10/27/2019   K 3.9 10/27/2019   CL 105 10/27/2019   CO2 25 10/27/2019    Lab Results  Component Value Date   ALT 32  10/26/2019   AST 20 10/26/2019   ALKPHOS 73 10/26/2019   BILITOT 0.4 10/26/2019     Microbiology: Recent Results (from the past 240 hour(s))  Urine culture     Status: None   Collection Time: 10/25/19  3:35 AM   Specimen: Urine, Catheterized  Result Value Ref Range Status   Specimen Description URINE, CATHETERIZED  Final   Special Requests Normal  Final   Culture   Final    NO GROWTH Performed at [facility], [address], [place], [place] [address]    Report Status 10/26/2019 FINAL  Final  Blood culture (routine x 2)     Status: Abnormal   Collection Time: 10/25/19  4:27 AM   Specimen: BLOOD  Result Value Ref Range Status   Specimen Description BLOOD LEFT ANTECUBITAL  Final   Special Requests   Final    BOTTLES DRAWN AEROBIC AND ANAEROBIC Blood Culture adequate volume   Culture  Setup Time   Final    GRAM POSITIVE COCCI IN CLUSTERS IN BOTH AEROBIC AND ANAEROBIC BOTTLES CRITICAL RESULT CALLED TO, READ BACK BY AND VERIFIED WITH: H,VONDOHLEN PHARMD @2145  10/25/19 EB Performed at [facility], [address] [place], [place]    Culture STAPHYLOCOCCUS AUREUS (A)  Final   Report Status 10/27/2019 FINAL  Final   Organism ID, Bacteria STAPHYLOCOCCUS AUREUS  Final      Susceptibility   Staphylococcus aureus - MIC*    CIPROFLOXACIN <=0.5 SENSITIVE Sensitive     ERYTHROMYCIN RESISTANT Resistant     GENTAMICIN <=0.5 SENSITIVE Sensitive     OXACILLIN 0.5 SENSITIVE Sensitive     TETRACYCLINE <=1 SENSITIVE Sensitive     VANCOMYCIN <=0.5 SENSITIVE Sensitive     TRIMETH/SULFA <=10 SENSITIVE Sensitive     CLINDAMYCIN RESISTANT Resistant     RIFAMPIN <=0.5 SENSITIVE Sensitive     Inducible Clindamycin POSITIVE Resistant     * STAPHYLOCOCCUS AUREUS  SARS Coronavirus 2 by RT PCR (hospital order, performed in [facility] hospital lab) Nasopharyngeal Nasopharyngeal Swab     Status: None   Collection Time: 10/25/19  4:27 AM   Specimen: Nasopharyngeal Swab  Result  Value Ref Range Status   SARS Coronavirus 2 NEGATIVE NEGATIVE Final    Comment: (NOTE) SARS-CoV-2 target nucleic acids are NOT DETECTED.  The SARS-CoV-2 RNA is generally detectable in upper and lower respiratory specimens during the acute phase of infection. The lowest concentration of SARS-CoV-2 viral copies this assay can detect is 250 copies /  mL. A negative result does not preclude SARS-CoV-2 infection and should not be used as the sole basis for treatment or other patient management decisions.  A negative result may occur with improper specimen collection / handling, submission of specimen other than nasopharyngeal swab, presence of viral mutation(s) within the areas targeted by this assay, and inadequate number of viral copies (<250 copies / mL). A negative result must be combined with clinical observations, patient history, and epidemiological information.  Fact Sheet for Patients:   BoilerBrush.com.cy  Fact Sheet for Healthcare Providers: https://pope.com/  This test is not yet approved or  cleared by the [place] FDA and has been authorized for detection and/or diagnosis of SARS-CoV-2 by FDA under an Emergency Use Authorization (EUA).  This EUA will remain in effect (meaning this test can be used) for the duration of the COVID-19 declaration under Section 564(b)(1) of the Act, 21 U.S.C. section 360bbb-3(b)(1), unless the authorization is terminated or revoked sooner.  Performed at [facility], [address] [place], [place] [address]   Blood Culture ID Panel (Reflexed)     Status: Abnormal   Collection Time: 10/25/19  4:27 AM  Result Value Ref Range Status   Enterococcus faecalis NOT DETECTED NOT DETECTED Final   Enterococcus Faecium NOT DETECTED NOT DETECTED Final   Listeria monocytogenes NOT DETECTED NOT DETECTED Final   Staphylococcus species DETECTED (A) NOT DETECTED Final    Comment: CRITICAL RESULT  CALLED TO, READ BACK BY AND VERIFIED WITH: H,VONDOHLEN PHARMD @2145  10/25/19 EB    Staphylococcus aureus (BCID) DETECTED (A) NOT DETECTED Final    Comment: CRITICAL RESULT CALLED TO, READ BACK BY AND VERIFIED WITH: H,VONDOHLEN PHARMD @2145  10/25/19 EB    Staphylococcus epidermidis NOT DETECTED NOT DETECTED Final   Staphylococcus lugdunensis NOT DETECTED NOT DETECTED Final   Streptococcus species NOT DETECTED NOT DETECTED Final   Streptococcus agalactiae NOT DETECTED NOT DETECTED Final   Streptococcus pneumoniae NOT DETECTED NOT DETECTED Final   Streptococcus pyogenes NOT DETECTED NOT DETECTED Final   A.calcoaceticus-baumannii NOT DETECTED NOT DETECTED Final   Bacteroides fragilis NOT DETECTED NOT DETECTED Final   Enterobacterales NOT DETECTED NOT DETECTED Final   Enterobacter cloacae complex NOT DETECTED NOT DETECTED Final   Escherichia coli NOT DETECTED NOT DETECTED Final   Klebsiella aerogenes NOT DETECTED NOT DETECTED Final   Klebsiella oxytoca NOT DETECTED NOT DETECTED Final   Klebsiella pneumoniae NOT DETECTED NOT DETECTED Final   Proteus species NOT DETECTED NOT DETECTED Final   Salmonella species NOT DETECTED NOT DETECTED Final   Serratia marcescens NOT DETECTED NOT DETECTED Final   Haemophilus influenzae NOT DETECTED NOT DETECTED Final   Neisseria meningitidis NOT DETECTED NOT DETECTED Final   Pseudomonas aeruginosa NOT DETECTED NOT DETECTED Final   Stenotrophomonas maltophilia NOT DETECTED NOT DETECTED Final   Candida albicans NOT DETECTED NOT DETECTED Final   Candida auris NOT DETECTED NOT DETECTED Final   Candida glabrata NOT DETECTED NOT DETECTED Final   Candida krusei NOT DETECTED NOT DETECTED Final   Candida parapsilosis NOT DETECTED NOT DETECTED Final   Candida tropicalis NOT DETECTED NOT DETECTED Final   Cryptococcus neoformans/gattii NOT DETECTED NOT DETECTED Final   Meth resistant mecA/C and MREJ NOT DETECTED NOT DETECTED Final    Comment: Performed at [facility], [address], [employer], [address] [place]  Blood culture (routine x 2)     Status: None (Preliminary result)   Collection Time: 10/25/19  6:30 AM   Specimen: BLOOD  RIGHT HAND  Result Value Ref Range Status   Specimen Description BLOOD RIGHT HAND  Final   Special Requests   Final    BOTTLES DRAWN AEROBIC AND ANAEROBIC Blood Culture adequate volume   Culture   Final    NO GROWTH 2 DAYS Performed at [facility], [address] [place], [place] [address]    Report Status PENDING  Incomplete     Rexene AlbertsStephanie Stefania Goulart, MSN, NP-C [facility]  [email or web address] Pager: [phone] Office: [phone] [facility] Main Line: [phone]

## 2019-10-28 NOTE — Interval H&P Note (Signed)
History and Physical Interval Note:  10/28/2019 7:51 AM  Timothy Christensen  has presented today for surgery, with the diagnosis of BACTERIMIA IV DRUG USE.  The various methods of treatment have been discussed with the patient and family. After consideration of risks, benefits and other options for treatment, the patient has consented to  Procedure(s): TRANSESOPHAGEAL ECHOCARDIOGRAM (TEE) (N/A) as a surgical intervention.  The patient's history has been reviewed, patient examined, no change in status, stable for surgery.  I have reviewed the patient's chart and labs.  Questions were answered to the patient's satisfaction.     Armanda Magic

## 2019-10-28 NOTE — Progress Notes (Signed)
Pt. Requesting snack and informed of his scheduled procedure that requires him to have nothing by mouth at this time. Pt. Stated that he was going to leave the unit. Pt. Informed by RN that he did not have order to leave the floor.  Pt. Disconnected IV and left the unit.

## 2019-10-28 NOTE — Progress Notes (Signed)
Patient ID: Timothy Christensen, male   DOB: November 23, 1995, 24 y.o.   MRN: [id number]          [facility]    Date of Admission:  10/24/2019     I have seen and examined Mr. Vallee and discussed his management with Rexene Alberts, NP.  I am in full agreement with her assessment and plans.  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  Repeat blood cultures obtained yesterday are negative so far. He has a 2/6 systolic murmur and mitral regurgitation noted on TTE.  We recommend a standard course of antibiotic therapy for probable endocarditis.  He is agreeable to staying in the hospital for now.         Cliffton Asters, MD [facility] [phone] pager   [phone] cell 10/28/2019, 11:44 AM

## 2019-10-28 NOTE — Significant Event (Signed)
HOSPITAL MEDICINE OVERNIGHT EVENT NOTE    Notified by nursing that patient is repeatedly leaving the unit for extended periods of time without permission.  This has been ongoing for at least the past several days.  Patient seems to give different reasoning on each occasion for leaving, this time stating that he needs a snack.  Security has been sent to retrieve the patient and bring him back to his room.  Patient has been informed that he cannot eat prior to his TEE later today and he has also been informed that he may no longer leave the unit in preparation for his procedure.  Marinda Elk  MD [facility]

## 2019-10-28 NOTE — Progress Notes (Signed)
HOSPITAL MEDICINE OVERNIGHT EVENT NOTE    Notified by nursing that patient has once again eloped off the floor, this time approximately for 15 to 20 minutes before coming back of his own volition.  Patient has been instructed numerous times in the past not to leave the floor but unfortunately has eloped on numerous occasions regardless.  Additionally, patient refuses to wear telemetry monitoring.  Nursing has instructed the patient that if he leaves the floor he may be administratively discharged in the hospital.    I have asked that the first step that is taken after the patient leaves to [place] to call security and try to escort him back immediately.  Marinda Elk  MD [facility]

## 2019-10-28 NOTE — Progress Notes (Signed)
Pt. Off the floor. Stated he was going for a walk around the unit.

## 2019-10-28 NOTE — Progress Notes (Signed)
Patient transported down to Endo and refused to sign consent.  He states that he does not see the point in a TEE when his TTEs have always shown a vegetation.  He is combative and states that he is not going to stay in Endo because he wants to eat.  I explained to him that a TTE does not always show a vegetation and that is why we need to do the TEE as well as assess his MR as he will need MV surgery at some point.  He told me and the CNRA that he is not about to stay down here when he is hungry.  He says that he will not stay down here after the procedure for post op monitoring and will leave on his own.  Patient demanded to go back upstairs and procedure cancelled.

## 2019-10-28 NOTE — Progress Notes (Signed)
MD messaged let him know patient wanting to walk off unit he states needs exercise told him he can walk up down hall like everybody else. Patient states going to do what the hell he wants to not going to listen. He was instructed if he leaves the unit he is AMA MD agreed with this. Patient got mad went back to his room.

## 2019-10-28 NOTE — Progress Notes (Signed)
Patient requests that no information be given to his emergency contact (mom). Thanks

## 2019-10-28 NOTE — Progress Notes (Signed)
Pt went off the floor without permission, when this RN went to patient room pt is not in the room, pt disconnected his IV. Pt has been informed several times that he should not be leaving the floor. Pt was out approximately 15-20 minutes, when he returned, pt explained that nobody told him that he can't go walk off the unit, he was only told that he can't leave the building and that he was just trying to exercise and walk off the floor, pt has been instructed several times not to leave the floor. Pt again was instructed that the next time he'll leave the floor he will be discharged AMA. MD made aware.

## 2019-10-28 NOTE — Progress Notes (Signed)
Unable to obtain vital signs at this time, patient refused,  pt is irritable and also refused assessment.

## 2019-10-28 NOTE — Plan of Care (Signed)
  Problem: Education: Goal: Knowledge of General Education information will improve Description: Including pain rating scale, medication(s)/side effects and non-pharmacologic comfort measures Outcome: Progressing   Problem: Fluid Volume: Goal: Hemodynamic stability will improve Outcome: Progressing   Problem: Clinical Measurements: Goal: Diagnostic test results will improve Outcome: Progressing Goal: Signs and symptoms of infection will decrease Outcome: Progressing   Problem: Respiratory: Goal: Ability to maintain adequate ventilation will improve Outcome: Progressing   Problem: Education: Goal: Knowledge of General Education information will improve Description: Including pain rating scale, medication(s)/side effects and non-pharmacologic comfort measures Outcome: Progressing   Problem: Health Behavior/Discharge Planning: Goal: Ability to manage health-related needs will improve Outcome: Progressing   Problem: Clinical Measurements: Goal: Ability to maintain clinical measurements within normal limits will improve Outcome: Progressing Goal: Will remain free from infection Outcome: Progressing Goal: Diagnostic test results will improve Outcome: Progressing Goal: Respiratory complications will improve Outcome: Progressing Goal: Cardiovascular complication will be avoided Outcome: Progressing   Problem: Activity: Goal: Risk for activity intolerance will decrease Outcome: Progressing   Problem: Nutrition: Goal: Adequate nutrition will be maintained Outcome: Progressing   Problem: Coping: Goal: Level of anxiety will decrease Outcome: Progressing   Problem: Elimination: Goal: Will not experience complications related to bowel motility Outcome: Progressing Goal: Will not experience complications related to urinary retention Outcome: Progressing   Problem: Pain Managment: Goal: General experience of comfort will improve Outcome: Progressing   Problem:  Safety: Goal: Ability to remain free from injury will improve Outcome: Progressing   Problem: Skin Integrity: Goal: Risk for impaired skin integrity will decrease Outcome: Progressing   Problem: Fluid Volume: Goal: Hemodynamic stability will improve Outcome: Progressing   Problem: Clinical Measurements: Goal: Diagnostic test results will improve Outcome: Progressing Goal: Signs and symptoms of infection will decrease Outcome: Progressing   Problem: Respiratory: Goal: Ability to maintain adequate ventilation will improve Outcome: Progressing

## 2019-10-28 NOTE — Progress Notes (Signed)
PROGRESS NOTE    Timothy Christensen  FOY:[id number] DOB: [birth year] DOA: 10/24/2019 PCP: Patient, No Pcp Per    Brief Narrative: 24 years old male with PMH significant for polysubstance IV drug abuse, History of partially or untreated infective endocarditis( stated he was treated with oral antibiotics) .  He was doing fine until 6 weeks ago when he started having this progressive shortness of breath.  He is found to have positive blood cultures for MSSA , Echocardiogram shows reduced EF 40-45% but no vegetation.  Cardiology is consulted to do TEE.  Patient was vancomycin and ceftriaxone transitioned to Ancef. Patient has been very demanding with regards to polysubstance abuse, He is taking very high doses of Valium 25 mg 3 times daily and Suboxone.  There is a documentation dated he has left AGAINST MEDICAL ADVICE from [facility].  Patient refused to have TEE, procedure cancelled. ID recommended 2 weeks of IV Ancef followed by linezolid for 4 weeks. (Total duration 6 weeks of antibiotics).  Patient agreed to stay 2 weeks in the hospital to receive IV Ancef.  Assessment & Plan:   Principal Problem:   MSSA bacteremia Active Problems:   IVDU (intravenous drug user)/Opiates/Cocaine   Polysubstance abuse /Cocaine and Opiates   Endocarditis of mitral valve   Suspected endocarditis   MSSA Bacteremia with hx.of enterococcal mitral valve endocarditis :  He presented with general malaise and reported fever with leukocytosis: This could be recurrent/untreated endocarditis. Admitted for suspected infective endocarditis. Started on vancomycin and ceftriaxone. Blood cultures grew MSSA antibiotic transition to Ancef. ID consult appreciated. Echocardiogram shows EF 40 to 45%.  There is no vegetation. Cardiology consulted for TEE to rule out endocarditis. His Hb dropped from 12.3 >> 8.4, repeat 11.8. Patient refused to have TEE, procedure canceled. ID consulted recommended total 6 weeks of antibiotics. Patient  agreed to get IV Ancef for 2 weeks in hospital followed by 4 weeks of linezolid.   Chronic systolic heart failure.: Patient does not appear to be in decompensated heart failure,  lungs are clear, he denies shortness of breath or DOE. Cardiology consulted, awaiting recommendation. TTE: LVEF 40-45% consistent with systolic heart failure. TEE scheduled for today but cancelled, Patient refused to have TEE.  Polysubstance abuse Medicine reconciliation shows Suboxone and very high doses of Valium Will continue Suboxone Patient is placed on his Valium 25 mg 3 times daily confirmed with [facility] pharmacy.   DVT prophylaxis: SCDs. Code Status: Full Family Communication: No one at bed side. Disposition Plan: Status is: Inpatient  Remains inpatient appropriate because:IV treatments appropriate due to intensity of illness or inability to take PO   Dispo: The patient is from: Home              Anticipated d/c is to: Home              Anticipated d/c date is: 2 weeks.              Patient currently is not medically stable to d/c.   Consultants:   Infectious Diseases, Cardiology.  Procedures: TTE, schedule TEE  Antimicrobials:  Anti-infectives (From admission, onward)   Start     Dose/Rate Route Frequency Ordered Stop   10/25/19 2300  ceFAZolin (ANCEF) IVPB 2g/100 mL premix        2 g 200 mL/hr over 30 Minutes Intravenous Every 8 hours 10/25/19 2205     10/25/19 1400  vancomycin (VANCOCIN) IVPB 1000 mg/200 mL premix  Status:  Discontinued  1,000 mg 200 mL/hr over 60 Minutes Intravenous Every 8 hours 10/25/19 1047 10/25/19 1358   10/25/19 0630  vancomycin (VANCOREADY) IVPB 1500 mg/300 mL  Status:  Discontinued        1,500 mg 150 mL/hr over 120 Minutes Intravenous Every 24 hours 10/25/19 0618 10/25/19 0622   10/25/19 0630  ceFEPIme (MAXIPIME) 2 g in sodium chloride 0.9 % 100 mL IVPB  Status:  Discontinued        2 g 200 mL/hr over 30 Minutes Intravenous  Once 10/25/19 0618  10/25/19 1358   10/25/19 0430  vancomycin (VANCOCIN) IVPB 1000 mg/200 mL premix  Status:  Discontinued        1,000 mg 200 mL/hr over 60 Minutes Intravenous  Once 10/25/19 0415 10/25/19 0421   10/25/19 0430  ceFEPIme (MAXIPIME) 2 g in sodium chloride 0.9 % 100 mL IVPB        2 g 200 mL/hr over 30 Minutes Intravenous STAT 10/25/19 0416 10/25/19 0610   10/25/19 0430  vancomycin (VANCOREADY) IVPB 2000 mg/400 mL        2,000 mg 200 mL/hr over 120 Minutes Intravenous  Once 10/25/19 0421 10/25/19 0821     Subjective: Patient was seen and examined at bedside.  Overnight events noted.   Patient went for TEE but refused.  Procedure canceled.  He agreed to stay in hospital for 2 weeks of IV Ancef as per ID recommendation. He appears upset about his suboxone not being increased.  Objective: Vitals:   10/27/19 1636 10/28/19 0729 10/28/19 1000 10/28/19 1148  BP: 105/64 109/60 108/64 116/64  Pulse: 81 84 85 84  Resp: 18 11  18   Temp: 98.7 F (37.1 C) (!) 97.5 F (36.4 C)  98 F (36.7 C)  TempSrc: Oral Oral  Oral  SpO2: 98% 99%  100%  Weight:      Height:        Intake/Output Summary (Last 24 hours) at 10/28/2019 1450 Last data filed at 10/27/2019 2133 Gross per 24 hour  Intake 720 ml  Output --  Net 720 ml   Filed Weights   10/24/19 1451 10/25/19 1702 10/27/19 0512  Weight: 77.9 kg 65.8 kg 70.7 kg    Examination:  General exam: Appears calm and comfortable,  Upset. Respiratory system: Clear to auscultation. Respiratory effort normal. Cardiovascular system: S1 & S2 heard, RRR. No JVD, No rubs, gallops or clicks. Murmur++,  no pedal edema. Gastrointestinal system: Abdomen is nondistended, soft and nontender. No organomegaly or masses felt.  Normal bowel sounds heard. Central nervous system: Alert and oriented. No focal neurological deficits. Extremities: No edema, No cyanosis, No clubbing. Skin: No rashes, lesions or ulcers Psychiatry: Judgement and insight appear normal. Mood &  affect appropriate.     Data Reviewed: I have personally reviewed following labs and imaging studies  CBC: Recent Labs  Lab 10/24/19 1452 10/25/19 1701 10/26/19 0649 10/27/19 0500 10/27/19 1942  WBC 15.5* 9.4 5.6 15.2*  --   NEUTROABS  --  8.4*  --   --   --   HGB 13.5 13.3 12.3* 8.4* 11.8*  HCT 41.9 42.0 38.8* 28.4* 37.0*  MCV 87.5 88.4 85.7 94.4  --   PLT 209 220 188 360  --    Basic Metabolic Panel: Recent Labs  Lab 10/24/19 1452 10/25/19 0630 10/26/19 0649 10/27/19 0500  NA 139 139 142 140  K 4.0 4.0 4.2 3.9  CL 103 107 106 105  CO2 23 25 26 25   GLUCOSE 131*  90 111* 87  BUN 10 11 12  25*  CREATININE 1.32* 0.77 0.81 1.46*  CALCIUM 9.0 8.5* 8.9 9.0  MG  --   --   --  1.8  PHOS  --   --   --  3.4   GFR: Estimated Creatinine Clearance: 76.7 mL/min (A) (by C-G formula based on SCr of 1.46 mg/dL (H)). Liver Function Tests: Recent Labs  Lab 10/25/19 0630 10/26/19 0649  AST 48* 20  ALT 48* 32  ALKPHOS 63 73  BILITOT 0.6 0.4  PROT 5.7* 5.5*  ALBUMIN 3.4* 3.1*   No results for input(s): LIPASE, AMYLASE in the last 168 hours. No results for input(s): AMMONIA in the last 168 hours. Coagulation Profile: No results for input(s): INR, PROTIME in the last 168 hours. Cardiac Enzymes: Recent Labs  Lab 10/25/19 0427  CKTOTAL 37*   BNP (last 3 results) No results for input(s): PROBNP in the last 8760 hours. HbA1C: No results for input(s): HGBA1C in the last 72 hours. CBG: No results for input(s): GLUCAP in the last 168 hours. Lipid Profile: No results for input(s): CHOL, HDL, LDLCALC, TRIG, CHOLHDL, LDLDIRECT in the last 72 hours. Thyroid Function Tests: No results for input(s): TSH, T4TOTAL, FREET4, T3FREE, THYROIDAB in the last 72 hours. Anemia Panel: No results for input(s): VITAMINB12, FOLATE, FERRITIN, TIBC, IRON, RETICCTPCT in the last 72 hours. Sepsis Labs: Recent Labs  Lab 10/25/19 0427 10/25/19 0630  PROCALCITON  --  4.90  LATICACIDVEN 1.5 0.8      Recent Results (from the past 240 hour(s))  Urine culture     Status: None   Collection Time: 10/25/19  3:35 AM   Specimen: Urine, Catheterized  Result Value Ref Range Status   Specimen Description URINE, CATHETERIZED  Final   Special Requests Normal  Final   Culture   Final    NO GROWTH Performed at [facility], [address] [place], [place]    Report Status 10/26/2019 FINAL  Final  Blood culture (routine x 2)     Status: Abnormal   Collection Time: 10/25/19  4:27 AM   Specimen: BLOOD  Result Value Ref Range Status   Specimen Description BLOOD LEFT ANTECUBITAL  Final   Special Requests   Final    BOTTLES DRAWN AEROBIC AND ANAEROBIC Blood Culture adequate volume   Culture  Setup Time   Final    GRAM POSITIVE COCCI IN CLUSTERS IN BOTH AEROBIC AND ANAEROBIC BOTTLES CRITICAL RESULT CALLED TO, READ BACK BY AND VERIFIED WITH: H,VONDOHLEN PHARMD @2145  10/25/19 EB Performed at [facility], [address], [place], [address] [place]    Culture STAPHYLOCOCCUS AUREUS (A)  Final   Report Status 10/27/2019 FINAL  Final   Organism ID, Bacteria STAPHYLOCOCCUS AUREUS  Final      Susceptibility   Staphylococcus aureus - MIC*    CIPROFLOXACIN <=0.5 SENSITIVE Sensitive     ERYTHROMYCIN RESISTANT Resistant     GENTAMICIN <=0.5 SENSITIVE Sensitive     OXACILLIN 0.5 SENSITIVE Sensitive     TETRACYCLINE <=1 SENSITIVE Sensitive     VANCOMYCIN <=0.5 SENSITIVE Sensitive     TRIMETH/SULFA <=10 SENSITIVE Sensitive     CLINDAMYCIN RESISTANT Resistant     RIFAMPIN <=0.5 SENSITIVE Sensitive     Inducible Clindamycin POSITIVE Resistant     * STAPHYLOCOCCUS AUREUS  SARS Coronavirus 2 by RT PCR (hospital order, performed in [facility] hospital lab) Nasopharyngeal Nasopharyngeal Swab     Status: None  Collection Time: 10/25/19  4:27 AM   Specimen: Nasopharyngeal Swab  Result Value Ref Range Status   SARS Coronavirus 2 NEGATIVE NEGATIVE Final    Comment:  (NOTE) SARS-CoV-2 target nucleic acids are NOT DETECTED.  The SARS-CoV-2 RNA is generally detectable in upper and lower respiratory specimens during the acute phase of infection. The lowest concentration of SARS-CoV-2 viral copies this assay can detect is 250 copies / mL. A negative result does not preclude SARS-CoV-2 infection and should not be used as the sole basis for treatment or other patient management decisions.  A negative result may occur with improper specimen collection / handling, submission of specimen other than nasopharyngeal swab, presence of viral mutation(s) within the areas targeted by this assay, and inadequate number of viral copies (<250 copies / mL). A negative result must be combined with clinical observations, patient history, and epidemiological information.  Fact Sheet for Patients:   BoilerBrush.com.cyhttps://www.fda.gov/media/136312/download  Fact Sheet for Healthcare Providers: https://pope.com/https://www.fda.gov/media/136313/download  This test is not yet approved or  cleared by the [place] FDA and has been authorized for detection and/or diagnosis of SARS-CoV-2 by FDA under an Emergency Use Authorization (EUA).  This EUA will remain in effect (meaning this test can be used) for the duration of the COVID-19 declaration under Section 564(b)(1) of the Act, 21 U.S.C. section 360bbb-3(b)(1), unless the authorization is terminated or revoked sooner.  Performed at [facility], [address] [place], [place] [address]   Blood Culture ID Panel (Reflexed)     Status: Abnormal   Collection Time: 10/25/19  4:27 AM  Result Value Ref Range Status   Enterococcus faecalis NOT DETECTED NOT DETECTED Final   Enterococcus Faecium NOT DETECTED NOT DETECTED Final   Listeria monocytogenes NOT DETECTED NOT DETECTED Final   Staphylococcus species DETECTED (A) NOT DETECTED Final    Comment: CRITICAL RESULT CALLED TO, READ BACK BY AND VERIFIED WITH: H,VONDOHLEN PHARMD @2145  10/25/19 EB     Staphylococcus aureus (BCID) DETECTED (A) NOT DETECTED Final    Comment: CRITICAL RESULT CALLED TO, READ BACK BY AND VERIFIED WITH: H,VONDOHLEN PHARMD @2145  10/25/19 EB    Staphylococcus epidermidis NOT DETECTED NOT DETECTED Final   Staphylococcus lugdunensis NOT DETECTED NOT DETECTED Final   Streptococcus species NOT DETECTED NOT DETECTED Final   Streptococcus agalactiae NOT DETECTED NOT DETECTED Final   Streptococcus pneumoniae NOT DETECTED NOT DETECTED Final   Streptococcus pyogenes NOT DETECTED NOT DETECTED Final   A.calcoaceticus-baumannii NOT DETECTED NOT DETECTED Final   Bacteroides fragilis NOT DETECTED NOT DETECTED Final   Enterobacterales NOT DETECTED NOT DETECTED Final   Enterobacter cloacae complex NOT DETECTED NOT DETECTED Final   Escherichia coli NOT DETECTED NOT DETECTED Final   Klebsiella aerogenes NOT DETECTED NOT DETECTED Final   Klebsiella oxytoca NOT DETECTED NOT DETECTED Final   Klebsiella pneumoniae NOT DETECTED NOT DETECTED Final   Proteus species NOT DETECTED NOT DETECTED Final   Salmonella species NOT DETECTED NOT DETECTED Final   Serratia marcescens NOT DETECTED NOT DETECTED Final   Haemophilus influenzae NOT DETECTED NOT DETECTED Final   Neisseria meningitidis NOT DETECTED NOT DETECTED Final   Pseudomonas aeruginosa NOT DETECTED NOT DETECTED Final   Stenotrophomonas maltophilia NOT DETECTED NOT DETECTED Final   Candida albicans NOT DETECTED NOT DETECTED Final   Candida auris NOT DETECTED NOT DETECTED Final   Candida glabrata NOT DETECTED NOT DETECTED Final   Candida krusei NOT DETECTED NOT DETECTED Final   Candida parapsilosis NOT DETECTED NOT DETECTED Final  Candida tropicalis NOT DETECTED NOT DETECTED Final   Cryptococcus neoformans/gattii NOT DETECTED NOT DETECTED Final   Meth resistant mecA/C and MREJ NOT DETECTED NOT DETECTED Final    Comment: Performed at [facility], [address], [place], [place] [address]  Blood culture (routine x 2)      Status: None (Preliminary result)   Collection Time: 10/25/19  6:30 AM   Specimen: BLOOD RIGHT HAND  Result Value Ref Range Status   Specimen Description BLOOD RIGHT HAND  Final   Special Requests   Final    BOTTLES DRAWN AEROBIC AND ANAEROBIC Blood Culture adequate volume   Culture   Final    NO GROWTH 2 DAYS Performed at [facility], [address] [place], [place] [address]    Report Status PENDING  Incomplete         Radiology Studies: No results found.  Scheduled Meds: . buprenorphine-naloxone  1 tablet Sublingual BID  . diazepam  25 mg Oral TID  . gabapentin  400 mg Oral TID  . hepatitis A virus (PF) vaccine  1 mL Intramuscular Once  . hepatitis b vaccine for adults  1 mL Intramuscular Once  . pantoprazole  40 mg Oral BID  . sodium chloride flush  3 mL Intravenous Q12H   Continuous Infusions: . sodium chloride 250 mL (10/26/19 2240)  .  ceFAZolin (ANCEF) IV 2 g (10/28/19 0941)     LOS: 3 days    Time spent:25 mins.    Cipriano Bunker, MD [facility]   If 7PM-7AM, please contact night-coverage

## 2019-10-28 NOTE — Progress Notes (Signed)
Patient brought down to endo unit for TEE, after speaking with MD Turner he is refusing to do the procedure. Taken back to unit.

## 2019-10-29 LAB — BASIC METABOLIC PANEL
Anion gap: 11 (ref 5–15)
BUN: 13 mg/dL (ref 6–20)
CO2: 24 mmol/L (ref 22–32)
Calcium: 9 mg/dL (ref 8.9–10.3)
Chloride: 106 mmol/L (ref 98–111)
Creatinine, Ser: 0.72 mg/dL (ref 0.61–1.24)
GFR calc Af Amer: >60 mL/min (ref >60)
GFR calc non Af Amer: >60 mL/min (ref >60)
Glucose, Bld: 89 mg/dL (ref 70–99)
Potassium: 4.3 mmol/L (ref 3.5–5.1)
Sodium: 141 mmol/L (ref 135–145)

## 2019-10-29 MED ORDER — DIPHENHYDRAMINE HCL 50 MG/ML IJ SOLN
25.0000 mg | Freq: Once | INTRAMUSCULAR | Status: AC
  Administered 2019-10-29: 25 mg via INTRAVENOUS
  Filled 2019-10-29: qty 1

## 2019-10-29 MED ORDER — DIAZEPAM 5 MG PO TABS: 25.0000 mg | ORAL_TABLET | Freq: Three times a day (TID) | ORAL | 0 refills | Status: AC

## 2019-10-29 MED ORDER — ORITAVANCIN DIPHOSPHATE 400 MG IV SOLR
1200.0000 mg | Freq: Once | INTRAVENOUS | Status: AC
  Administered 2019-10-29: 1200 mg via INTRAVENOUS
  Filled 2019-10-29: qty 120

## 2019-10-29 MED ORDER — LINEZOLID 600 MG PO TABS: 600.0000 mg | ORAL_TABLET | Freq: Two times a day (BID) | ORAL | 0 refills | Status: AC

## 2019-10-29 MED FILL — LINEZOLID 600 MG TABS: 600 | 28 days supply | Qty: 56 | Fill #0

## 2019-10-29 MED FILL — diazePAM 5 MG TABS: 5 | 3 days supply | Qty: 45 | Fill #0

## 2019-10-29 NOTE — TOC Transition Note (Signed)
Transition of Care [facility]) - CM/SW Discharge Note   Patient Details  Name: Timothy Christensen MRN: [id number] Date of Birth: 01-20-1996  [facility] CM/SW Contact:  Leone Haven, RN Phone Number: 10/29/2019, 12:49 PM   Clinical Narrative:    Patient is for dc today, he will receive oritavacin x 1 by ID then will do po abx, TOC filling meds for patient ,will bring to room prior to discharge.     Final next level of care: Home/Self Care Barriers to Discharge: No Barriers Identified   Patient Goals and CMS Choice        Discharge Placement                       Discharge Plan and Services                  DME Agency: NA       HH Arranged: NA          Social Determinants of Health (SDOH) Interventions     Readmission Risk Interventions Readmission Risk Prevention Plan 08/07/2019  Medication Screening Complete  Transportation Screening Complete  Some recent data might be hidden

## 2019-10-29 NOTE — Discharge Summary (Signed)
Physician Discharge Summary  Timothy Christensen PFY:[id number] DOB: 05-22-1995 DOA: 10/24/2019  PCP: Patient, No Pcp Per  Admit date: 10/24/2019   Discharge date: 10/29/2019  Admitted From:  Home. Disposition: Home  Recommendations for Outpatient Follow-up:  1. Follow up with PCP in 1-2 weeks. 2. Please obtain BMP/CBC in one week. 3.   Advised to follow-up with infectious disease doctor in 2 weeks. 4.   Patient has been discharged on linezolid 2 times a day for 6 weeks. 5.   Patient has received 1/2 dose of oritavancin in the hospital and has developed allergic reaction, infusion discontinued. 6.   Advised to follow-up with substance abuse clinic in [place]. 7.   Patient has been prescribed  diazepam 25 mg 3 times daily for 3 days until he follows up with a substance abuse clinic.   Home Health: NONE.  Equipment/Devices: None.  Discharge Condition: Stable.  CODE STATUS:Full code Diet recommendation: Heart Healthy   Brief Summary / Hospital course: 24 years old male with PMH significant for polysubstance IV drug abuse, History of partially or untreated infective endocarditis( stated he was treated with oral antibiotics) .  He was doing fine until 6 weeks ago when he started having this progressive shortness of breath.  He is found to have positive blood cultures for MSSA , Echocardiogram shows reduced EF 40-45% but no vegetation.  Cardiology was consulted to do TEE.  Patient was vancomycin and ceftriaxone transitioned to Ancef. Patient has been very demanding with regards to polysubstance abuse, He is taking very high doses of Valium 25 mg 3 times daily and Sub-oxone thrice dialy.  There is a documentation dated he has left AGAINST MEDICAL ADVICE from [facility].  Patient refused to have TEE, procedure cancelled. ID recommended 2 weeks of IV Ancef followed by linezolid for 4 weeks. (Total duration 6 weeks of antibiotics).  Patient agreed to stay 2 weeks in the hospital to receive IV Ancef.  Patient was  going out of the hospital and coming back.  Patient was explained it is against hospital policy.  Patient has decided to leave.  Infectious disease was consulted.  They recommended oritavancin x1 that will cover him for 2 weeks and he will take 4 weeks of linezolid and follow-up with ID.  Halfway through the infusion patient has developed allergic reaction,  developed wheals around his whole body.  Infusion was discontinued.  Infectious disease was notified,  Patient is being discharged home on 6 weeks of linezolid.  Patient was explained to stay in the hospital for IV antibiotics but he declined.  Patient will follow up with substance abuse clinic at [facility] for Suboxone.  Patient was given prescription for diazepam for 3 days.  He was managed for below problems.     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 Admitted for suspected infective endocarditis. Started on vancomycin and ceftriaxone. Blood cultures grew MSSA antibiotic transition to Ancef. ID consult appreciated. Echocardiogram shows EF 40 to 45%.  There is no vegetation. Cardiology consulted for TEE to rule out endocarditis. His Hb dropped from 12.3 >> 8.4, repeat 11.8. Patient refused to have TEE, procedure canceled. ID consulted recommended total 6 weeks of antibiotics. Patient agreed  to get IV Ancef for 2 weeks in hospital followed by 4 weeks of linezolid. Patient is being discharged on 6 weeks of p.o. linezolid.   Chronic systolic heart failure.: Patient does not appear to be in decompensated heart failure,  lungs are clear,he denies shortness of breath  or DOE. Cardiology consulted, awaiting recommendation. TTE: LVEF 40-45% consistent with systolic heart failure. TEE scheduled for today but cancelled, Patient refused to have TEE.  Polysubstance abuse Medicine reconciliation shows Suboxone and very high doses of Valium Will continue Suboxone Patient is placed on his Valium 25 mg 3 times daily confirmed with [facility] pharmacy.   Discharge Instructions  Discharge Instructions    Call MD for:  difficulty breathing, headache or visual disturbances   Complete by: As directed    Call MD for:  persistant dizziness or light-headedness   Complete by: As directed    Call MD for:  severe uncontrolled pain   Complete by: As directed    Call MD for:  temperature >100.4   Complete by: As directed    Diet - low sodium heart healthy   Complete by: As directed    Diet Carb Modified   Complete by: As directed    Discharge instructions   Complete by: As directed    Advised to follow-up with primary care physician in 1 week. Advised to follow-up with infectious disease doctor in 2 weeks. Advised to follow-up with substance abuse clinic in [place]. Patient has been diazepam 25 mg 3 times daily for 3 days until he follows up with a substance abuse clinic.   Increase activity slowly   Complete by: As directed      Allergies as of 10/29/2019   No Known Allergies     Medication List    TAKE these medications   buprenorphine-naloxone 8-2 mg Subl SL tablet Commonly known as: SUBOXONE Place 1 tablet under the tongue 3 (three) times daily.   diazepam 5 MG tablet Commonly known as: VALIUM Take 5 tablets (25 mg total) by mouth 3 (three) times daily. What changed:   medication strength  when to take this   gabapentin 400 MG capsule Commonly known as: NEURONTIN Take 400 mg by mouth 3 (three) times daily.   ibuprofen 800 MG tablet Commonly known as: ADVIL Take 1 tablet (800 mg total) by mouth 3 (three) times daily.   linezolid 600 MG  tablet Commonly known as: ZYVOX Take 1 tablet (600 mg total) by mouth 2 (two) times daily for 28 days. Start taking on 11/05/2019 Start taking on: November 05, 2019   naloxone 4 MG/0.1ML Liqd nasal spray kit Commonly known as: Engineer, materials 1 spray into the nose once as needed (opiod overdose).       Follow-up [place] for Infectious Disease On 12/02/2019.   Specialty: Infectious Diseases Why: 3:00 pm telephone visit with Colletta [place]. Please be available 15 minutes prior to your scheduled appointment  Contact information: [place], [place] [id number] [place] [phone]             No Known Allergies  Consultations:  Infectious Diseases.   Procedures/Studies: DG Chest 2 View  Result Date: 10/25/2019 CLINICAL DATA:  Endocarditis. Weakness. Pain in both legs. Fever and neck pain. EXAM: CHEST - 2 VIEW COMPARISON:  08/05/2019 FINDINGS: The heart size and mediastinal contours are within normal limits. Both lungs are clear. The visualized skeletal structures are unremarkable. IMPRESSION: No active cardiopulmonary disease. Electronically  Signed   By: Lucienne Capers M.D.   On: 10/25/2019 04:10   DG Lumbar Spine Complete  Result Date: 10/25/2019 CLINICAL DATA:  Endocarditis. Weakness and pain in both legs. Fever and neck pain. EXAM: LUMBAR SPINE - COMPLETE 4+ VIEW COMPARISON:  None. FINDINGS: Five lumbar type vertebral bodies. Normal alignment. No compression deformities. No focal bone lesion or bone destruction. Disc space heights are preserved. Visualized sacrum appears intact. IMPRESSION: Negative. Electronically Signed   By: Lucienne Capers M.D.   On: 10/25/2019 05:24   ECHOCARDIOGRAM COMPLETE  Result Date: 10/25/2019    ECHOCARDIOGRAM REPORT   Patient Name:   MOMEN HAM Date of Exam: 10/25/2019 Medical Rec #:  [id number]   Height:       72.0 in Accession #:    [id number]  Weight:       171.7 lb Date  of Birth:  1995-04-09    BSA:          1.997 m Patient Age:    24 years    BP:           71/67 mmHg Patient Gender: M           HR:           116 bpm. Exam Location:  Inpatient Procedure: 2D Echo, Cardiac Doppler and Color Doppler Indications:    Endocarditis  History:        Patient has prior history of Echocardiogram examinations, most                 recent 08/06/2019. Risk Factors:Current Smoker. Polysubstance                 abuse.  Sonographer:    Clayton Lefort RDCS (AE) Referring Phys: [id number] [place]  Sonographer Comments: Image acquisition challenging due to respiratory motion and patient movement. IMPRESSIONS  1. Suggest TEE for further assessment of his valves.  2. Left ventricular ejection fraction, by estimation, is 45 to 50%. The left ventricle has mildly decreased function. The left ventricle demonstrates global hypokinesis. Left ventricular diastolic parameters were normal.  3. Right ventricular systolic function is normal. The right ventricular size is mildly enlarged.  4. Right atrial size was mildly dilated.  5. The MR is eccentric and may be more severe than what it appears. The valve is thickened and I cannot rule out vegetation on the mitral valve. Suggest TEE for further assessment of his valves.     . The mitral valve is abnormal. Moderate to severe mitral valve regurgitation.  6. There appears to be a vegetation attached to the tricupsid valve.     . The tricuspid valve is abnormal. Tricuspid valve regurgitation is severe.  7. The aortic valve is normal in structure. Aortic valve regurgitation is not visualized. FINDINGS  Left Ventricle: Left ventricular ejection fraction, by estimation, is 45 to 50%. The left ventricle has mildly decreased function. The left ventricle demonstrates global hypokinesis. The left ventricular internal cavity size was normal in size. There is  no left ventricular hypertrophy. Left ventricular diastolic parameters were normal. Right Ventricle: The  right ventricular size is mildly enlarged. Right vetricular wall thickness was not well visualized. Right ventricular systolic function is normal. Left Atrium: Left atrial size was normal in size. Right Atrium: Right atrial size was mildly dilated. Pericardium: There is no evidence of pericardial effusion. Mitral Valve: The MR is eccentric and may be more severe than what it appears. The valve is thickened and I cannot  rule out vegetation on the mitral valve. Suggest TEE for further assessment of his valves. The mitral valve is abnormal. There is moderate thickening of the mitral valve leaflet(s). Moderate to severe mitral valve regurgitation. Tricuspid Valve: There appears to be a vegetation attached to the tricupsid valve. The tricuspid valve is abnormal. Tricuspid valve regurgitation is severe. Aortic Valve: The aortic valve is normal in structure. Aortic valve regurgitation is not visualized. Aortic valve mean gradient measures 26.7 mmHg. Aortic valve peak gradient measures 49.5 mmHg. Aortic valve area, by VTI measures 0.86 cm. Pulmonic Valve: The pulmonic valve was normal in structure. Pulmonic valve regurgitation is not visualized. Aorta: The aortic root and ascending aorta are structurally normal, with no evidence of dilitation. IAS/Shunts: The atrial septum is grossly normal. Additional Comments: Suggest TEE for further assessment of his valves.  LEFT VENTRICLE PLAX 2D LVIDd:         5.80 cm LVIDs:         4.10 cm LV PW:         1.00 cm LV IVS:        1.20 cm LVOT diam:     2.00 cm LV SV:         57 LV SV Index:   28 LVOT Area:     3.14 cm  RIGHT VENTRICLE             IVC RV Basal diam:  3.20 cm     IVC diam: 2.30 cm RV S prime:     12.80 cm/s TAPSE (M-mode): 2.6 cm LEFT ATRIUM             Index       RIGHT ATRIUM           Index LA diam:        3.50 cm 1.75 cm/m  RA Area:     19.20 cm LA Vol (A2C):   73.8 ml 36.96 ml/m RA Volume:   52.20 ml  26.14 ml/m LA Vol (A4C):   68.6 ml 34.35 ml/m LA Biplane  Vol: 75.2 ml 37.66 ml/m  AORTIC VALVE AV Area (Vmax):    0.73 cm AV Area (Vmean):   0.82 cm AV Area (VTI):     0.86 cm AV Vmax:           351.67 cm/s AV Vmean:          233.333 cm/s AV VTI:            0.657 m AV Peak Grad:      49.5 mmHg AV Mean Grad:      26.7 mmHg LVOT Vmax:         82.20 cm/s LVOT Vmean:        61.200 cm/s LVOT VTI:          0.180 m LVOT/AV VTI ratio: 0.27  AORTA Ao Root diam: 3.80 cm Ao Asc diam:  3.20 cm MITRAL VALVE MV Area (PHT): 4.06 cm    SHUNTS MV Decel Time: 187 msec    Systemic VTI:  0.18 m MV E velocity: 99.40 cm/s  Systemic Diam: 2.00 cm MV A velocity: 49.30 cm/s MV E/A ratio:  2.02 Mertie Moores MD Electronically signed by Mertie Moores MD Signature Date/Time: 10/25/2019/5:04:40 PM    Final     Echocardiogram.   Subjective: Patient was seen and examined at bedside.  Overnight events noted.  Patient has been frequently in and out of the hospital.   Infectious disease has cleared the patient  to be discharged home.  Patient states he cannot stay in the hospital for 2 weeks.  Discharge Exam: Vitals:   10/28/19 1000 10/28/19 1148  BP: 108/64 116/64  Pulse: 85 84  Resp:  18  Temp:  98 F (36.7 C)  SpO2:  100%   Vitals:   10/27/19 1636 10/28/19 0729 10/28/19 1000 10/28/19 1148  BP: 105/64 109/60 108/64 116/64  Pulse: 81 84 85 84  Resp: '18 11  18  ' Temp: 98.7 F (37.1 C) (!) 97.5 F (36.4 C)  98 F (36.7 C)  TempSrc: Oral Oral  Oral  SpO2: 98% 99%  100%  Weight:      Height:        General: Pt is alert, awake, not in acute distress Cardiovascular: RRR, S1/S2 +, no rubs, no gallops Respiratory: CTA bilaterally, no wheezing, no rhonchi Abdominal: Soft, NT, ND, bowel sounds + Extremities: no edema, no cyanosis    The results of significant diagnostics from this hospitalization (including imaging, microbiology, ancillary and laboratory) are listed below for reference.     Microbiology: Recent Results (from the past 240 hour(s))  Urine culture      Status: None   Collection Time: 10/25/19  3:35 AM   Specimen: Urine, Catheterized  Result Value Ref Range Status   Specimen Description URINE, CATHETERIZED  Final   Special Requests Normal  Final   Culture   Final    NO GROWTH Performed at [facility], [address], [place], [place] [address]    Report Status 10/26/2019 FINAL  Final  Blood culture (routine x 2)     Status: Abnormal   Collection Time: 10/25/19  4:27 AM   Specimen: BLOOD  Result Value Ref Range Status   Specimen Description BLOOD LEFT ANTECUBITAL  Final   Special Requests   Final    BOTTLES DRAWN AEROBIC AND ANAEROBIC Blood Culture adequate volume   Culture  Setup Time   Final    GRAM POSITIVE COCCI IN CLUSTERS IN BOTH AEROBIC AND ANAEROBIC BOTTLES CRITICAL RESULT CALLED TO, READ BACK BY AND VERIFIED WITH: H,VONDOHLEN PHARMD '@2145'  10/25/19 EB Performed at [facility], [place] [address] [place], [place] [address]    Culture STAPHYLOCOCCUS AUREUS (A)  Final   Report Status 10/27/2019 FINAL  Final   Organism ID, Bacteria STAPHYLOCOCCUS AUREUS  Final      Susceptibility   Staphylococcus aureus - MIC*    CIPROFLOXACIN <=0.5 SENSITIVE Sensitive     ERYTHROMYCIN RESISTANT Resistant     GENTAMICIN <=0.5 SENSITIVE Sensitive     OXACILLIN 0.5 SENSITIVE Sensitive     TETRACYCLINE <=1 SENSITIVE Sensitive     VANCOMYCIN <=0.5 SENSITIVE Sensitive     TRIMETH/SULFA <=10 SENSITIVE Sensitive     CLINDAMYCIN RESISTANT Resistant     RIFAMPIN <=0.5 SENSITIVE Sensitive     Inducible Clindamycin POSITIVE Resistant     * STAPHYLOCOCCUS AUREUS  SARS Coronavirus 2 by RT PCR (hospital order, performed in [facility] hospital lab) Nasopharyngeal Nasopharyngeal Swab     Status: None   Collection Time: 10/25/19  4:27 AM   Specimen: Nasopharyngeal Swab  Result Value Ref Range Status   SARS Coronavirus 2 NEGATIVE NEGATIVE Final    Comment: (NOTE) SARS-CoV-2 target nucleic acids are NOT DETECTED.  The SARS-CoV-2 RNA is  generally detectable in upper and lower respiratory specimens during the acute phase of infection. The lowest concentration of SARS-CoV-2 viral copies this assay can detect is 250 copies / mL.  A negative result does not preclude SARS-CoV-2 infection and should not be used as the sole basis for treatment or other patient management decisions.  A negative result may occur with improper specimen collection / handling, submission of specimen other than nasopharyngeal swab, presence of viral mutation(s) within the areas targeted by this assay, and inadequate number of viral copies (<250 copies / mL). A negative result must be combined with clinical observations, patient history, and epidemiological information.  Fact Sheet for Patients:   StrictlyIdeas.no  Fact Sheet for Healthcare Providers: BankingDealers.co.za  This test is not yet approved or  cleared by the [place] FDA and has been authorized for detection and/or diagnosis of SARS-CoV-2 by FDA under an Emergency Use Authorization (EUA).  This EUA will remain in effect (meaning this test can be used) for the duration of the COVID-19 declaration under Section 564(b)(1) of the Act, 21 U.S.C. section 360bbb-3(b)(1), unless the authorization is terminated or revoked sooner.  Performed at [facility], [place] [address], [place], [place] [address]   Blood Culture ID Panel (Reflexed)     Status: Abnormal   Collection Time: 10/25/19  4:27 AM  Result Value Ref Range Status   Enterococcus faecalis NOT DETECTED NOT DETECTED Final   Enterococcus Faecium NOT DETECTED NOT DETECTED Final   Listeria monocytogenes NOT DETECTED NOT DETECTED Final   Staphylococcus species DETECTED (A) NOT DETECTED Final    Comment: CRITICAL RESULT CALLED TO, READ BACK BY AND VERIFIED WITH: H,VONDOHLEN PHARMD '@2145'  10/25/19 EB    Staphylococcus aureus (BCID) DETECTED (A) NOT DETECTED Final    Comment: CRITICAL  RESULT CALLED TO, READ BACK BY AND VERIFIED WITH: H,VONDOHLEN PHARMD '@2145'  10/25/19 EB    Staphylococcus epidermidis NOT DETECTED NOT DETECTED Final   Staphylococcus lugdunensis NOT DETECTED NOT DETECTED Final   Streptococcus species NOT DETECTED NOT DETECTED Final   Streptococcus agalactiae NOT DETECTED NOT DETECTED Final   Streptococcus pneumoniae NOT DETECTED NOT DETECTED Final   Streptococcus pyogenes NOT DETECTED NOT DETECTED Final   A.calcoaceticus-baumannii NOT DETECTED NOT DETECTED Final   Bacteroides fragilis NOT DETECTED NOT DETECTED Final   Enterobacterales NOT DETECTED NOT DETECTED Final   Enterobacter cloacae complex NOT DETECTED NOT DETECTED Final   Escherichia coli NOT DETECTED NOT DETECTED Final   Klebsiella aerogenes NOT DETECTED NOT DETECTED Final   Klebsiella oxytoca NOT DETECTED NOT DETECTED Final   Klebsiella pneumoniae NOT DETECTED NOT DETECTED Final   Proteus species NOT DETECTED NOT DETECTED Final   Salmonella species NOT DETECTED NOT DETECTED Final   Serratia marcescens NOT DETECTED NOT DETECTED Final   Haemophilus influenzae NOT DETECTED NOT DETECTED Final   Neisseria meningitidis NOT DETECTED NOT DETECTED Final   Pseudomonas aeruginosa NOT DETECTED NOT DETECTED Final   Stenotrophomonas maltophilia NOT DETECTED NOT DETECTED Final   Candida albicans NOT DETECTED NOT DETECTED Final   Candida auris NOT DETECTED NOT DETECTED Final   Candida glabrata NOT DETECTED NOT DETECTED Final   Candida krusei NOT DETECTED NOT DETECTED Final   Candida parapsilosis NOT DETECTED NOT DETECTED Final   Candida tropicalis NOT DETECTED NOT DETECTED Final   Cryptococcus neoformans/gattii NOT DETECTED NOT DETECTED Final   Meth resistant mecA/C and MREJ NOT DETECTED NOT DETECTED Final    Comment: Performed at [facility], [address] [place], [place] [address]  Blood culture (routine x 2)     Status: None (Preliminary result)   Collection Time: 10/25/19  6:30 AM    Specimen: BLOOD RIGHT  HAND  Result Value Ref Range Status   Specimen Description BLOOD RIGHT HAND  Final   Special Requests   Final    BOTTLES DRAWN AEROBIC AND ANAEROBIC Blood Culture adequate volume   Culture   Final    NO GROWTH 4 DAYS Performed at [facility], [address] [place], [place] [address]    Report Status PENDING  Incomplete     Labs: BNP (last 3 results) No results for input(s): BNP in the last 8760 hours. Basic Metabolic Panel: Recent Labs  Lab 10/24/19 1452 10/25/19 0630 10/26/19 0649 10/27/19 0500 10/29/19 0832  NA 139 139 142 140 141  K 4.0 4.0 4.2 3.9 4.3  CL 103 107 106 105 106  CO2 '23 25 26 25 24  ' GLUCOSE 131* 90 111* 87 89  BUN '10 11 12 ' 25* 13  CREATININE 1.32* 0.77 0.81 1.46* 0.72  CALCIUM 9.0 8.5* 8.9 9.0 9.0  MG  --   --   --  1.8  --   PHOS  --   --   --  3.4  --    Liver Function Tests: Recent Labs  Lab 10/25/19 0630 10/26/19 0649  AST 48* 20  ALT 48* 32  ALKPHOS 63 73  BILITOT 0.6 0.4  PROT 5.7* 5.5*  ALBUMIN 3.4* 3.1*   No results for input(s): LIPASE, AMYLASE in the last 168 hours. No results for input(s): AMMONIA in the last 168 hours. CBC: Recent Labs  Lab 10/24/19 1452 10/25/19 1701 10/26/19 0649 10/27/19 0500 10/27/19 1942  WBC 15.5* 9.4 5.6 15.2*  --   NEUTROABS  --  8.4*  --   --   --   HGB 13.5 13.3 12.3* 8.4* 11.8*  HCT 41.9 42.0 38.8* 28.4* 37.0*  MCV 87.5 88.4 85.7 94.4  --   PLT 209 220 188 360  --    Cardiac Enzymes: Recent Labs  Lab 10/25/19 0427  CKTOTAL 37*   BNP: Invalid input(s): POCBNP CBG: No results for input(s): GLUCAP in the last 168 hours. D-Dimer No results for input(s): DDIMER in the last 72 hours. Hgb A1c No results for input(s): HGBA1C in the last 72 hours. Lipid Profile No results for input(s): CHOL, HDL, LDLCALC, TRIG, CHOLHDL, LDLDIRECT in the last 72 hours. Thyroid function studies No results for input(s): TSH, T4TOTAL, T3FREE, THYROIDAB in the last 72  hours.  Invalid input(s): FREET3 Anemia work up No results for input(s): VITAMINB12, FOLATE, FERRITIN, TIBC, IRON, RETICCTPCT in the last 72 hours. Urinalysis    Component Value Date/Time   COLORURINE YELLOW 10/25/2019 1308   APPEARANCEUR CLEAR 10/25/2019 1308   LABSPEC 1.012 10/25/2019 1308   PHURINE 7.0 10/25/2019 1308   GLUCOSEU NEGATIVE 10/25/2019 1308   HGBUR NEGATIVE 10/25/2019 1308   [place] 10/25/2019 1308   KETONESUR NEGATIVE 10/25/2019 1308   PROTEINUR NEGATIVE 10/25/2019 1308   NITRITE NEGATIVE 10/25/2019 1308   LEUKOCYTESUR NEGATIVE 10/25/2019 1308   Sepsis Labs Invalid input(s): PROCALCITONIN,  WBC,  LACTICIDVEN Microbiology Recent Results (from the past 240 hour(s))  Urine culture     Status: None   Collection Time: 10/25/19  3:35 AM   Specimen: Urine, Catheterized  Result Value Ref Range Status   Specimen Description URINE, CATHETERIZED  Final   Special Requests Normal  Final   Culture   Final    NO GROWTH Performed at [facility], [place] [address], [place], [place] [address]    Report Status 10/26/2019 FINAL  Final  Blood culture (routine x 2)     Status: Abnormal   Collection Time: 10/25/19  4:27 AM   Specimen: BLOOD  Result Value Ref Range Status   Specimen Description BLOOD LEFT ANTECUBITAL  Final   Special Requests   Final    BOTTLES DRAWN AEROBIC AND ANAEROBIC Blood Culture adequate volume   Culture  Setup Time   Final    GRAM POSITIVE COCCI IN CLUSTERS IN BOTH AEROBIC AND ANAEROBIC BOTTLES CRITICAL RESULT CALLED TO, READ BACK BY AND VERIFIED WITH: H,VONDOHLEN PHARMD '@2145'  10/25/19 EB Performed at [facility], [place] [address], [place], [place] [address]    Culture STAPHYLOCOCCUS AUREUS (A)  Final   Report Status 10/27/2019 FINAL  Final   Organism ID, Bacteria STAPHYLOCOCCUS AUREUS  Final      Susceptibility   Staphylococcus aureus - MIC*    CIPROFLOXACIN <=0.5 SENSITIVE Sensitive     ERYTHROMYCIN RESISTANT Resistant      GENTAMICIN <=0.5 SENSITIVE Sensitive     OXACILLIN 0.5 SENSITIVE Sensitive     TETRACYCLINE <=1 SENSITIVE Sensitive     VANCOMYCIN <=0.5 SENSITIVE Sensitive     TRIMETH/SULFA <=10 SENSITIVE Sensitive     CLINDAMYCIN RESISTANT Resistant     RIFAMPIN <=0.5 SENSITIVE Sensitive     Inducible Clindamycin POSITIVE Resistant     * STAPHYLOCOCCUS AUREUS  SARS Coronavirus 2 by RT PCR (hospital order, performed in [facility] hospital lab) Nasopharyngeal Nasopharyngeal Swab     Status: None   Collection Time: 10/25/19  4:27 AM   Specimen: Nasopharyngeal Swab  Result Value Ref Range Status   SARS Coronavirus 2 NEGATIVE NEGATIVE Final    Comment: (NOTE) SARS-CoV-2 target nucleic acids are NOT DETECTED.  The SARS-CoV-2 RNA is generally detectable in upper and lower respiratory specimens during the acute phase of infection. The lowest concentration of SARS-CoV-2 viral copies this assay can detect is 250 copies / mL. A negative result does not preclude SARS-CoV-2 infection and should not be used as the sole basis for treatment or other patient management decisions.  A negative result may occur with improper specimen collection / handling, submission of specimen other than nasopharyngeal swab, presence of viral mutation(s) within the areas targeted by this assay, and inadequate number of viral copies (<250 copies / mL). A negative result must be combined with clinical observations, patient history, and epidemiological information.  Fact Sheet for Patients:   StrictlyIdeas.no  Fact Sheet for Healthcare Providers: BankingDealers.co.za  This test is not yet approved or  cleared by the [place] FDA and has been authorized for detection and/or diagnosis of SARS-CoV-2 by FDA under an Emergency Use Authorization (EUA).  This EUA will remain in effect (meaning this test can be used) for the duration of the COVID-19 declaration under Section 564(b)(1)  of the Act, 21 U.S.C. section 360bbb-3(b)(1), unless the authorization is terminated or revoked sooner.  Performed at [facility], [place] [address] [place], [place] [address]   Blood Culture ID Panel (Reflexed)     Status: Abnormal   Collection Time: 10/25/19  4:27 AM  Result Value Ref Range Status   Enterococcus faecalis NOT DETECTED NOT DETECTED Final   Enterococcus Faecium NOT DETECTED NOT DETECTED Final   Listeria monocytogenes NOT DETECTED NOT DETECTED Final   Staphylococcus species DETECTED (A) NOT DETECTED Final    Comment: CRITICAL RESULT CALLED TO, READ BACK BY AND VERIFIED WITH: H,VONDOHLEN PHARMD '@2145'  10/25/19 EB    Staphylococcus aureus (BCID) DETECTED (A) NOT DETECTED  Final    Comment: CRITICAL RESULT CALLED TO, READ BACK BY AND VERIFIED WITH: H,VONDOHLEN PHARMD '@2145'  10/25/19 EB    Staphylococcus epidermidis NOT DETECTED NOT DETECTED Final   Staphylococcus lugdunensis NOT DETECTED NOT DETECTED Final   Streptococcus species NOT DETECTED NOT DETECTED Final   Streptococcus agalactiae NOT DETECTED NOT DETECTED Final   Streptococcus pneumoniae NOT DETECTED NOT DETECTED Final   Streptococcus pyogenes NOT DETECTED NOT DETECTED Final   A.calcoaceticus-baumannii NOT DETECTED NOT DETECTED Final   Bacteroides fragilis NOT DETECTED NOT DETECTED Final   Enterobacterales NOT DETECTED NOT DETECTED Final   Enterobacter cloacae complex NOT DETECTED NOT DETECTED Final   Escherichia coli NOT DETECTED NOT DETECTED Final   Klebsiella aerogenes NOT DETECTED NOT DETECTED Final   Klebsiella oxytoca NOT DETECTED NOT DETECTED Final   Klebsiella pneumoniae NOT DETECTED NOT DETECTED Final   Proteus species NOT DETECTED NOT DETECTED Final   Salmonella species NOT DETECTED NOT DETECTED Final   Serratia marcescens NOT DETECTED NOT DETECTED Final   Haemophilus influenzae NOT DETECTED NOT DETECTED Final   Neisseria meningitidis NOT DETECTED NOT DETECTED Final   Pseudomonas aeruginosa NOT  DETECTED NOT DETECTED Final   Stenotrophomonas maltophilia NOT DETECTED NOT DETECTED Final   Candida albicans NOT DETECTED NOT DETECTED Final   Candida auris NOT DETECTED NOT DETECTED Final   Candida glabrata NOT DETECTED NOT DETECTED Final   Candida krusei NOT DETECTED NOT DETECTED Final   Candida parapsilosis NOT DETECTED NOT DETECTED Final   Candida tropicalis NOT DETECTED NOT DETECTED Final   Cryptococcus neoformans/gattii NOT DETECTED NOT DETECTED Final   Meth resistant mecA/C and MREJ NOT DETECTED NOT DETECTED Final    Comment: Performed at [facility], [address], [place], [place] [address]  Blood culture (routine x 2)     Status: None (Preliminary result)   Collection Time: 10/25/19  6:30 AM   Specimen: BLOOD RIGHT HAND  Result Value Ref Range Status   Specimen Description BLOOD RIGHT HAND  Final   Special Requests   Final    BOTTLES DRAWN AEROBIC AND ANAEROBIC Blood Culture adequate volume   Culture   Final    NO GROWTH 4 DAYS Performed at [facility], [place] [address], [place], [place] [address]    Report Status PENDING  Incomplete     Time coordinating discharge: Over 30 minutes  SIGNED:   Shawna Clamp, MD  [facility] 10/29/2019, 12:38 PM Pager   If 7PM-7AM, please contact night-coverage [email or web address]

## 2019-10-29 NOTE — Progress Notes (Signed)
Patient stated he is itching all over. He has no rashes or welts at this time. Dr. Lucianne Muss made aware. No new orders received will continue to monitor

## 2019-10-29 NOTE — Progress Notes (Signed)
Called to the room to evaluate the patient for possible drug related reaction. About 90 minutes into Oritavancin infusion he developed itching, sense of unease and red-appearing welts over back and lower extremities along with swelling of his hands.   He was given 25 mg IV benadryl prior to coming in to evaluate him.  On exam he has some erythematous patches overlying legs without urticaria. Swelling of hands present but improving. No angioedema involving mouth/face. No wheezing or stridor. Breathing comfortably.   Hypersensitivity vs infusion flushing-syndrome (similar to red man's phenomenon). Could try to decrease the rate of the infusion to re-challenge but he is not willing. He has 4 weeks of linezolid ready and paid for. Will need to get him an additional 1-2 weeks of linezolid with patient assistance at outpatient follow up. I stressed multiple times that preferred therapy would be to continue IV inpatient. He will need a lab appointment at 3-4 weeks to check   He is argumentative and hyper-focused on suboxone Rx. I asked him to please consider staying until his appointment this Friday at the clinic - he will not if he cannot have his daughter visit.    Rexene Alberts, MSN, NP-C [facility]  [email or web address] Pager: [phone] Office: [phone] [facility] Main Line: [phone]

## 2019-10-29 NOTE — Discharge Instructions (Signed)
Advised to follow-up with primary care physician in 1 week. Advised to follow-up with infectious disease doctor in 2 weeks. Patient has been discharged on linezolid 2 times a day for 4 weeks. Patient has received 1 dose of oritavancin in the hospital that should cover him for 2 weeks. Advised to follow-up with substance abuse clinic in [place]. Patient has been diazepam 25 mg 3 times daily for 3 days until he follows up with a substance abuse clinic.

## 2019-10-29 NOTE — Progress Notes (Signed)
Patient still refusing to wear the telemetry monitor this morning. Requested some orange juice with breakfast and given. It was re-iterated that patients cannot leave the floor. Will continue to monitor.

## 2019-10-29 NOTE — Progress Notes (Signed)
Patient called RN into room, stating his hands were swollen. Noted hives over back, arms, and legs. Still itching severely. Dr. Lucianne Muss paged. IV benadryl order received. Paging infectious disease as well.

## 2019-10-29 NOTE — Progress Notes (Signed)
Case manager gave patient information on uber ride home. Stated he will get a text when the ride is here. He stated he didn't want a wheelchair downstairs and walked out with all belongings.

## 2019-10-29 NOTE — Progress Notes (Signed)
Pt requesting IV fluid restarted, explained that antibiotics is not due until 0200 am but pt insisted that IVF restarted, pt does not have any order for IV fluids, pt became irritable and is verbally abusive, pt hooked IV by himself and told this RN not to go back in his room. Charge nurse made aware and  assumed care for this patient.

## 2019-10-29 NOTE — Progress Notes (Signed)
Patient was refusing blood cultures to be drawn. Educated him that infectious disease doctors ordered these to ensure the infection was clearing and didn't need to change course of treatment. Patient now agreeable to let phlebotomist draw blood cultures.

## 2019-10-29 NOTE — Progress Notes (Signed)
Patient's hands less swollen, no itching since benadryl given. Discharge information given to patient and all questions answered.

## 2019-10-29 NOTE — Progress Notes (Signed)
Dr. Orvan Falconer returned page about reaction with antibiotic. Also made aware that patient states his hands feel like they are "about to bust." States he will come bedside to talk with the patient.

## 2019-10-29 NOTE — Progress Notes (Signed)
[facility]  Date of Admission:  10/24/2019      Total days of antibiotics 6  Cefazolin day 5           ASSESSMENT: Timothy Christensen informs me that he is not able to stay due to "things going on" and inability to leave the floor to smoke and see his children. Given this I suggested we arrange for a dose of Oritavancin before he leaves - discussed that the infusion will take 3 hours to administer. Will also ask TOC to prepare 4 weeks of linezolid for him to take after our follow up in 2 weeks via phone. Offered that he is welcome to come in person.   I asked him to please allow for lab to come up to repeat blood cultures before he leaves so we can ensure he is clearing his infection. If he is not will need to call him back for more IV antibiotics and TEE (which he still refuses to do today). Advised he will need cardiology follow up before consideration of any open heart surgery.    Will ask Dr. Lucianne Muss if he will be willing to supply a bridge of Suboxone until his follow up with outpatient provider. I asked him to proactively schedule an appointment today before he leaves.      PLAN: 1. Please ensure repeat blood cultures are drawn 2. Give Oritavancin x 1 dose today before he leaves - he understands it is a 3 hour infusion.  3. TOC pharmacy to prepare linezolid x 4 weeks.   Televisit follow up with outpatient ID on Monday 12/02/2019 @ 3:00 pm     Principal Problem:   MSSA bacteremia Active Problems:   IVDU (intravenous drug user)/Opiates/Cocaine   Polysubstance abuse /Cocaine and Opiates   Endocarditis of mitral valve   Suspected endocarditis   . buprenorphine-naloxone  1 tablet Sublingual TID  . diazepam  25 mg Oral TID  . gabapentin  400 mg Oral TID  . hepatitis A virus (PF) vaccine  1 mL Intramuscular Once  . hepatitis b vaccine for adults  1 mL Intramuscular Once  . pantoprazole  40 mg Oral BID  . sodium chloride flush  3 mL Intravenous Q12H      SUBJECTIVE: Wants to leave. Wants heart surgery soon. Upset he cannot see his children or leave the floor freely to smoke.  Needs suboxone bridge until he can meet with his outpatient provider.  He does feel a lot better than when he came in.    Review of Systems: Review of Systems  Constitutional: Negative for fever and malaise/fatigue.  Respiratory: Negative for cough and shortness of breath.   Cardiovascular: Negative for chest pain, orthopnea and leg swelling.  Gastrointestinal: Negative for abdominal pain, nausea and vomiting.  Skin: Negative for rash.  Neurological: Negative for headaches.    No Known Allergies  OBJECTIVE: Vitals:   10/27/19 1636 10/28/19 0729 10/28/19 1000 10/28/19 1148  BP: 105/64 109/60 108/64 116/64  Pulse: 81 84 85 84  Resp: 18 11  18   Temp: 98.7 F (37.1 C) (!) 97.5 F (36.4 C)  98 F (36.7 C)  TempSrc: Oral Oral  Oral  SpO2: 98% 99%  100%  Weight:      Height:       Body mass index is 21.13 kg/m.  Physical Exam Cardiovascular:     Rate and Rhythm: Normal rate and regular rhythm.     Heart  sounds: Murmur (3/6 holosystolic murmur ) heard.   Pulmonary:     Effort: Pulmonary effort is normal.     Breath sounds: Normal breath sounds.  Abdominal:     General: Bowel sounds are normal.     Palpations: Abdomen is soft.     Tenderness: There is no abdominal tenderness.  Skin:    General: Skin is warm and dry.     Capillary Refill: Capillary refill takes less than 2 seconds.  Neurological:     Mental Status: He is alert and oriented to person, place, and time.     Lab Results Lab Results  Component Value Date   WBC 15.2 (H) 10/27/2019   HGB 11.8 (L) 10/27/2019   HCT 37.0 (L) 10/27/2019   MCV 94.4 10/27/2019   PLT 360 10/27/2019    Lab Results  Component Value Date   CREATININE 1.46 (H) 10/27/2019   BUN 25 (H) 10/27/2019   NA 140 10/27/2019   K 3.9 10/27/2019   CL 105 10/27/2019   CO2 25 10/27/2019    Lab Results   Component Value Date   ALT 32 10/26/2019   AST 20 10/26/2019   ALKPHOS 73 10/26/2019   BILITOT 0.4 10/26/2019     Microbiology: Recent Results (from the past 240 hour(s))  Urine culture     Status: None   Collection Time: 10/25/19  3:35 AM   Specimen: Urine, Catheterized  Result Value Ref Range Status   Specimen Description URINE, CATHETERIZED  Final   Special Requests Normal  Final   Culture   Final    NO GROWTH Performed at [facility], [address] [place], [place] [address]    Report Status 10/26/2019 FINAL  Final  Blood culture (routine x 2)     Status: Abnormal   Collection Time: 10/25/19  4:27 AM   Specimen: BLOOD  Result Value Ref Range Status   Specimen Description BLOOD LEFT ANTECUBITAL  Final   Special Requests   Final    BOTTLES DRAWN AEROBIC AND ANAEROBIC Blood Culture adequate volume   Culture  Setup Time   Final    GRAM POSITIVE COCCI IN CLUSTERS IN BOTH AEROBIC AND ANAEROBIC BOTTLES CRITICAL RESULT CALLED TO, READ BACK BY AND VERIFIED WITH: H,VONDOHLEN PHARMD @2145  10/25/19 EB Performed at [facility], [address] [place], [place] [address]    Culture STAPHYLOCOCCUS AUREUS (A)  Final   Report Status 10/27/2019 FINAL  Final   Organism ID, Bacteria STAPHYLOCOCCUS AUREUS  Final      Susceptibility   Staphylococcus aureus - MIC*    CIPROFLOXACIN <=0.5 SENSITIVE Sensitive     ERYTHROMYCIN RESISTANT Resistant     GENTAMICIN <=0.5 SENSITIVE Sensitive     OXACILLIN 0.5 SENSITIVE Sensitive     TETRACYCLINE <=1 SENSITIVE Sensitive     VANCOMYCIN <=0.5 SENSITIVE Sensitive     TRIMETH/SULFA <=10 SENSITIVE Sensitive     CLINDAMYCIN RESISTANT Resistant     RIFAMPIN <=0.5 SENSITIVE Sensitive     Inducible Clindamycin POSITIVE Resistant     * STAPHYLOCOCCUS AUREUS  SARS Coronavirus 2 by RT PCR (hospital order, performed in [facility] hospital lab) Nasopharyngeal Nasopharyngeal Swab     Status: None   Collection Time: 10/25/19  4:27 AM    Specimen: Nasopharyngeal Swab  Result Value Ref Range Status   SARS Coronavirus 2 NEGATIVE NEGATIVE Final    Comment: (NOTE) SARS-CoV-2 target nucleic acids are NOT DETECTED.  The SARS-CoV-2 RNA is generally detectable  in upper and lower respiratory specimens during the acute phase of infection. The lowest concentration of SARS-CoV-2 viral copies this assay can detect is 250 copies / mL. A negative result does not preclude SARS-CoV-2 infection and should not be used as the sole basis for treatment or other patient management decisions.  A negative result may occur with improper specimen collection / handling, submission of specimen other than nasopharyngeal swab, presence of viral mutation(s) within the areas targeted by this assay, and inadequate number of viral copies (<250 copies / mL). A negative result must be combined with clinical observations, patient history, and epidemiological information.  Fact Sheet for Patients:   BoilerBrush.com.cy  Fact Sheet for Healthcare Providers: https://pope.com/  This test is not yet approved or  cleared by the [place] FDA and has been authorized for detection and/or diagnosis of SARS-CoV-2 by FDA under an Emergency Use Authorization (EUA).  This EUA will remain in effect (meaning this test can be used) for the duration of the COVID-19 declaration under Section 564(b)(1) of the Act, 21 U.S.C. section 360bbb-3(b)(1), unless the authorization is terminated or revoked sooner.  Performed at [facility], [address], [place], [place] [address]   Blood Culture ID Panel (Reflexed)     Status: Abnormal   Collection Time: 10/25/19  4:27 AM  Result Value Ref Range Status   Enterococcus faecalis NOT DETECTED NOT DETECTED Final   Enterococcus Faecium NOT DETECTED NOT DETECTED Final   Listeria monocytogenes NOT DETECTED NOT DETECTED Final   Staphylococcus species DETECTED (A) NOT  DETECTED Final    Comment: CRITICAL RESULT CALLED TO, READ BACK BY AND VERIFIED WITH: H,VONDOHLEN PHARMD @2145  10/25/19 EB    Staphylococcus aureus (BCID) DETECTED (A) NOT DETECTED Final    Comment: CRITICAL RESULT CALLED TO, READ BACK BY AND VERIFIED WITH: H,VONDOHLEN PHARMD @2145  10/25/19 EB    Staphylococcus epidermidis NOT DETECTED NOT DETECTED Final   Staphylococcus lugdunensis NOT DETECTED NOT DETECTED Final   Streptococcus species NOT DETECTED NOT DETECTED Final   Streptococcus agalactiae NOT DETECTED NOT DETECTED Final   Streptococcus pneumoniae NOT DETECTED NOT DETECTED Final   Streptococcus pyogenes NOT DETECTED NOT DETECTED Final   A.calcoaceticus-baumannii NOT DETECTED NOT DETECTED Final   Bacteroides fragilis NOT DETECTED NOT DETECTED Final   Enterobacterales NOT DETECTED NOT DETECTED Final   Enterobacter cloacae complex NOT DETECTED NOT DETECTED Final   Escherichia coli NOT DETECTED NOT DETECTED Final   Klebsiella aerogenes NOT DETECTED NOT DETECTED Final   Klebsiella oxytoca NOT DETECTED NOT DETECTED Final   Klebsiella pneumoniae NOT DETECTED NOT DETECTED Final   Proteus species NOT DETECTED NOT DETECTED Final   Salmonella species NOT DETECTED NOT DETECTED Final   Serratia marcescens NOT DETECTED NOT DETECTED Final   Haemophilus influenzae NOT DETECTED NOT DETECTED Final   Neisseria meningitidis NOT DETECTED NOT DETECTED Final   Pseudomonas aeruginosa NOT DETECTED NOT DETECTED Final   Stenotrophomonas maltophilia NOT DETECTED NOT DETECTED Final   Candida albicans NOT DETECTED NOT DETECTED Final   Candida auris NOT DETECTED NOT DETECTED Final   Candida glabrata NOT DETECTED NOT DETECTED Final   Candida krusei NOT DETECTED NOT DETECTED Final   Candida parapsilosis NOT DETECTED NOT DETECTED Final   Candida tropicalis NOT DETECTED NOT DETECTED Final   Cryptococcus neoformans/gattii NOT DETECTED NOT DETECTED Final   Meth resistant mecA/C and MREJ NOT DETECTED NOT DETECTED  Final    Comment: Performed at [facility], [address], [place], [address]  [address]  Blood culture (routine x 2)     Status: None (Preliminary result)   Collection Time: 10/25/19  6:30 AM   Specimen: BLOOD RIGHT HAND  Result Value Ref Range Status   Specimen Description BLOOD RIGHT HAND  Final   Special Requests   Final    BOTTLES DRAWN AEROBIC AND ANAEROBIC Blood Culture adequate volume   Culture   Final    NO GROWTH 4 DAYS Performed at [facility], [address] [place], [place] [address]    Report Status PENDING  Incomplete     Rexene Alberts, MSN, NP-C [facility]  [email or web address] Pager: [phone] Office: [phone] [facility] Main Line: [phone]

## 2019-10-30 LAB — CULTURE, BLOOD (ROUTINE X 2)
Culture: NO GROWTH
Special Requests: ADEQUATE

## 2019-11-03 LAB — CULTURE, BLOOD (ROUTINE X 2)
Culture: NO GROWTH
Culture: NO GROWTH
Special Requests: ADEQUATE
Special Requests: ADEQUATE

## 2019-12-02 ENCOUNTER — Other Ambulatory Visit: Payer: Self-pay

## 2019-12-02 ENCOUNTER — Telehealth: Payer: Self-pay

## 2019-12-02 ENCOUNTER — Telehealth: Payer: Self-pay | Admitting: Infectious Diseases

## 2019-12-02 NOTE — Telephone Encounter (Signed)
Called patient x2 to begin virtual visit that is scheduled at 3 with [place], Np. Left voicemail requesting call back before 315 to begin visit. Patient will need to reschedule appointment if no call is returned.  Lorenso Courier, [place]

## 2022-02-26 IMAGING — CR DG LUMBAR SPINE COMPLETE 4+V
5 series · 5 of 5 positions shown · non-contrast
Comparison: None.

CLINICAL DATA: Endocarditis. Weakness and pain in both legs. Fever
and neck pain.

EXAM:
LUMBAR SPINE - COMPLETE 4+ VIEW

[l-spine ap]
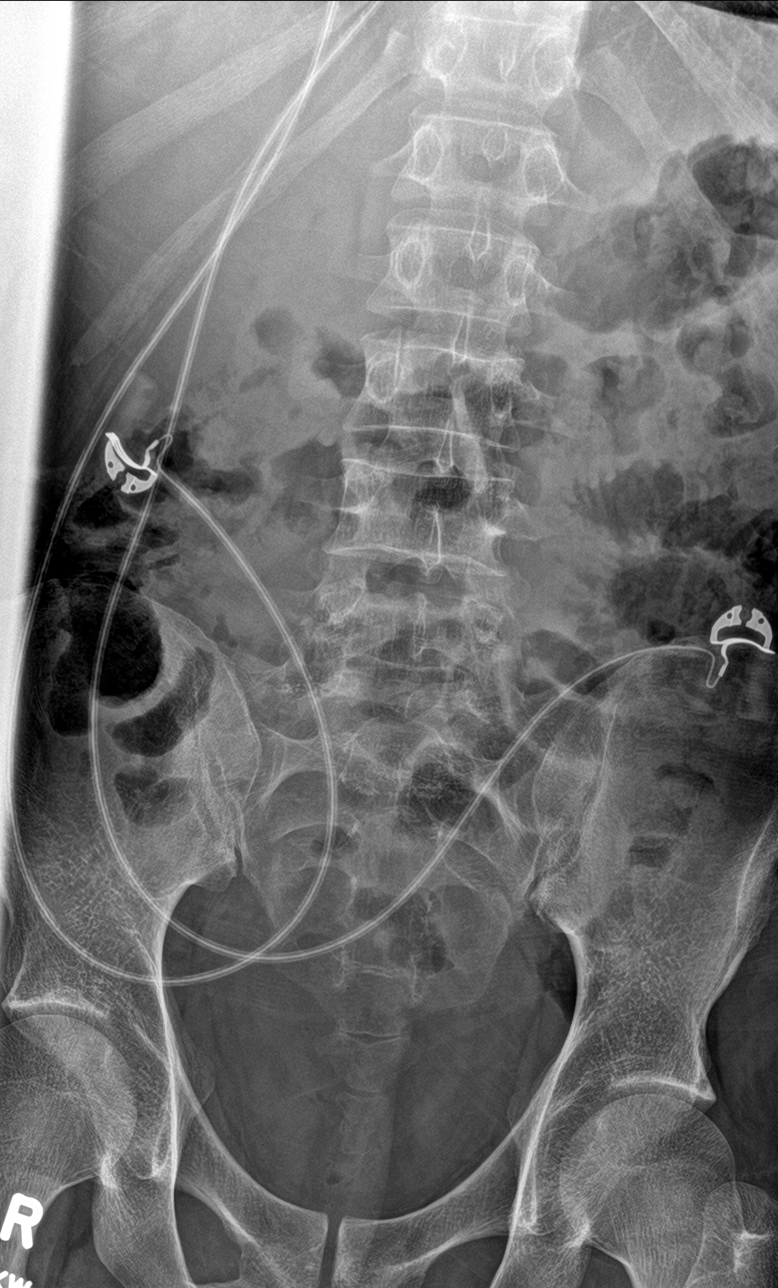

[l-spine obl (1 of 2)]
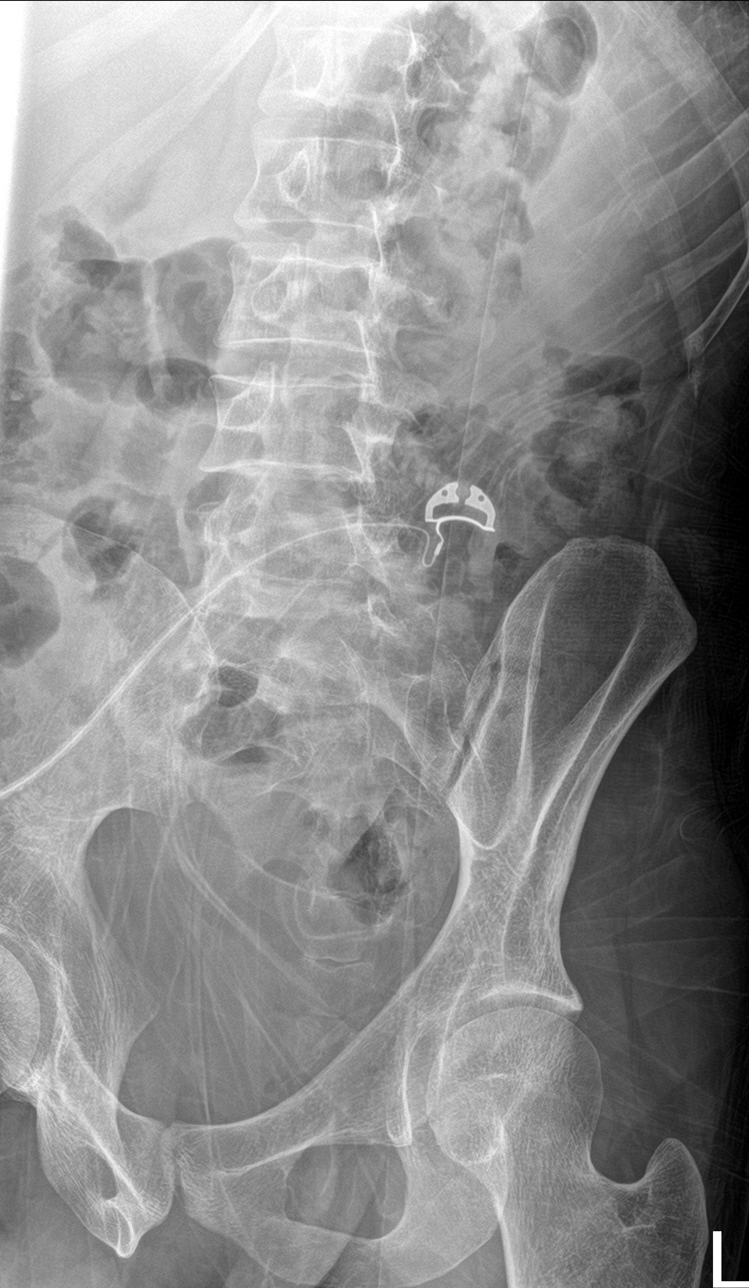

[l-spine obl (2 of 2)]
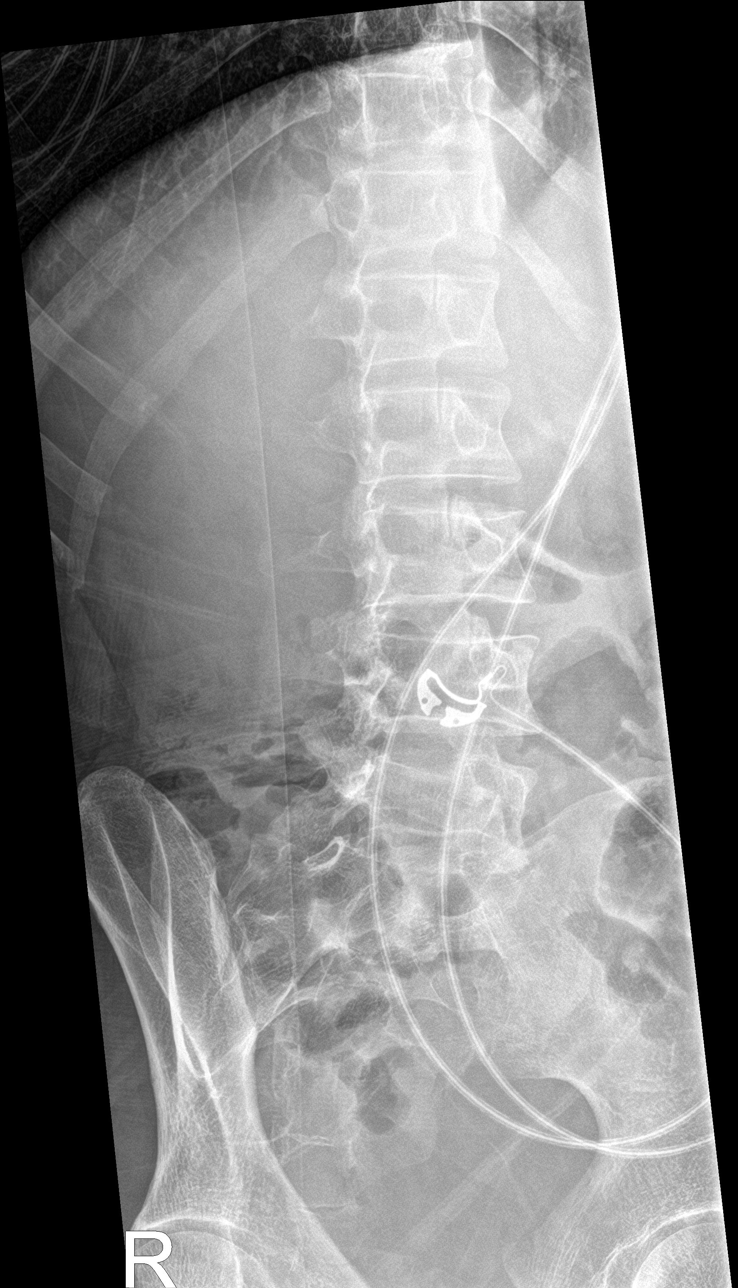

[l-spine lat]
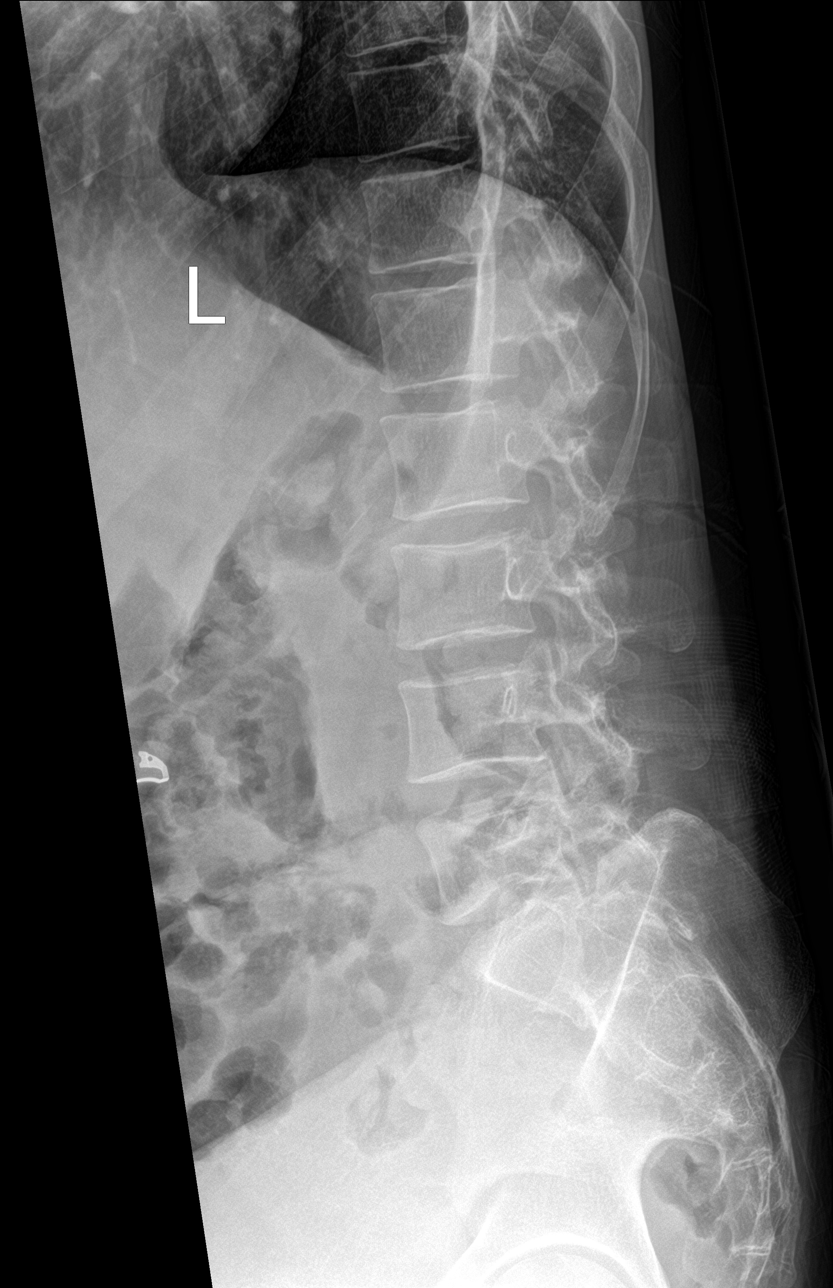

[l-spine spot]
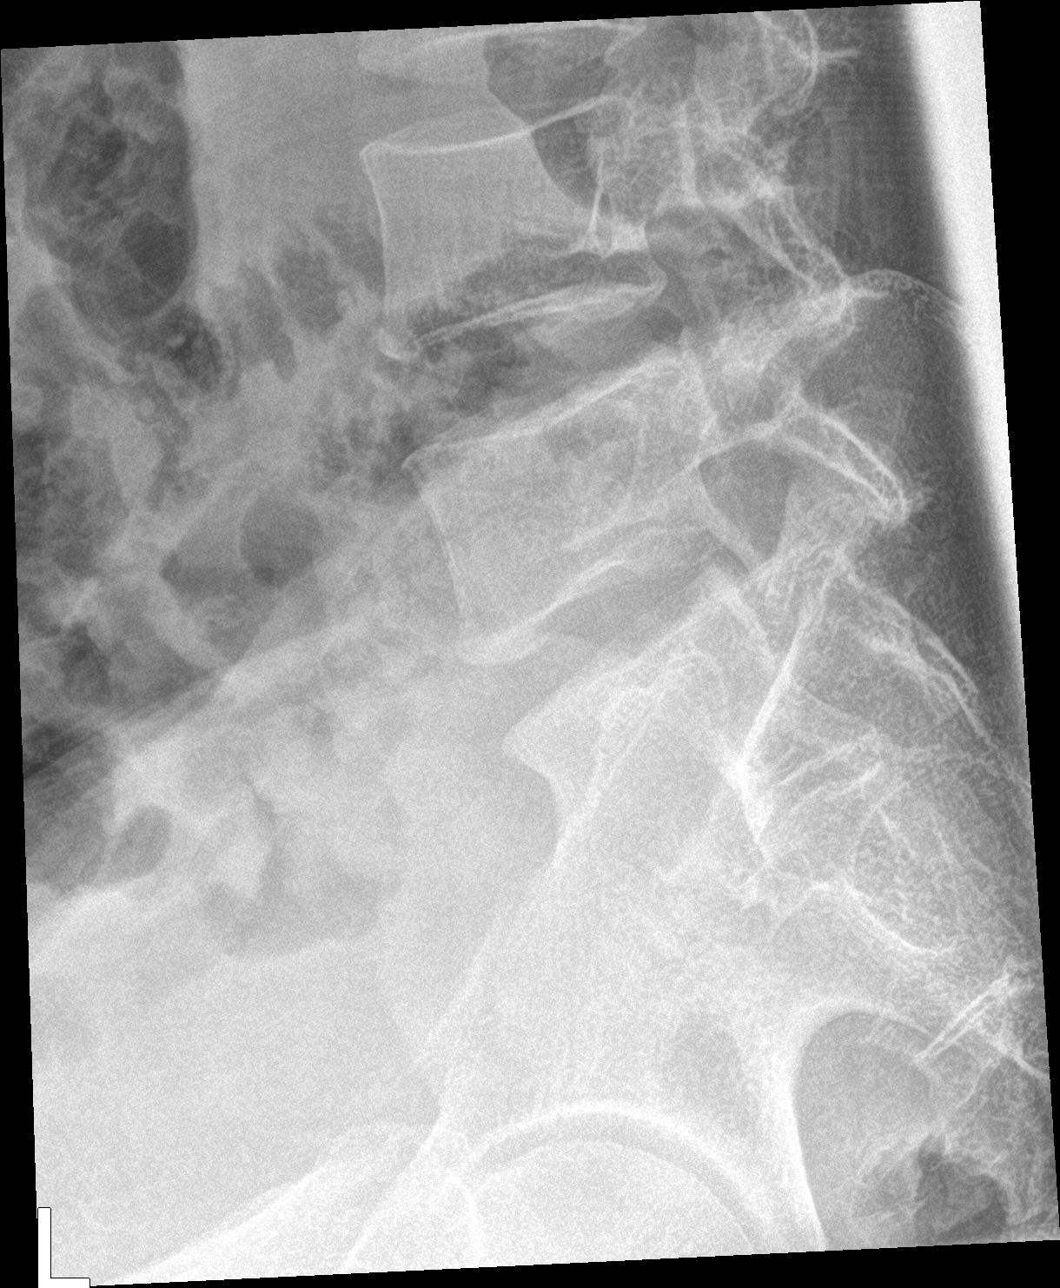

[5 of 5 positions shown; findings below may reference images not displayed]

FINDINGS: Five lumbar type vertebral bodies. Normal alignment. No compression
deformities. No focal bone lesion or bone destruction. Disc space
heights are preserved. Visualized sacrum appears intact.
IMPRESSION: Negative.

## 2023-11-29 ENCOUNTER — Ambulatory Visit: Payer: Self-pay

## 2023-11-30 ENCOUNTER — Ambulatory Visit: Admitting: Nurse Practitioner

## 2023-12-13 ENCOUNTER — Ambulatory Visit: Payer: Self-pay | Admitting: Internal Medicine

## 2023-12-18 ENCOUNTER — Ambulatory Visit (INDEPENDENT_AMBULATORY_CARE_PROVIDER_SITE_OTHER)

## 2023-12-18 VITALS — BP 116/82 | HR 111 | Temp 98.4°F | Ht 72.0 in | Wt 178.2 lb

## 2023-12-18 DIAGNOSIS — G629 Polyneuropathy, unspecified: Secondary | ICD-10-CM

## 2023-12-18 DIAGNOSIS — F1990 Other psychoactive substance use, unspecified, uncomplicated: Secondary | ICD-10-CM

## 2023-12-18 DIAGNOSIS — F419 Anxiety disorder, unspecified: Secondary | ICD-10-CM | POA: Diagnosis not present

## 2023-12-18 MED ORDER — GABAPENTIN 400 MG PO CAPS: 400.0000 mg | ORAL_CAPSULE | Freq: Three times a day (TID) | ORAL | 0 refills | Status: DC

## 2023-12-18 NOTE — Progress Notes (Signed)
 Subjective:   This visit was conducted in person. The patient gave informed consent to the use of [employer] AI technology to record the contents of the encounter as documented below.   Patient ID: Timothy Christensen, male    DOB: [birth year], 28 y.o.   MRN: [id number]   Timothy Christensen is a very pleasant 28 y.o. male who presents today as a new patient.  Past medical, surgical and family history: Reviewed and updated in chart.  Allergies: Reviewed and updated in chart.  Medications: Reviewed and updated in chart.  Social history: Reviewed and updated in chart.  Last PCP and reason for leaving: roughly a year ago, can't recall name  Discussed the use of AI scribe software for clinical note transcription with the patient, who gave verbal consent to proceed.  History of Present Illness Timothy Christensen is a 28 year old male with anxiety and nerve pain who presents for medication management.  He has a history of anxiety and nerve pain, with severe anxiety leading to agitation, difficulty sleeping, and social withdrawal. He has been diagnosed with depression since childhood but feels anxiety is more pressing. Previously, he was on Valium  and Klonopin, which were effective, but he has been off them for about three weeks, resulting in significant difficulty managing symptoms.  He is currently taking gabapentin  three times a day for nerve pain, which he attributes to nerve damage. The pain is primarily in his legs and possibly his back, with previous episodes of shooting leg pain and occasional back pain.  He has a history of substance use, including injection drug use, leading to an infection and complications. He is on Suboxone  films as part of a tapering process. He takes daily aspirin, as instructed by his doctor, but is unsure of the exact indication.  He has a history of smoking, having quit several years ago after smoking half a pack a day for one to two years. He occasionally vapes, sometimes using  nicotine  vapes. He denies current alcohol use. He is married, expecting a child, and works full-time as a chartered certified accountant.     Review of Systems  All other systems reviewed and are negative.       BP 116/82   Pulse (!) 111   Temp 98.4 F (36.9 C) (Oral)   Ht 6' (1.829 m)   Wt 178 lb 3.2 oz (80.8 kg)   SpO2 94%   BMI 24.17 kg/m   Objective:     Physical Exam GENERAL: Alert, cooperative, well developed, no acute distress. HEAD: Normocephalic atraumatic. EXTREMITIES: No cyanosis or edema. NEUROLOGICAL: Oriented to person, place and time, no gait abnormalities, moves all extremities without gross motor or sensory deficit. PSYCHIATRIC: Slightly agitated and anxious        Assessment & Plan:   Assessment & Plan Anxiety disorder and depression Polysubstance use New patient, past medical and social history thoroughly reviewed and updated in chart.  Chronic anxiety disorder with significant impact on daily functioning. Depression since childhood, anxiety more debilitating.  Extensive history of polysubstance abuse including cocaine, marijuana, tobacco.  Currently on Suboxone  which he gets through a substance abuse clinic. Anxiety previously managed with benzodiazepines, not suitable for long-term use. Off Valium  for three weeks, experiencing difficulty managing anxiety symptoms. Extensively discussed that benzodiazepines are inappropriate for long-term treatment of anxiety, discussed SSRIs with better efficacy and safety profile, patient declines.  Counseled patient that benzodiazepines will not be prescribed at this time, no concerns for acute withdrawal given that he had to  self discontinue several weeks ago due to compliance/insurance issues.  Plans to seek care elsewhere.  - Did not prescribe Valium  due to long-term use concerns. - Discussed alternative medications for anxiety management, including SSRIs, but he declined. - Encouraged follow-up with a behavioral health specialist  for ongoing management.  Chronic neuropathic pain Leg pain possibly related to past back issues. Gabapentin  effective in the past. - Refilled gabapentin  for neuropathic pain management.    Timothy Christensen [name], MD  12/18/23    Contains text generated by [employer].

## 2023-12-25 DIAGNOSIS — F419 Anxiety disorder, unspecified: Secondary | ICD-10-CM

## 2023-12-25 DIAGNOSIS — Z8679 Personal history of other diseases of the circulatory system: Secondary | ICD-10-CM

## 2024-01-04 ENCOUNTER — Ambulatory Visit: Admitting: General Practice

## 2024-01-19 MED ORDER — AMOXICILLIN 500 MG PO CAPS: 2000.0000 mg | ORAL_CAPSULE | Freq: Once | ORAL | 0 refills | Status: AC

## 2024-01-19 NOTE — Addendum Note (Signed)
 Addended by: Lamiya Naas K on: 01/19/2024 07:19 PM   Modules accepted: Orders

## 2024-01-24 ENCOUNTER — Ambulatory Visit: Attending: Internal Medicine | Admitting: Internal Medicine

## 2024-01-24 ENCOUNTER — Encounter: Payer: Self-pay | Admitting: Internal Medicine

## 2024-01-24 VITALS — BP 118/58 | HR 69 | Ht 72.0 in | Wt 191.4 lb

## 2024-01-24 DIAGNOSIS — R0989 Other specified symptoms and signs involving the circulatory and respiratory systems: Secondary | ICD-10-CM

## 2024-01-24 DIAGNOSIS — Z952 Presence of prosthetic heart valve: Secondary | ICD-10-CM | POA: Diagnosis not present

## 2024-01-24 DIAGNOSIS — R600 Localized edema: Secondary | ICD-10-CM | POA: Diagnosis not present

## 2024-01-24 DIAGNOSIS — R609 Edema, unspecified: Secondary | ICD-10-CM

## 2024-01-24 DIAGNOSIS — R0789 Other chest pain: Secondary | ICD-10-CM | POA: Diagnosis not present

## 2024-01-24 DIAGNOSIS — I059 Rheumatic mitral valve disease, unspecified: Secondary | ICD-10-CM

## 2024-01-24 DIAGNOSIS — Z8679 Personal history of other diseases of the circulatory system: Secondary | ICD-10-CM

## 2024-01-24 NOTE — Patient Instructions (Signed)
 Medication Instructions:  Your physician recommends that you continue on your current medications as directed. Please refer to the Current Medication list given to you today.   *If you need a refill on your cardiac medications before your next appointment, please call your pharmacy*  Lab Work: No labs ordered today  If you have labs (blood work) drawn today and your tests are completely normal, you will receive your results only by: MyChart Message (if you have MyChart) OR A paper copy in the mail If you have any lab test that is abnormal or we need to change your treatment, we will call you to review the results.  Testing/Procedures: Your physician has requested that you have an echocardiogram. Echocardiography is a painless test that uses sound waves to create images of your heart. It provides your doctor with information about the size and shape of your heart and how well your hearts chambers and valves are working.   You may receive an ultrasound enhancing agent through an IV if needed to better visualize your heart during the echo. This procedure takes approximately one hour.  There are no restrictions for this procedure.  This will take place at [address] [facility]) [address], [place] [address]  Please note: We ask at that you not bring children with you during ultrasound (echo/ vascular) testing. Due to room size and safety concerns, children are not allowed in the ultrasound rooms during exams. Our front office staff cannot provide observation of children in our lobby area while testing is being conducted. An adult accompanying a patient to their appointment will only be allowed in the ultrasound room at the discretion of the ultrasound technician under special circumstances. We apologize for any inconvenience.   Follow-Up: At [facility], you and your health needs are our priority.  As part of our continuing mission to provide you with exceptional heart  care, our providers are all part of one team.  This team includes your primary Cardiologist (physician) and Advanced Practice Providers or APPs (Physician Assistants and Nurse Practitioners) who all work together to provide you with the care you need, when you need it.  Your next appointment:   3 month(s)  Provider:   You may see Lonni Hanson, MD or one of the following Advanced Practice Providers on your designated Care Team:   Lonni Meager, NP Lesley Maffucci, PA-C Bernardino Bring, PA-C Cadence Sun, PA-C Tylene Lunch, NP Barnie Hila, NP    We recommend signing up for the patient portal called MyChart.  Sign up information is provided on this After Visit Summary.  MyChart is used to connect with patients for Virtual Visits (Telemedicine).  Patients are able to view lab/test results, encounter notes, upcoming appointments, etc.  Non-urgent messages can be sent to your provider as well.   To learn more about what you can do with MyChart, go to [email or web address].

## 2024-01-24 NOTE — Progress Notes (Signed)
 Cardiology Office Note:  .   Date:  01/25/2024  ID:  Timothy Christensen, DOB [birth year], MRN [id number] PCP: Bennett Reuben POUR, MD  [facility] Providers Cardiologist:  None     History of Present Illness: .   Timothy Christensen is a 28 y.o. male with history of endocarditis status post bioprosthetic MVR (33 mm Saint Jude Epic porcine bioprosthetic valve) at [facility] (patient not exactly sure of the date), possible chronic pericarditis, and polysubstance abuse.  He was hospitalized at [facility] in 2021, at which time he was noted to have severe regurgitation of his mitral and tricuspid valves with suspicion for MSSA endocarditis.  Unfortunately, he refused to consent for TEE and was ultimately discharged on oral linezolid .  He subsequently underwent mitral valve replacement at [facility] (consult note from Dr. Thera in [place], [place], on 10/21/2020 indicates valve replacement occurred on 06/13/2018, though Timothy Christensen reports it was later than this).  He most recently has followed with a cardiologist in [place], [place], having been seen within the last year (Timothy Christensen does not recall this cardiologist's name).  Timothy Christensen has been doing well from a heart standpoint though he is concerned about intermittent swelling in his hands, particularly on the right.  He denies trauma to the area.  He has not had any pretibial edema as well as chest pain, shortness of breath, palpitations, and lightheadedness.  His only other concern is of dental pain that has been present for a few months.  He is scheduled to see a dentist today.  He is already received a prescription for prophylactic antibiotics in anticipation of this visit.  He remains compliant with his current medications consisting of aspirin, Suboxone , Valium , and gabapentin .  Timothy Christensen notes some sporadic discomfort surrounding his median sternotomy incision.  He almost feels like his skin is getting caught on underlying sternotomy  wires.  This has been present ever since his valve replacement but seems to be getting less frequent.  ROS: See HPI  Studies Reviewed: SABRA   EKG Interpretation Date/Time:  Wednesday January 24 2024 11:23:05 EST Ventricular Rate:  69 PR Interval:  212 QRS Duration:  90 QT Interval:  398 QTC Calculation: 426 R Axis:   57  Text Interpretation: Sinus rhythm with 1st degree A-V block Borderline ECG When compared with ECG of 24-Oct-2019 14:56, PR interval has increased Vent. rate has decreased BY  35 BPM Confirmed by Venesha Petraitis ([id number]) on 01/25/2024 6:19:08 AM    TTE (10/25/2019): Normal LV size and wall thickness.  LVEF 45-50% with global hypokinesis.  Mildly enlarged right ventricle with normal function.  Mild RA dilation.  No pericardial effusion.  Moderate to severe mitral regurgitation.  Severe tricuspid regurgitation with apparent vegetation. Risk Assessment/Calculations:             Physical Exam:   VS:  BP (!) 118/58 (BP Location: Right Arm, Patient Position: Sitting, Cuff Size: Normal)   Pulse 69   Ht 6' (1.829 m)   Wt 191 lb 6.4 oz (86.8 kg)   SpO2 98%   BMI 25.96 kg/m    Wt Readings from Last 3 Encounters:  01/24/24 191 lb 6.4 oz (86.8 kg)  12/18/23 178 lb 3.2 oz (80.8 kg)  10/27/19 155 lb 12.8 oz (70.7 kg)    General:  NAD. Neck: No JVD or HJR. Lungs: Clear to auscultation bilaterally without wheezes or crackles. Heart: Regular rate and rhythm with 1/6 systolic murmur.  No rubs or  gallops.  Mina sternotomy incision well-healed.  Prominence of the sternum and underlying wires noted without tenderness or skin breakdown. Abdomen: Soft, nontender, nondistended. Extremities: No lower extremity edema.  Mild generalized edema of the right hand noted.  No erythema or tenderness.  ASSESSMENT AND PLAN: .    History of endocarditis status post mitral valve replacement: Timothy Christensen is status post mitral valve replacement in the setting of infective endocarditis.  He appears  euvolemic other than some right hand swelling, which is unlikely to be cardiac in nature.  Given his multiple hospitalizations that likely required multiple IVs and blood draws, it is conceivable that he has some venous insufficiency in the right arm contributing to this.  We have agreed to obtain an echocardiogram for further evaluation to ensure that his prosthetic mitral valve and previously infected tricuspid valve are functioning appropriately.  Continue aspirin 81 mg daily as well as SBE prophylaxis.  I think it is appropriate for him to proceed with dental evaluation of his tooth pain.  Sternotomy discomfort: Timothy Christensen reports rare episodes of pain at his sternotomy site, almost as if his skin is catching on the underlying sternotomy wires.  Examination today shows some prominence under the sternotomy incision in the region of sternotomy wires.  However, there is no overlying skin erythema or breakdown.  Given that his discomfort is transient, only occurs a few times a year, and seems to be getting better, we will defer testing and referral to cardiac surgery at this time.  If it becomes more bothersome, Timothy Christensen was instructed to contact us  so that we can discuss imaging and referral to CT surgery if appropriate.    Polysubstance abuse in remission: Continue Suboxone  through his substance abuse treatment clinic.    Dispo: Return to clinic in 3 months.  Signed, Lonni Hanson, MD

## 2024-01-25 ENCOUNTER — Encounter: Payer: Self-pay | Admitting: Internal Medicine

## 2024-02-12 DIAGNOSIS — F1120 Opioid dependence, uncomplicated: Secondary | ICD-10-CM | POA: Diagnosis not present

## 2024-02-27 ENCOUNTER — Ambulatory Visit: Attending: Internal Medicine

## 2024-02-27 ENCOUNTER — Ambulatory Visit: Payer: Self-pay | Admitting: Internal Medicine

## 2024-02-27 DIAGNOSIS — R600 Localized edema: Secondary | ICD-10-CM

## 2024-02-27 DIAGNOSIS — Z952 Presence of prosthetic heart valve: Secondary | ICD-10-CM

## 2024-02-27 LAB — ECHOCARDIOGRAM COMPLETE
AR max vel: 3.45 cm2
AV Area VTI: 3.42 cm2
AV Area mean vel: 3.34 cm2
AV Mean grad: 2 mmHg
AV Peak grad: 3.9 mmHg
Ao pk vel: 0.99 m/s
Area-P 1/2: 2.09 cm2
Calc EF: 42.6 %
MV VTI: 1.38 cm2
S' Lateral: 4 cm
Single Plane A2C EF: 42.6 %
Single Plane A4C EF: 45.9 %

## 2024-03-06 ENCOUNTER — Encounter: Payer: Self-pay | Admitting: Medical

## 2024-03-06 ENCOUNTER — Ambulatory Visit: Attending: Medical | Admitting: Medical

## 2024-03-06 VITALS — BP 120/78 | HR 94 | Ht 72.0 in | Wt 191.0 lb

## 2024-03-06 DIAGNOSIS — Z952 Presence of prosthetic heart valve: Secondary | ICD-10-CM

## 2024-03-06 DIAGNOSIS — F1910 Other psychoactive substance abuse, uncomplicated: Secondary | ICD-10-CM | POA: Diagnosis not present

## 2024-03-06 DIAGNOSIS — Z79899 Other long term (current) drug therapy: Secondary | ICD-10-CM | POA: Diagnosis not present

## 2024-03-06 DIAGNOSIS — R0789 Other chest pain: Secondary | ICD-10-CM | POA: Diagnosis not present

## 2024-03-06 DIAGNOSIS — I5020 Unspecified systolic (congestive) heart failure: Secondary | ICD-10-CM

## 2024-03-06 MED ORDER — METOPROLOL SUCCINATE ER 25 MG PO TB24: 12.5000 mg | ORAL_TABLET | Freq: Every day | ORAL | 3 refills | Status: AC

## 2024-03-06 MED ORDER — EMPAGLIFLOZIN 10 MG PO TABS: 10.0000 mg | ORAL_TABLET | Freq: Every day | ORAL | 3 refills | Status: AC

## 2024-03-06 NOTE — Patient Instructions (Signed)
 Medication Instructions:   Your physician recommends the following medication changes.  START TAKING: Jardiance  10 mg once daily Toprol  XL 12.5 mg once daily  *If you need a refill on your cardiac medications before your next appointment, please call your pharmacy*  Lab Work:  Your provider would like for you to have following labs drawn today Hemoglobin and Hematocrit.    If you have labs (blood work) drawn today and your tests are completely normal, you will receive your results only by:  MyChart Message (if you have MyChart) OR  A paper copy in the mail If you have any lab test that is abnormal or we need to change your treatment, we will call you to review the results.  Your provider would like for you to return in 2 weeks to have the following labs drawn: BMet.   Please go to [facility] [address] ([facility]) [address], [place] [address] You do not need an appointment.  They are open from 8 am- 4:30 pm.  Lunch from 1:00 pm- 2:00 pm You do not need to be fasting.   You may also go to one of the following [facility]:  [address] [place], [place] [address] Phone: [phone] Lab hours: Mon-Fri 8 am- 5 pm    Lunch 12 pm- 1 pm  [address] [place],  [place]  [address]  [place]  Phone: [phone] Lab hours: 7 am- 4 pm Lunch 12 pm-1 pm   [address] [place],  [place]  [address]  [place]  Phone: [phone] Lab hours: Mon-Fri 8 am- 5 pm    Lunch 12 pm- 1 pm  Testing/Procedures:    You are scheduled for Cardiac MRI at the location below.  Please arrive for your appointment at ______________ . ?  [facility] [address] [place], [place] [address] Please take advantage of the free valet parking available at the [facility] and [employer] (Entrance C).  Proceed to the [facility] Radiology Department (First Floor) for check-in.   OR   [facility] [address] [place], [place] [address] Please go to  the [facility] and check-in with the desk attendant.   Magnetic resonance imaging (MRI) is a painless test that produces images of the inside of the body without using Xrays.  During an MRI, strong magnets and radio waves work together in a data processing manager to form detailed images.   MRI images may provide more details about a medical condition than X-rays, CT scans, and ultrasounds can provide.  You may be given earphones to listen for instructions.  You may eat a light breakfast and take medications as ordered with the exception of furosemide, hydrochlorothiazide, chlorthalidone or spironolactone (or any other fluid pill). If you are undergoing a stress MRI, please avoid stimulants for 12 hr prior to test. (I.e. Caffeine, nicotine , chocolate, or antihistamine medications)  If your provider has ordered anti-anxiety medications for this test, then you will need a driver.  An IV will be inserted into one of your veins. Contrast material will be injected into your IV. It will leave your body through your urine within a day. You may be told to drink plenty of fluids to help flush the contrast material out of your system.  You will be asked to remove all metal, including: Watch, jewelry, and other metal objects including hearing aids, hair pieces and dentures. Also wearable glucose monitoring systems (ie. Freestyle Libre and Omnipods) (Braces and fillings normally are not  a problem.)   TEST WILL TAKE APPROXIMATELY 1 HOUR  PLEASE NOTIFY SCHEDULING AT LEAST 24 HOURS IN ADVANCE IF YOU ARE UNABLE TO KEEP YOUR APPOINTMENT. [phone]  For more information and frequently asked questions, please visit our website : [email or web address]  Please call the Cardiac Imaging Nurse Navigators with any questions/concerns. [phone] Office    Referrals:  None ordered at this time   Follow-Up:  At [facility], you and your health needs are our priority.  As part of our  continuing mission to provide you with exceptional heart care, our providers are all part of one team.  This team includes your primary Cardiologist (physician) and Advanced Practice Providers or APPs (Physician Assistants and Nurse Practitioners) who all work together to provide you with the care you need, when you need it.  Your next appointment:   3 month(s)  Provider:    You may see one of the following Advanced Practice Providers on your designated Care Team:   Lonni Meager, NP Lesley Maffucci, PA-C Bernardino Bring, PA-C Cadence Draper, PA-C Tylene Lunch, NP Barnie Hila, NP    We recommend signing up for the patient portal called MyChart.  Sign up information is provided on this After Visit Summary.  MyChart is used to connect with patients for Virtual Visits (Telemedicine).  Patients are able to view lab/test results, encounter notes, upcoming appointments, etc.  Non-urgent messages can be sent to your provider as well.   To learn more about what you can do with MyChart, go to [email or web address].

## 2024-03-06 NOTE — Progress Notes (Signed)
 " Cardiology Office Note   Date:  03/06/2024  ID:  Timothy Christensen, DOB Jul 04, 1995, MRN [id number] PCP: Bennett Reuben POUR, MD  [facility] Providers Cardiologist:  None   History of Present Illness Timothy Christensen is a 29 y.o. male with a h/o endocarditis s/p bioprosthetic MVR (33mm St Jude Epic porcine bioprosthetic valve) at [facility] (not sure of the date), possible chronic pericarditis, and polysubstance abuse who presents for follow-up of HFmrEF.   Patient was hospitalized at [facility] in 2021, at which time he was noted to have severe regurgitation of his mitral and tricuspid valves with suspicion for MSSA endocarditis.  Unfortunately, he refused consent to TEE and was ultimately discharged on oral flunisolide.  He subsequently underwent mitral valve replacement at [facility] (notes report this occurred 11/13/2018, although patient reported it was later than this.  He was following with a cardiologist in [place] .  Patient last saw Dr. Mady 01/24/2024 reporting sporadic discomfort around the sternotomy incision.  Echocardiogram showed LVEF 40 to 45%, normally functioning mitral valve prosthesis.  It was recommended he come in to discuss medical therapy.  Today, the patient denies shortness of breath or lower leg edema. He denies any further chest pain. He denies recent drug use. Discussion regarding reduced EF.  Studies Reviewed      Echo 02/2024  1. Left ventricular ejection fraction, by estimation, is 40 to 45%. Left  ventricular ejection fraction by 2D MOD biplane is 42.6 %. Left  ventricular ejection fraction by PLAX is 43 %. The left ventricle has  mildly decreased function. The left ventricle  demonstrates global hypokinesis. Left ventricular diastolic parameters are  indeterminate. The average left ventricular global longitudinal strain is  -15.3 %. The global longitudinal strain is abnormal.   2. Right ventricular systolic function is normal. The  right ventricular  size is normal.   3. The mitral valve has been repaired/replaced. No evidence of mitral  valve regurgitation. No evidence of mitral stenosis. The mean mitral valve  gradient is 4.0 mmHg. There is a 23 mm St. Jude bioprosthetic valve  present in the mitral position.  Procedure Date: 2022.   4. The aortic valve is tricuspid. Aortic valve regurgitation is not  visualized. No aortic stenosis is present.   5. The inferior vena cava is normal in size with greater than 50%  respiratory variability, suggesting right atrial pressure of 3 mmHg.   Echo 11/2020 CONCLUSIONS   * Left ventricular systolic function is low normal with an ejection fraction  of 54 % by Simpson's biplane.    * Left ventricular chamber size is normal.    * Left ventricular diastolic function: normal.    * Increased apical trabeculation of the left ventricle consistent with  possible non-compaction.    * Right ventricular chamber dimension is normal.    * Right ventricular systolic function is normal with TAPSE measuring 2.12  cm.   * The bioprosthetic mitral valve is structurally and functionally normal by  two-dimensional, color flow Doppler and Doppler interrogation.    * There is no regurgitation of the bioprosthetic mitral valve.    * There is trace tricuspid valve regurgitation.    * No pulmonary hypertension, estimated pulmonary arterial systolic pressure  is 25 mmHg.    * There is trace pulmonic regurgitation.    * There is no pericardial effusion.   Echo 10/2019 1. Suggest TEE for further assessment of his valves.   2. Left ventricular ejection fraction,  by estimation, is 45 to 50%. The  left ventricle has mildly decreased function. The left ventricle  demonstrates global hypokinesis. Left ventricular diastolic parameters  were normal.   3. Right ventricular systolic function is normal. The right ventricular  size is mildly enlarged.   4. Right atrial size was mildly dilated.   5. The MR  is eccentric and may be more severe than what it appears. The  valve is thickened and I cannot rule out vegetation on the mitral valve.  Suggest TEE for further assessment of his valves.      . The mitral valve is abnormal. Moderate to severe mitral valve  regurgitation.   6. There appears to be a vegetation attached to the tricupsid valve.      . The tricuspid valve is abnormal. Tricuspid valve regurgitation is  severe.   7. The aortic valve is normal in structure. Aortic valve regurgitation is  not visualized.   Echo 07/2019 1. Left ventricular ejection fraction, by estimation, is 55 to 60%. The  left ventricle has normal function. The left ventricle has no regional  wall motion abnormalities. Left ventricular diastolic parameters were  normal.   2. Right ventricular systolic function is normal. The right ventricular  size is normal.   3. Left atrial size was moderately dilated.   4. Cannot rule out vegetatoin on mitral valve on 3D images.      . The mitral valve is normal in structure. Moderate mitral valve  regurgitation. No evidence of mitral stenosis.   5. The aortic valve is normal in structure. Aortic valve regurgitation is  not visualized. No aortic stenosis is present.   6. There is mild dilatation at the level of the sinuses of Valsalva  measuring 39 mm.   7. The inferior vena cava is normal in size with greater than 50%  respiratory variability, suggesting right atrial pressure of 3 mmHg.   Echo 05/2019 Summary   1. Limited study to assess mitral valve..    2. Mild posterior mitral valve prolapse.    3. There is severe anteriorly directed mitral regurgitation.    4. The left ventricular systolic function is normal, LVEF is visually  estimated at > 55%.    5. The right ventricle is normal in size, with normal systolic function.    6. IVC size and inspiratory change suggest mildly elevated right atrial  pressure. (5-10 mmHg).    Cardiac CTA 01/2019 The coronary  artery dominance is right.   Left main: The left main coronary artery is a large-caliber vessel with no  evidence of plaque or stenosis.   LAD: The left anterior descending artery is patent with no evidence of  plaque or stenosis. It gives off 1 large, branching patent diagonal  branch.   Ramus intermedius: The ramus intermedius is a small-caliber vessel with no  evidence of proximal plaque or stenosis.   LCX: The left circumflex artery is patent with no evidence of plaque or  stenosis. It gives off 3 patent obtuse marginal branches.   RCA: The right coronary artery is patent with no evidence of plaque or  stenosis. It gives off a patent posterior descending artery and a patent  posterior left ventricular branch.   Cardiac valves: The mitral valve is qualitatively thickened. There is no  thickening or calcification of the aortic valve.   Aorta / Pulmonary arteries / Pulmonary veins: Qualitatively the aorta and  pulmonary arteries are normal in size and configuration.  There are 2  right and 2 left pulmonary veins.  There is a small left atrial  diverticulum close to the superior vena cava   Pericardium: The pericardial contour is preserved with no effusion,  thickening or calcifications.   Extra-cardiac findings: There are no significant extra-cardiac findings in  the available limited views of the lungs and mediastinum.   Echo 01/2019 Summary   1. Upper normal left ventricular size with an ejection fraction of  approximately 55%.    2. Normal right ventricular size and systolic function.    3. Dilated left atrium - mildly dilated.    4. Mitral valve prolapse.    5. Mitral regurgitation - moderate to severe.  TEE 01/2019 Summary   1. Severe degenerative mitral valve disease with flail mitral P1 leaflet and  mild P2 leaflet prolapse.    2. Moderate to severe, eccentric, anteriorly-directed mitral regurgitation.    3. No obvious valvular vegetations.    4. Normal left  ventricular systolic function.    5. Dilated left atrium.    6. Normal right ventricular size and systolic function.   Echo 12/2018 Summary   1. No apparent valvular vegetations noted.    2. Normal left ventricular size and systolic function, ejection fraction > 55%.   3. Mitral regurgitation - severe.    4. There is severe mitral valve regurgitation by color flow Doppler imaging.  The MR is eccentric and wrapping around the LA wall. This appears similar to  slightly worse compared to prior (11/23/18).    5. Dilated left atrium - moderately dilated.    6. Normal right ventricular size and systolic function.   Echo 11/2018 Summary   1. Normal left ventricular size and systolic function, ejection fraction  60-65%.   2. Degenerative mitral valve disease - mildly thickened.    3. Mitral regurgitation - moderate, eccentric jet that wraps around the LA  wall. While there is no obvious valvular vegetation noted, in the right  clinical context this would be suggestive of endocarditis. Suggest TEE for  better visualization of the mitral valve apparatus.    4. Dilated left atrium - mildly dilated.    5. Normal right ventricular size and systolic function.   TEE 11/2018 Summary   1. Normal left ventricular systolic function.    2. Probable mitral valve vegetation.    3. Mitral regurgitation - moderate.   Echo 11/2018 Summary   1. No apparent valvular vegetations.    2. Normal left ventricular size and systolic function, ejection fraction  55-60%.   3. Normal right ventricular size and systolic function.    4. Mitral regurgitation - mild to moderate.    5. No evidence of valvular or endocardial vegetation.      Physical Exam VS:  There were no vitals taken for this visit.       Wt Readings from Last 3 Encounters:  01/24/24 191 lb 6.4 oz (86.8 kg)  12/18/23 178 lb 3.2 oz (80.8 kg)  10/27/19 155 lb 12.8 oz (70.7 kg)    GEN: Well nourished, well developed in no acute  distress NECK: No JVD; No carotid bruits CARDIAC: RRR, + murmur, no rubs, gallops RESPIRATORY:  Clear to auscultation without rales, wheezing or rhonchi  ABDOMEN: Soft, non-tender, non-distended EXTREMITIES:  No edema; No deformity   ASSESSMENT AND PLAN  HFmrEF Recent echo showed LVEF 40-45%. In review echo 07/2019 showed LVEF 55-60%, mod MR and echo  10/2019 showed EF 45-50% in the setting of  mod to severe  mitral valve disease. He denies chest pain or SOB. He is euvolemic on exam. I will order a  stress cardiac MRI. I will start Jardiance  10mg  daily and Toprol  12.5mg  daily. BMET in 2 weeks. Patient is wanting to come in only if necessary. We will add on GDMT as able. Plan to repeat echo in 2-3 months.  S/p MVR Most recent echo showed normally functioning valve.  Sternotomy discomfort This has resolved. Stress cardiac MRI as above.  Poly substance use He is on suboxone . He denies recent drug use.    Dispo: Follow-up in 3 months  Signed, Jenni Thew VEAR Fishman, PA-C   "

## 2024-03-07 ENCOUNTER — Ambulatory Visit: Payer: Self-pay | Admitting: Medical

## 2024-03-07 LAB — HEMOGLOBIN AND HEMATOCRIT, BLOOD
Hematocrit: 43.7 % (ref 37.5–51.0)
Hemoglobin: 15 g/dL (ref 13.0–17.7)

## 2024-03-12 ENCOUNTER — Other Ambulatory Visit: Payer: Self-pay

## 2024-03-12 DIAGNOSIS — G629 Polyneuropathy, unspecified: Secondary | ICD-10-CM

## 2024-03-12 NOTE — Telephone Encounter (Signed)
 Pt also sent a MyChart message asking for a refill. I asked him if he has set up with a different provider as the only OV Note with Dr Bennett stated he was going to look for care elsewhere. I advised that if he has not, he needs an OV here in the few weeks.   Last filled 12-18-23 #270 Last OV 12-18-23      Next OV No Future OV Pharmacy [facility]

## 2024-04-01 ENCOUNTER — Ambulatory Visit: Payer: Self-pay | Admitting: Psychiatry

## 2024-04-03 ENCOUNTER — Ambulatory Visit (HOSPITAL_COMMUNITY)

## 2024-05-02 ENCOUNTER — Ambulatory Visit: Admitting: Internal Medicine
